# Patient Record
Sex: Female | Born: 1955 | Race: White | Hispanic: No | Smoking: Former smoker
Health system: Southern US, Community
[De-identification: ages and names within clinical notes are randomized; demographics above are authoritative.]

## PROBLEM LIST (undated history)

## (undated) DIAGNOSIS — K219 Gastro-esophageal reflux disease without esophagitis: Secondary | ICD-10-CM

## (undated) DIAGNOSIS — K589 Irritable bowel syndrome without diarrhea: Secondary | ICD-10-CM

## (undated) DIAGNOSIS — E7210 Disorders of sulfur-bearing amino-acid metabolism, unspecified: Secondary | ICD-10-CM

## (undated) DIAGNOSIS — J309 Allergic rhinitis, unspecified: Secondary | ICD-10-CM

## (undated) DIAGNOSIS — I10 Essential (primary) hypertension: Secondary | ICD-10-CM

## (undated) DIAGNOSIS — F988 Other specified behavioral and emotional disorders with onset usually occurring in childhood and adolescence: Secondary | ICD-10-CM

## (undated) DIAGNOSIS — R911 Solitary pulmonary nodule: Secondary | ICD-10-CM

## (undated) HISTORY — DX: Gastro-esophageal reflux disease without esophagitis: K21.9

## (undated) HISTORY — DX: Solitary pulmonary nodule: R91.1

## (undated) HISTORY — DX: Other specified behavioral and emotional disorders with onset usually occurring in childhood and adolescence: F98.8

## (undated) HISTORY — PX: ABDOMINAL HYSTERECTOMY: SHX81

## (undated) HISTORY — DX: Irritable bowel syndrome, unspecified: K58.9

## (undated) HISTORY — DX: Disorders of sulfur-bearing amino-acid metabolism, unspecified: E72.10

## (undated) HISTORY — DX: Essential (primary) hypertension: I10

## (undated) HISTORY — DX: Allergic rhinitis, unspecified: J30.9

---

## 1999-08-12 HISTORY — PX: OTHER SURGICAL HISTORY: SHX169

## 1999-11-28 ENCOUNTER — Other Ambulatory Visit: Admission: RE | Admit: 1999-11-28 | Discharge: 1999-11-28 | Payer: Self-pay | Admitting: Internal Medicine

## 1999-11-28 ENCOUNTER — Emergency Department (HOSPITAL_COMMUNITY): Admission: EM | Admit: 1999-11-28 | Discharge: 1999-11-28 | Payer: Self-pay | Admitting: Emergency Medicine

## 1999-11-28 ENCOUNTER — Encounter (INDEPENDENT_AMBULATORY_CARE_PROVIDER_SITE_OTHER): Payer: Self-pay | Admitting: Specialist

## 1999-11-28 ENCOUNTER — Encounter: Payer: Self-pay | Admitting: Internal Medicine

## 2000-12-23 ENCOUNTER — Emergency Department (HOSPITAL_COMMUNITY): Admission: EM | Admit: 2000-12-23 | Discharge: 2000-12-23 | Payer: Self-pay | Admitting: Emergency Medicine

## 2000-12-23 ENCOUNTER — Encounter: Payer: Self-pay | Admitting: Emergency Medicine

## 2003-07-31 ENCOUNTER — Other Ambulatory Visit: Admission: RE | Admit: 2003-07-31 | Discharge: 2003-07-31 | Payer: Self-pay | Admitting: Family Medicine

## 2003-07-31 ENCOUNTER — Encounter: Payer: Self-pay | Admitting: Internal Medicine

## 2003-08-09 ENCOUNTER — Ambulatory Visit (HOSPITAL_COMMUNITY): Admission: RE | Admit: 2003-08-09 | Discharge: 2003-08-09 | Payer: Self-pay | Admitting: Family Medicine

## 2003-08-14 ENCOUNTER — Emergency Department (HOSPITAL_COMMUNITY): Admission: EM | Admit: 2003-08-14 | Discharge: 2003-08-14 | Payer: Self-pay | Admitting: Emergency Medicine

## 2003-08-15 ENCOUNTER — Ambulatory Visit (HOSPITAL_COMMUNITY): Admission: RE | Admit: 2003-08-15 | Discharge: 2003-08-15 | Payer: Self-pay | Admitting: Family Medicine

## 2003-09-06 ENCOUNTER — Encounter: Admission: RE | Admit: 2003-09-06 | Discharge: 2003-09-06 | Payer: Self-pay | Admitting: Family Medicine

## 2004-11-27 ENCOUNTER — Ambulatory Visit: Payer: Self-pay | Admitting: Family Medicine

## 2004-12-17 ENCOUNTER — Ambulatory Visit: Payer: Self-pay | Admitting: Family Medicine

## 2004-12-17 ENCOUNTER — Other Ambulatory Visit: Admission: RE | Admit: 2004-12-17 | Discharge: 2004-12-17 | Payer: Self-pay | Admitting: Family Medicine

## 2004-12-19 LAB — CONVERTED CEMR LAB: Pap Smear: NORMAL

## 2005-03-11 ENCOUNTER — Ambulatory Visit: Payer: Self-pay | Admitting: Family Medicine

## 2005-04-07 ENCOUNTER — Ambulatory Visit: Payer: Self-pay | Admitting: Internal Medicine

## 2005-11-17 ENCOUNTER — Ambulatory Visit: Payer: Self-pay | Admitting: Family Medicine

## 2006-01-24 ENCOUNTER — Ambulatory Visit: Payer: Self-pay | Admitting: Family Medicine

## 2006-08-07 ENCOUNTER — Ambulatory Visit: Payer: Self-pay | Admitting: Internal Medicine

## 2006-08-20 ENCOUNTER — Ambulatory Visit: Payer: Self-pay | Admitting: Internal Medicine

## 2006-08-20 LAB — CONVERTED CEMR LAB
Chloride: 102 meq/L (ref 96–112)
Potassium: 4.2 meq/L (ref 3.5–5.3)
Sodium: 140 meq/L (ref 135–145)

## 2006-09-04 ENCOUNTER — Ambulatory Visit: Payer: Self-pay | Admitting: Internal Medicine

## 2006-09-18 ENCOUNTER — Ambulatory Visit: Payer: Self-pay | Admitting: Internal Medicine

## 2006-12-09 DIAGNOSIS — E7210 Disorders of sulfur-bearing amino-acid metabolism, unspecified: Secondary | ICD-10-CM | POA: Insufficient documentation

## 2006-12-09 DIAGNOSIS — K589 Irritable bowel syndrome without diarrhea: Secondary | ICD-10-CM | POA: Insufficient documentation

## 2006-12-09 DIAGNOSIS — K219 Gastro-esophageal reflux disease without esophagitis: Secondary | ICD-10-CM | POA: Insufficient documentation

## 2006-12-09 DIAGNOSIS — J309 Allergic rhinitis, unspecified: Secondary | ICD-10-CM | POA: Insufficient documentation

## 2006-12-09 DIAGNOSIS — I10 Essential (primary) hypertension: Secondary | ICD-10-CM | POA: Insufficient documentation

## 2006-12-11 ENCOUNTER — Ambulatory Visit: Payer: Self-pay | Admitting: Internal Medicine

## 2007-01-11 ENCOUNTER — Telehealth (INDEPENDENT_AMBULATORY_CARE_PROVIDER_SITE_OTHER): Payer: Self-pay | Admitting: *Deleted

## 2007-03-08 ENCOUNTER — Telehealth (INDEPENDENT_AMBULATORY_CARE_PROVIDER_SITE_OTHER): Payer: Self-pay | Admitting: *Deleted

## 2007-04-13 ENCOUNTER — Telehealth: Payer: Self-pay | Admitting: Internal Medicine

## 2007-04-14 ENCOUNTER — Ambulatory Visit: Payer: Self-pay | Admitting: Internal Medicine

## 2007-04-14 DIAGNOSIS — F988 Other specified behavioral and emotional disorders with onset usually occurring in childhood and adolescence: Secondary | ICD-10-CM | POA: Insufficient documentation

## 2007-05-19 ENCOUNTER — Telehealth (INDEPENDENT_AMBULATORY_CARE_PROVIDER_SITE_OTHER): Payer: Self-pay | Admitting: *Deleted

## 2007-06-29 ENCOUNTER — Telehealth (INDEPENDENT_AMBULATORY_CARE_PROVIDER_SITE_OTHER): Payer: Self-pay | Admitting: *Deleted

## 2007-07-01 ENCOUNTER — Telehealth (INDEPENDENT_AMBULATORY_CARE_PROVIDER_SITE_OTHER): Payer: Self-pay | Admitting: *Deleted

## 2007-08-06 ENCOUNTER — Telehealth (INDEPENDENT_AMBULATORY_CARE_PROVIDER_SITE_OTHER): Payer: Self-pay | Admitting: *Deleted

## 2007-08-13 ENCOUNTER — Telehealth: Payer: Self-pay | Admitting: Internal Medicine

## 2007-08-25 ENCOUNTER — Telehealth (INDEPENDENT_AMBULATORY_CARE_PROVIDER_SITE_OTHER): Payer: Self-pay | Admitting: *Deleted

## 2007-09-17 ENCOUNTER — Telehealth (INDEPENDENT_AMBULATORY_CARE_PROVIDER_SITE_OTHER): Payer: Self-pay | Admitting: *Deleted

## 2007-10-05 ENCOUNTER — Encounter (INDEPENDENT_AMBULATORY_CARE_PROVIDER_SITE_OTHER): Payer: Self-pay | Admitting: *Deleted

## 2007-10-05 ENCOUNTER — Telehealth (INDEPENDENT_AMBULATORY_CARE_PROVIDER_SITE_OTHER): Payer: Self-pay | Admitting: *Deleted

## 2007-10-12 ENCOUNTER — Telehealth (INDEPENDENT_AMBULATORY_CARE_PROVIDER_SITE_OTHER): Payer: Self-pay | Admitting: *Deleted

## 2007-11-01 ENCOUNTER — Encounter: Payer: Self-pay | Admitting: Internal Medicine

## 2007-11-01 ENCOUNTER — Telehealth (INDEPENDENT_AMBULATORY_CARE_PROVIDER_SITE_OTHER): Payer: Self-pay | Admitting: *Deleted

## 2007-11-09 ENCOUNTER — Telehealth (INDEPENDENT_AMBULATORY_CARE_PROVIDER_SITE_OTHER): Payer: Self-pay | Admitting: *Deleted

## 2007-11-12 ENCOUNTER — Telehealth (INDEPENDENT_AMBULATORY_CARE_PROVIDER_SITE_OTHER): Payer: Self-pay | Admitting: *Deleted

## 2007-11-15 ENCOUNTER — Telehealth (INDEPENDENT_AMBULATORY_CARE_PROVIDER_SITE_OTHER): Payer: Self-pay | Admitting: *Deleted

## 2007-11-29 ENCOUNTER — Ambulatory Visit: Payer: Self-pay | Admitting: Internal Medicine

## 2007-11-29 ENCOUNTER — Other Ambulatory Visit: Admission: RE | Admit: 2007-11-29 | Discharge: 2007-11-29 | Payer: Self-pay | Admitting: Internal Medicine

## 2007-11-29 ENCOUNTER — Encounter: Payer: Self-pay | Admitting: Internal Medicine

## 2007-11-29 LAB — CONVERTED CEMR LAB: Pap Smear: NORMAL

## 2007-11-30 ENCOUNTER — Encounter (INDEPENDENT_AMBULATORY_CARE_PROVIDER_SITE_OTHER): Payer: Self-pay | Admitting: *Deleted

## 2007-12-03 ENCOUNTER — Ambulatory Visit: Payer: Self-pay | Admitting: Internal Medicine

## 2007-12-06 ENCOUNTER — Encounter (INDEPENDENT_AMBULATORY_CARE_PROVIDER_SITE_OTHER): Payer: Self-pay | Admitting: *Deleted

## 2007-12-13 ENCOUNTER — Encounter (INDEPENDENT_AMBULATORY_CARE_PROVIDER_SITE_OTHER): Payer: Self-pay | Admitting: *Deleted

## 2008-01-05 ENCOUNTER — Encounter: Payer: Self-pay | Admitting: Internal Medicine

## 2008-01-05 ENCOUNTER — Ambulatory Visit: Payer: Self-pay | Admitting: Internal Medicine

## 2008-01-07 ENCOUNTER — Telehealth: Payer: Self-pay | Admitting: Internal Medicine

## 2008-01-11 ENCOUNTER — Telehealth (INDEPENDENT_AMBULATORY_CARE_PROVIDER_SITE_OTHER): Payer: Self-pay | Admitting: *Deleted

## 2008-01-12 ENCOUNTER — Encounter: Payer: Self-pay | Admitting: Internal Medicine

## 2008-01-13 ENCOUNTER — Telehealth: Payer: Self-pay | Admitting: Internal Medicine

## 2008-01-17 ENCOUNTER — Telehealth: Payer: Self-pay | Admitting: Internal Medicine

## 2008-01-17 ENCOUNTER — Encounter: Payer: Self-pay | Admitting: Internal Medicine

## 2008-01-18 ENCOUNTER — Encounter (INDEPENDENT_AMBULATORY_CARE_PROVIDER_SITE_OTHER): Payer: Self-pay | Admitting: *Deleted

## 2008-01-28 ENCOUNTER — Encounter: Payer: Self-pay | Admitting: Internal Medicine

## 2008-01-28 ENCOUNTER — Ambulatory Visit: Payer: Self-pay

## 2008-02-08 ENCOUNTER — Encounter: Payer: Self-pay | Admitting: Internal Medicine

## 2008-02-24 ENCOUNTER — Encounter: Payer: Self-pay | Admitting: Internal Medicine

## 2008-02-29 ENCOUNTER — Telehealth (INDEPENDENT_AMBULATORY_CARE_PROVIDER_SITE_OTHER): Payer: Self-pay | Admitting: *Deleted

## 2008-03-30 ENCOUNTER — Ambulatory Visit: Payer: Self-pay | Admitting: Internal Medicine

## 2008-03-30 DIAGNOSIS — L0292 Furuncle, unspecified: Secondary | ICD-10-CM | POA: Insufficient documentation

## 2008-03-30 DIAGNOSIS — L0293 Carbuncle, unspecified: Secondary | ICD-10-CM

## 2008-03-31 ENCOUNTER — Encounter: Payer: Self-pay | Admitting: Internal Medicine

## 2008-03-31 ENCOUNTER — Ambulatory Visit: Payer: Self-pay | Admitting: Internal Medicine

## 2008-04-03 ENCOUNTER — Telehealth: Payer: Self-pay | Admitting: Internal Medicine

## 2008-04-10 ENCOUNTER — Telehealth (INDEPENDENT_AMBULATORY_CARE_PROVIDER_SITE_OTHER): Payer: Self-pay | Admitting: *Deleted

## 2008-05-12 ENCOUNTER — Telehealth (INDEPENDENT_AMBULATORY_CARE_PROVIDER_SITE_OTHER): Payer: Self-pay | Admitting: *Deleted

## 2008-06-23 ENCOUNTER — Telehealth (INDEPENDENT_AMBULATORY_CARE_PROVIDER_SITE_OTHER): Payer: Self-pay | Admitting: *Deleted

## 2008-07-31 ENCOUNTER — Telehealth (INDEPENDENT_AMBULATORY_CARE_PROVIDER_SITE_OTHER): Payer: Self-pay | Admitting: *Deleted

## 2008-08-18 ENCOUNTER — Telehealth (INDEPENDENT_AMBULATORY_CARE_PROVIDER_SITE_OTHER): Payer: Self-pay | Admitting: *Deleted

## 2008-08-28 ENCOUNTER — Ambulatory Visit: Payer: Self-pay | Admitting: Internal Medicine

## 2008-10-10 ENCOUNTER — Encounter (INDEPENDENT_AMBULATORY_CARE_PROVIDER_SITE_OTHER): Payer: Self-pay | Admitting: *Deleted

## 2008-10-11 ENCOUNTER — Telehealth: Payer: Self-pay | Admitting: Internal Medicine

## 2008-10-23 ENCOUNTER — Ambulatory Visit: Payer: Self-pay | Admitting: Internal Medicine

## 2008-10-23 DIAGNOSIS — J019 Acute sinusitis, unspecified: Secondary | ICD-10-CM | POA: Insufficient documentation

## 2008-10-23 DIAGNOSIS — H6690 Otitis media, unspecified, unspecified ear: Secondary | ICD-10-CM | POA: Insufficient documentation

## 2008-10-24 ENCOUNTER — Encounter (INDEPENDENT_AMBULATORY_CARE_PROVIDER_SITE_OTHER): Payer: Self-pay | Admitting: *Deleted

## 2008-10-25 ENCOUNTER — Telehealth (INDEPENDENT_AMBULATORY_CARE_PROVIDER_SITE_OTHER): Payer: Self-pay | Admitting: *Deleted

## 2008-10-26 ENCOUNTER — Ambulatory Visit: Payer: Self-pay | Admitting: Family Medicine

## 2008-11-01 ENCOUNTER — Ambulatory Visit: Payer: Self-pay | Admitting: Internal Medicine

## 2008-11-08 ENCOUNTER — Ambulatory Visit: Payer: Self-pay | Admitting: Internal Medicine

## 2008-12-03 ENCOUNTER — Inpatient Hospital Stay (HOSPITAL_COMMUNITY): Admission: EM | Admit: 2008-12-03 | Discharge: 2008-12-06 | Payer: Self-pay | Admitting: Emergency Medicine

## 2008-12-03 ENCOUNTER — Ambulatory Visit: Payer: Self-pay | Admitting: Internal Medicine

## 2008-12-06 ENCOUNTER — Ambulatory Visit: Payer: Self-pay | Admitting: Vascular Surgery

## 2008-12-06 ENCOUNTER — Encounter: Payer: Self-pay | Admitting: Internal Medicine

## 2008-12-08 ENCOUNTER — Ambulatory Visit: Payer: Self-pay | Admitting: Family Medicine

## 2008-12-08 DIAGNOSIS — J029 Acute pharyngitis, unspecified: Secondary | ICD-10-CM | POA: Insufficient documentation

## 2008-12-09 ENCOUNTER — Encounter: Payer: Self-pay | Admitting: Family Medicine

## 2008-12-12 ENCOUNTER — Telehealth (INDEPENDENT_AMBULATORY_CARE_PROVIDER_SITE_OTHER): Payer: Self-pay | Admitting: *Deleted

## 2009-01-19 ENCOUNTER — Telehealth (INDEPENDENT_AMBULATORY_CARE_PROVIDER_SITE_OTHER): Payer: Self-pay | Admitting: *Deleted

## 2009-02-20 ENCOUNTER — Telehealth (INDEPENDENT_AMBULATORY_CARE_PROVIDER_SITE_OTHER): Payer: Self-pay | Admitting: *Deleted

## 2009-02-22 ENCOUNTER — Telehealth (INDEPENDENT_AMBULATORY_CARE_PROVIDER_SITE_OTHER): Payer: Self-pay | Admitting: *Deleted

## 2009-02-26 ENCOUNTER — Encounter: Payer: Self-pay | Admitting: Internal Medicine

## 2009-05-09 ENCOUNTER — Ambulatory Visit: Payer: Self-pay | Admitting: Internal Medicine

## 2009-05-09 DIAGNOSIS — R300 Dysuria: Secondary | ICD-10-CM | POA: Insufficient documentation

## 2009-05-09 DIAGNOSIS — N390 Urinary tract infection, site not specified: Secondary | ICD-10-CM | POA: Insufficient documentation

## 2009-05-09 LAB — CONVERTED CEMR LAB
Blood in Urine, dipstick: NEGATIVE
Ketones, urine, test strip: NEGATIVE
Nitrite: NEGATIVE
pH: 5

## 2009-05-14 ENCOUNTER — Ambulatory Visit: Payer: Self-pay | Admitting: Internal Medicine

## 2009-05-14 LAB — CONVERTED CEMR LAB
Albumin: 4.1 g/dL (ref 3.5–5.2)
BUN: 8 mg/dL (ref 6–23)
Basophils Absolute: 0 10*3/uL (ref 0.0–0.1)
CO2: 33 meq/L — ABNORMAL HIGH (ref 19–32)
Chloride: 105 meq/L (ref 96–112)
Cholesterol: 196 mg/dL (ref 0–200)
Glucose, Bld: 77 mg/dL (ref 70–99)
HCT: 41.1 % (ref 36.0–46.0)
HDL: 85.7 mg/dL (ref >39.00)
Hemoglobin, Urine: NEGATIVE
Lymphs Abs: 1.4 10*3/uL (ref 0.7–4.0)
MCHC: 34.3 g/dL (ref 30.0–36.0)
MCV: 90.9 fL (ref 78.0–100.0)
Monocytes Absolute: 0.4 10*3/uL (ref 0.1–1.0)
Platelets: 250 10*3/uL (ref 150.0–400.0)
Potassium: 3.3 meq/L — ABNORMAL LOW (ref 3.5–5.1)
RDW: 13.7 % (ref 11.5–14.6)
TSH: 1.53 microintl units/mL (ref 0.35–5.50)
Total Bilirubin: 0.7 mg/dL (ref 0.3–1.2)
Urine Glucose: NEGATIVE mg/dL
VLDL: 15 mg/dL (ref 0.0–40.0)
pH: 6 (ref 5.0–8.0)

## 2009-07-02 ENCOUNTER — Telehealth: Payer: Self-pay | Admitting: Internal Medicine

## 2009-07-03 ENCOUNTER — Telehealth: Payer: Self-pay | Admitting: Internal Medicine

## 2009-07-09 ENCOUNTER — Ambulatory Visit: Payer: Self-pay | Admitting: Internal Medicine

## 2009-07-31 ENCOUNTER — Telehealth: Payer: Self-pay | Admitting: Internal Medicine

## 2009-09-03 ENCOUNTER — Telehealth: Payer: Self-pay | Admitting: Internal Medicine

## 2009-09-10 ENCOUNTER — Encounter (INDEPENDENT_AMBULATORY_CARE_PROVIDER_SITE_OTHER): Payer: Self-pay | Admitting: *Deleted

## 2009-09-25 ENCOUNTER — Telehealth: Payer: Self-pay | Admitting: Internal Medicine

## 2009-10-08 ENCOUNTER — Telehealth: Payer: Self-pay | Admitting: Internal Medicine

## 2009-12-06 ENCOUNTER — Telehealth: Payer: Self-pay | Admitting: Internal Medicine

## 2009-12-28 ENCOUNTER — Ambulatory Visit: Payer: Self-pay | Admitting: Internal Medicine

## 2009-12-28 DIAGNOSIS — R059 Cough, unspecified: Secondary | ICD-10-CM | POA: Insufficient documentation

## 2009-12-28 DIAGNOSIS — R05 Cough: Secondary | ICD-10-CM

## 2010-01-29 ENCOUNTER — Telehealth: Payer: Self-pay | Admitting: Internal Medicine

## 2010-02-01 ENCOUNTER — Telehealth: Payer: Self-pay | Admitting: Internal Medicine

## 2010-03-20 ENCOUNTER — Telehealth: Payer: Self-pay | Admitting: Internal Medicine

## 2010-05-09 ENCOUNTER — Telehealth: Payer: Self-pay | Admitting: Internal Medicine

## 2010-05-10 ENCOUNTER — Telehealth: Payer: Self-pay | Admitting: Internal Medicine

## 2010-05-20 ENCOUNTER — Ambulatory Visit: Payer: Self-pay | Admitting: Internal Medicine

## 2010-05-20 LAB — CONVERTED CEMR LAB
Albumin: 3.7 g/dL (ref 3.5–5.2)
Basophils Relative: 0.5 % (ref 0.0–3.0)
CO2: 30 meq/L (ref 19–32)
Chloride: 104 meq/L (ref 96–112)
Cholesterol: 189 mg/dL (ref 0–200)
Eosinophils Absolute: 0.1 10*3/uL (ref 0.0–0.7)
Glucose, Bld: 78 mg/dL (ref 70–99)
HDL: 75.9 mg/dL (ref >39.00)
Hemoglobin, Urine: NEGATIVE
Hemoglobin: 12.8 g/dL (ref 12.0–15.0)
LDL Cholesterol: 101 mg/dL — ABNORMAL HIGH (ref 0–99)
Lymphs Abs: 1.4 10*3/uL (ref 0.7–4.0)
MCHC: 34.5 g/dL (ref 30.0–36.0)
MCV: 88.5 fL (ref 78.0–100.0)
Monocytes Absolute: 0.5 10*3/uL (ref 0.1–1.0)
Neutro Abs: 3.3 10*3/uL (ref 1.4–7.7)
Nitrite: NEGATIVE
RBC: 4.17 M/uL (ref 3.87–5.11)
Sodium: 141 meq/L (ref 135–145)
TSH: 1.06 microintl units/mL (ref 0.35–5.50)
Total CHOL/HDL Ratio: 2
Total Protein: 6.6 g/dL (ref 6.0–8.3)
Urine Glucose: NEGATIVE mg/dL
Urobilinogen, UA: 0.2 (ref 0.0–1.0)
VLDL: 12.4 mg/dL (ref 0.0–40.0)

## 2010-05-22 ENCOUNTER — Ambulatory Visit: Payer: Self-pay | Admitting: Internal Medicine

## 2010-05-22 ENCOUNTER — Encounter: Payer: Self-pay | Admitting: Internal Medicine

## 2010-07-01 ENCOUNTER — Telehealth: Payer: Self-pay | Admitting: Internal Medicine

## 2010-08-09 ENCOUNTER — Telehealth: Payer: Self-pay | Admitting: Internal Medicine

## 2010-08-10 ENCOUNTER — Encounter: Payer: Self-pay | Admitting: Internal Medicine

## 2010-08-10 LAB — HM MAMMOGRAPHY

## 2010-08-31 ENCOUNTER — Encounter: Payer: Self-pay | Admitting: Family Medicine

## 2010-09-08 LAB — CONVERTED CEMR LAB
BUN: 8 mg/dL (ref 6–23)
Blood in Urine, dipstick: NEGATIVE
Cholesterol: 217 mg/dL (ref 0–200)
Direct LDL: 111.7 mg/dL
Eosinophils Absolute: 0.1 10*3/uL (ref 0.0–0.7)
Eosinophils Relative: 1.4 % (ref 0.0–5.0)
GFR calc Af Amer: 114 mL/min
GFR calc non Af Amer: 95 mL/min
Glucose, Urine, Semiquant: NEGATIVE
HCT: 42.4 % (ref 36.0–46.0)
Ketones, urine, test strip: NEGATIVE
MCHC: 33.3 g/dL (ref 30.0–36.0)
MCV: 92.7 fL (ref 78.0–100.0)
Monocytes Absolute: 0.3 10*3/uL (ref 0.1–1.0)
Platelets: 277 10*3/uL (ref 150–400)
Potassium: 3.3 meq/L — ABNORMAL LOW (ref 3.5–5.1)
RDW: 13.6 % (ref 11.5–14.6)
Specific Gravity, Urine: <1.005
Triglycerides: 87 mg/dL (ref 0–149)
WBC: 3.6 10*3/uL — ABNORMAL LOW (ref 4.5–10.5)
pH: 8

## 2010-09-10 ENCOUNTER — Telehealth: Payer: Self-pay | Admitting: Internal Medicine

## 2010-09-10 NOTE — Progress Notes (Signed)
Summary: Adderall  Phone Note Call from Patient Call back at Work Phone [phone]   Caller: Patient Summary of Call: pt called requesting refills of Adderall Initial call taken by: Margaret Pyle, CMA,  October 08, 2009 1:47 PM  Follow-up for Phone Call        ok -please print and i will sign for pt's pick up Follow-up by: Newt Lukes MD,  October 08, 2009 2:23 PM  Additional Follow-up for Phone Call Additional follow up Details #1::        pt informed via personal VM. rx in cabinet for pick up Additional Follow-up by: Margaret Pyle, CMA,  October 08, 2009 2:28 PM    Prescriptions: ADDERALL 10 MG TABS (AMPHETAMINE-DEXTROAMPHETAMINE) 1-2 by mouth two times a day, maybe fill 09/03/2009  #120 x 0   Entered by:   Margaret Pyle, CMA   Authorized by:   Newt Lukes MD   Signed by:   Margaret Pyle, CMA on 10/08/2009   Method used:   Print then Give to Patient   RxID:   [id number]

## 2010-09-10 NOTE — Progress Notes (Signed)
Summary: Adderall/VAL pt  Phone Note Call from Patient Call back at Home Phone [phone]   Caller: Patient Summary of Call: Pt called requesting refills of Adderall  Initial call taken by: Margaret Pyle, CMA,  July 01, 2010 11:18 AM    New/Updated Medications: ADDERALL 10 MG TABS (AMPHETAMINE-DEXTROAMPHETAMINE) 1-2 by mouth two times a day - to fill Jul 01, 2010 Prescriptions: ADDERALL 10 MG TABS (AMPHETAMINE-DEXTROAMPHETAMINE) 1-2 by mouth two times a day - to fill Jul 01, 2010  #120 x 0   Entered and Authorized by:   Corwin Levins MD   Signed by:   Corwin Levins MD on 07/01/2010   Method used:   Print then Give to Patient   RxID:   [id number]  done hardcopy to LIM side B - dahlia Corwin Levins MD  July 01, 2010 1:02 PM   Rx informed, Rx in cabinet for ptpick up Margaret Pyle, CMA  July 01, 2010 1:10 PM

## 2010-09-10 NOTE — Progress Notes (Signed)
Summary: Adderall PA  Phone Note From Pharmacy   [facility]  [phone] Call For: ID:  [id number]  Summary of Call: PA request--Generic Adderall. Wait continued to get longer and longer--printed form from website. Initial call taken by: Lucious Groves,  September 03, 2009 2:33 PM  Follow-up for Phone Call        Patient called to find if pharmacy has sent over request for PA. Patient wanted to try and get done soon asap due to going out of town [employer]. Follow-up by: Rock Nephew CMA,  September 03, 2009 3:38 PM  Additional Follow-up for Phone Call Additional follow up Details #1::        form completed and awaiting signature.Lucious Groves,  September 03, 2009 3:55 PM  Signed and faxed to [facility].Lucious Groves,  September 03, 2009 3:57 PM  Per [facility] they do not handle the PA and will need to call employer bc they process there own PA's. Called pt/lmovm to call back, need to find out who her employer is and if there is a phone number on the back of the card that she has.Alvy Beal Archie CMA  September 03, 2009 4:18 PM     Additional Follow-up for Phone Call Additional follow up Details #2::    The patient prescription services are with [facility]. I spoke with [facility] at [phone] and there is not a specific form for the patient med. Per insurance company they need a dictated and signed letter of necessity faxed to them at [phone]. Follow-up by: Lucious Groves,  September 07, 2009 10:53 AM  Additional Follow-up for Phone Call Additional follow up Details #3:: Details for Additional Follow-up Action Taken: lucy - will you please generate such letter stating that it is medically necessary forp t to have this med as it is prescribed - i will then sign so we can fax - thanks! Newt Lukes MD  September 10, 2009 8:46 AM    Completed letter, faxed back to insurance company Additional Follow-up by: Orlan Leavens,  September 10, 2009 9:15 AM

## 2010-09-10 NOTE — Progress Notes (Signed)
Summary: Schedule Colonoscopy   Phone Note Outgoing Call Call back at [facility] Phone [phone]   Call placed by: Harlow Mares CMA Duncan Dull),  September 25, 2009 9:01 AM Call placed to: Patient Summary of Call: Left message on patients machine to call back. patient needs to schedule a colonoscopy Initial call taken by: Harlow Mares CMA Duncan Dull),  September 25, 2009 9:01 AM  Follow-up for Phone Call        patients mailbox is full and I can not leave a message.  Follow-up by: Harlow Mares CMA Duncan Dull),  October 03, 2009 12:33 PM

## 2010-09-10 NOTE — Progress Notes (Signed)
Summary: Adderall  Phone Note Call from Patient Call back at Work Phone [phone]   Caller: Patient Summary of Call: pt called requesting refill on Adderall Initial call taken by: Margaret Pyle, CMA,  September 03, 2009 10:35 AM  Follow-up for Phone Call        fine - print and i will sign for pick up Follow-up by: Newt Lukes MD,  September 03, 2009 10:53 AM  Additional Follow-up for Phone Call Additional follow up Details #1::        printed and awaiting MD signiture. Signed, pt informed. rx in cabinet for pt pick up Additional Follow-up by: Margaret Pyle, CMA,  September 03, 2009 10:57 AM    New/Updated Medications: ADDERALL 10 MG TABS (AMPHETAMINE-DEXTROAMPHETAMINE) 1-2 by mouth two times a day, maybe fill 09/03/2009 Prescriptions: ADDERALL 10 MG TABS (AMPHETAMINE-DEXTROAMPHETAMINE) 1-2 by mouth two times a day, maybe fill 09/03/2009  #120 x 0   Entered by:   Margaret Pyle, CMA   Authorized by:   Newt Lukes MD   Signed by:   Margaret Pyle, CMA on 09/03/2009   Method used:   Print then Give to Patient   RxID:   [id number]

## 2010-09-10 NOTE — Assessment & Plan Note (Signed)
Summary: COUGH/NWS   Vital Signs:  Patient profile:   55 year old female Height:      66.5 inches (168.91 cm) Weight:      151.0 pounds (68.64 kg) BMI:     24.09 O2 Sat:      97 % on Room air Temp:     97.9 degrees F (36.61 degrees C) oral Pulse rate:   85 / minute BP sitting:   120 / 84  (left arm) Cuff size:   regular  Vitals Entered By: Orlan Leavens (Dec 28, 2009 2:49 PM)  O2 Flow:  Room air CC: cough, Cough Is Patient Diabetic? No Pain Assessment Patient in pain? no        Primary Care Provider:  Newt Lukes MD  CC:  cough and Cough.  History of Present Illness:  Cough      This is a 55 year old woman who presents with Cough.  The symptoms began 2 weeks ago.  The intensity is described as moderate.  began as "cold" but now residual dry cough - no sputum or fever. +sick exposure with son recently hosp at [facility] for CF and psuedomonas resp illness.  The patient reports non-productive cough and malaise, but denies pleuritic chest pain, shortness of breath, wheezing, exertional dyspnea, fever, and hemoptysis.  Associated symtpoms include cold/URI symptoms, sore throat, and acid reflux symptoms.  The patient denies the following symptoms: nasal congestion, chronic rhinitis, weight loss, and peripheral edema.  The cough is worse with activity and lying down.  Ineffective prior treatments have included OTC cough medication, throat lozenges, and PPI.  Risk factors include history of allergic rhinitis and history of reflux.    Current Medications (verified): 1)  Adderall 10 Mg Tabs (Amphetamine-Dextroamphetamine) .Marland Kitchen.. 1-2 By Mouth Two Times A Day 2)  Maxzide-25 37.5-25 Mg Tabs (Triamterene-Hctz) .Marland Kitchen.. 1 By Mouth Once Daily - Due Office Visit For Additional Refills 3)  Verapamil Hcl 120 Mg  Tabs (Verapamil Hcl) .Marland Kitchen.. 1 By Mouth Two Times A Day - Due Office Visit For Additional Refills 4)  Estradiol 1 Mg  Tabs (Estradiol) .... 1.5  By Mouth Once Daily - Due Office Visit For  Additional Refills 5)  Valtrex 1 Gm  Tabs (Valacyclovir Hcl) .... 2 Pills Two Times A Day For Fever Blister Prn 6)  Prilosec Otc 20 Mg  Tbec (Omeprazole Magnesium) .... Take 1 Tablet By Mouth Once A Day As Needed  Allergies (verified): 1)  ! Procardia (Nifedipine)  Past History:  Past Medical History: GERD ADD HYPERTENSION   HOMOCYSTINEMIA  IBS--remote h/o  OSTEOPENIA ALLERGIC RHINITIS Fever blisters (recurrent , on Valtrex)   Raynauds snake bite right 2nd toe April 2010  Review of Systems  The patient denies fever, weight loss, chest pain, syncope, headaches, and hemoptysis.    Physical Exam  General:  alert, well-developed, well-nourished, and cooperative to examination.   nontoxic but continuous dry cough Eyes:  vision grossly intact; pupils equal, round and reactive to light.  conjunctiva and lids normal.    Ears:  normal pinnae bilaterally, without erythema, swelling, or tenderness to palpation. TMs clear, without effusion, or cerumen impaction. Hearing grossly normal bilaterally  Mouth:  teeth and gums in good repair; mucous membranes moist, without lesions or ulcers. oropharynx clear without exudate, mod erythema. no PND Neck:  supple, full ROM, no masses, no thyromegaly; no thyroid nodules or tenderness. no JVD or carotid bruits.   Lungs:  normal respiratory effort, no intercostal retractions or use of  accessory muscles; normal breath sounds bilaterally - no crackles and no wheezes.    Heart:  normal rate, regular rhythm, no murmur, and no rub. BLE without edema.    Impression & Recommendations:  Problem # 1:  COUGH (ICD-786.2)  residual symptoms (dry cough) following probable viral illness - tx with aggressive cough suppression - codiene, tessalon, max PPI and voice rest - if develops worsening symptoms or fever, can reconsider antibiotics but it does not appear necessary to use any anitbiotic at this time  pt educated on same - agrees  Complete Medication  List: 1)  Adderall 10 Mg Tabs (Amphetamine-dextroamphetamine) .Marland Kitchen.. 1-2 by mouth two times a day 2)  Maxzide-25 37.5-25 Mg Tabs (Triamterene-hctz) .Marland Kitchen.. 1 by mouth once daily - due office visit for additional refills 3)  Verapamil Hcl 120 Mg Tabs (Verapamil hcl) .Marland Kitchen.. 1 by mouth two times a day - due office visit for additional refills 4)  Estradiol 1 Mg Tabs (Estradiol) .... 1.5  by mouth once daily - due office visit for additional refills 5)  Valtrex 1 Gm Tabs (Valacyclovir hcl) .... 2 pills two times a day for fever blister prn 6)  Prilosec Otc 20 Mg Tbec (Omeprazole magnesium) .... Take 1 tablet by mouth once a day as needed 7)  Cheratussin Ac 100-10 Mg/64ml Syrp (Guaifenesin-codeine) .Marland Kitchen.. 1-2 tsp by mouth three times daily x 2 days, then every 6 hours as needed for cough 8)  Tessalon 200 Mg Caps (Benzonatate) .Marland Kitchen.. 1 by mouth three times a day x 5 days, then as needed for cough  Patient Instructions: 1)  cough suppression as discussed: 2)  1)  tessalon pearls 200mg  3x/day x 5days and can use every 6hrs during day if needed.. 3)  2) increase prilosec to two times a day x 5 days, then resume once daily if cough improved 4)  3) codiene cough syrup three times a day x 2 days, then every 6 hours as needed  5)  3)  hard candy to bathe back of throat during day, no throat clearing 6)  4)  limit voice use, no singing for next 2 weeks.  7)  Please schedule a follow-up appointment as needed. Prescriptions: CHERATUSSIN AC 100-10 MG/5ML SYRP (GUAIFENESIN-CODEINE) 1-2 tsp by mouth three times daily x 2 days, then every 6 hours as needed for cough  #200cc x 0   Entered and Authorized by:   Newt Lukes MD   Signed by:   Newt Lukes MD on 12/28/2009   Method used:   Print then Give to Patient   RxID:   [id number] TESSALON 200 MG CAPS (BENZONATATE) 1 by mouth three times a day x 5 days, then as needed for cough  #30 x 1   Entered and Authorized by:   Newt Lukes MD   Signed by:    Newt Lukes MD on 12/28/2009   Method used:   Print then Give to Patient   RxID:   [id number]

## 2010-09-10 NOTE — Progress Notes (Signed)
Summary: Rx refill req  Phone Note Call from Patient Call back at Home Phone [phone]   Caller: Patient Summary of Call: Pt called stating that [facility] appt was made for 05/14/2010 and is reqeusting refills of medications that were denied yesterday. Rx sent and pt notified Initial call taken by: Margaret Pyle, CMA,  May 10, 2010 8:32 AM    Prescriptions: ESTRADIOL 1 MG  TABS (ESTRADIOL) 1.5  by mouth once daily - DUE OFFICE VISIT FOR ADDITIONAL REFILLS  #45 x 0   Entered by:   Margaret Pyle, CMA   Authorized by:   Newt Lukes MD   Signed by:   Margaret Pyle, CMA on 05/10/2010   Method used:   Electronically to        [facility] [id number] (retail)       [address] [place], [place]  [address]       Ph: [phone]       Fax: [phone]   RxID:   [id number] VERAPAMIL HCL 120 MG  TABS (VERAPAMIL HCL) 1 by mouth two times a day - DUE OFFICE VISIT FOR ADDITIONAL REFILLS  #60 x 0   Entered by:   Margaret Pyle, CMA   Authorized by:   Newt Lukes MD   Signed by:   Margaret Pyle, CMA on 05/10/2010   Method used:   Electronically to        [facility] # [phone]* (retail)       [address] [place], [place]  [address]       Ph: [phone]       Fax: [phone]   RxID:   [id number] MAXZIDE-25 37.5-25 MG TABS (TRIAMTERENE-HCTZ) 1 by mouth once daily - DUE OFFICE VISIT FOR ADDITIONAL REFILLS  #30 x 0   Entered by:   Margaret Pyle, CMA   Authorized by:   Newt Lukes MD   Signed by:   Margaret Pyle, CMA on 05/10/2010   Method used:   Electronically to        [facility] # [phone]* (retail)       [address] [place], [place]  [address]       Ph: [phone]       Fax: [phone]   RxID:   [id number]

## 2010-09-10 NOTE — Progress Notes (Signed)
Summary: Adderall  Phone Note Call from Patient Call back at Home Phone [phone]   Caller: Patient Summary of Call: pt called requesting refill of Adderall Initial call taken by: Margaret Pyle, CMA,  December 06, 2009 11:23 AM  Follow-up for Phone Call        ok - done Follow-up by: Newt Lukes MD,  December 06, 2009 11:51 AM  Additional Follow-up for Phone Call Additional follow up Details #1::        pt informed, Rx in cabinet for pick up Additional Follow-up by: Margaret Pyle, CMA,  December 06, 2009 11:57 AM    New/Updated Medications: ADDERALL 10 MG TABS (AMPHETAMINE-DEXTROAMPHETAMINE) 1-2 by mouth two times a day Prescriptions: ADDERALL 10 MG TABS (AMPHETAMINE-DEXTROAMPHETAMINE) 1-2 by mouth two times a day  #120 x 0   Entered and Authorized by:   Newt Lukes MD   Signed by:   Newt Lukes MD on 12/06/2009   Method used:   Print then Give to Patient   RxID:   [id number]

## 2010-09-10 NOTE — Assessment & Plan Note (Signed)
Summary: CPX/ NWS   Vital Signs:  Patient profile:   55 year old female Height:      66.5 inches (168.91 cm) Weight:      152.12 pounds (69.15 kg) O2 Sat:      98 % on Room air Temp:     98.6 degrees F (37.00 degrees C) oral Pulse rate:   73 / minute BP sitting:   124 / 82  (left arm) Cuff size:   regular  Vitals Entered By: Orlan Leavens RMA (May 22, 2010 3:37 PM)  O2 Flow:  Room air CC: CPX Is Patient Diabetic? No Pain Assessment Patient in pain? no      Comments Need renewal on all meds   Primary Care Provider:  Newt Lukes MD  CC:  CPX.  History of Present Illness: patient is here today for annual physical. Patient feels well and has no complaints.   Preventive Screening-Counseling & Management  Alcohol-Tobacco     Alcohol drinks/day: 1     Alcohol type: wine     Smoking Status: never     Smoking Cessation Counseling: yes     Passive Smoke Exposure: no  Caffeine-Diet-Exercise     Diet Counseling: not indicated; diet is assessed to be healthy     Does Patient Exercise: no     Exercise Counseling: to improve exercise regimen     Depression Counseling: not indicated; screening negative for depression  Safety-Violence-Falls     Seat Belt Counseling: not indicated; patient wears seat belts     Firearm Counseling: not applicable     Violence Counseling: not indicated; no violence risk noted     Fall Risk Counseling: not indicated; no significant falls noted  Clinical Review Panels:  Prevention   Last Mammogram:  normal (12/03/2007)   Last Pap Smear:  normal (11/29/2007)   Last Colonoscopy:  normal (11/28/1999)  Immunizations   Last Tetanus Booster:  Historical (11/09/2008)   Last Flu Vaccine:  Fluvax 3+ (05/22/2010)  Lipid Management   Cholesterol:  189 (05/20/2010)   LDL (bad choesterol):  101 (05/20/2010)   HDL (good cholesterol):  75.90 (05/20/2010)  CBC   WBC:  5.2 (05/20/2010)   RBC:  4.17 (05/20/2010)   Hgb:  12.8 (05/20/2010)  Hct:  36.9 (05/20/2010)   Platelets:  263.0 (05/20/2010)   MCV  88.5 (05/20/2010)   MCHC  34.5 (05/20/2010)   RDW  15.0 (05/20/2010)   PMN:  63.3 (05/20/2010)   Lymphs:  25.9 (05/20/2010)   Monos:  8.8 (05/20/2010)   Eosinophils:  1.5 (05/20/2010)   Basophil:  0.5 (05/20/2010)  Complete Metabolic Panel   Glucose:  78 (05/20/2010)   Sodium:  141 (05/20/2010)   Potassium:  4.3 (05/20/2010)   Chloride:  104 (05/20/2010)   CO2:  30 (05/20/2010)   BUN:  13 (05/20/2010)   Creatinine:  0.7 (05/20/2010)   Albumin:  3.7 (05/20/2010)   Total Protein:  6.6 (05/20/2010)   Calcium:  9.2 (05/20/2010)   Total Bili:  0.5 (05/20/2010)   Alk Phos:  63 (05/20/2010)   SGPT (ALT):  21 (05/20/2010)   SGOT (AST):  26 (05/20/2010)   Current Medications (verified): 1)  Adderall 10 Mg Tabs (Amphetamine-Dextroamphetamine) .Marland Kitchen.. 1-2 By Mouth Two Times A Day 2)  Maxzide-25 37.5-25 Mg Tabs (Triamterene-Hctz) .Marland Kitchen.. 1 By Mouth Once Daily - Due Office Visit For Additional Refills 3)  Verapamil Hcl 120 Mg  Tabs (Verapamil Hcl) .Marland Kitchen.. 1 By Mouth Two Times A Day - Due Office  Visit For Additional Refills 4)  Estradiol 1 Mg  Tabs (Estradiol) .... 1.5  By Mouth Once Daily - Due Office Visit For Additional Refills 5)  Valtrex 1 Gm  Tabs (Valacyclovir Hcl) .... 2 Pills Two Times A Day For Fever Blister Prn 6)  Prilosec Otc 20 Mg  Tbec (Omeprazole Magnesium) .... Take 1 Tablet By Mouth Once A Day As Needed  Allergies (verified): 1)  ! Procardia (Nifedipine)  Past History:  Past medical, surgical, family and social histories (including risk factors) reviewed, and no changes noted (except as noted below).  Past Medical History: GERD ADD HYPERTENSION   HOMOCYSTINEMIA  IBS--remote h/o  OSTEOPENIA ALLERGIC RHINITIS Fever blisters (recurrent , on Valtrex)   Raynauds snake bite right 2nd toe April 2010  MD roster: Michiel Sites  Past Surgical History: Reviewed history from 05/09/2009 and no changes  required. Hysterectomy (2001) + bladder tack - dr. Janae Sauce  Family History: Reviewed history from 11/29/2007 and no changes required. CAD - M, F, M aunts HTN - M, F, MGM, MGF, PGF, PGM DM - F stroke - F, P Uncles & aunts, M aunts & uncles breast ca - no  colon Ca - PGF, MGF, M cousin  Social History: Reviewed history from 05/09/2009 and no changes required. 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 I have reviewed all other systems and they were negative.   Physical Exam  General:  alert, well-developed, well-nourished, and cooperative to examination.    Eyes:  vision grossly intact; pupils equal, round and reactive to light.  conjunctiva and lids normal.    Ears:  normal pinnae bilaterally, without erythema, swelling, or tenderness to palpation. TMs clear, without effusion, or cerumen impaction. Hearing grossly normal bilaterally  Mouth:  teeth and gums in good repair; mucous membranes moist, without lesions or ulcers. oropharynx clear without exudate, no erythema.  Neck:  supple, full ROM, no masses, no thyromegaly; no thyroid nodules or tenderness. no JVD or carotid bruits.   Lungs:  normal respiratory effort, no intercostal retractions or use of accessory muscles; normal breath sounds bilaterally - no crackles and no wheezes.    Heart:  normal rate, regular rhythm, no murmur, and no rub. BLE without edema.  Abdomen:  soft, non-tender, normal bowel sounds, no distention; no masses and no appreciable hepatomegaly or splenomegaly.   Genitalia:  defer to gyn Msk:  chronic swelling on right 2nd toe due to scarring s/p snake bite 4/10 - no joint effusion at ankle or knee or digits - no gross deformities elsewhere Neurologic:  alert & oriented X3 and cranial nerves II-XII symetrically intact.   strength normal in all extremities, sensation intact to light touch, and gait normal. speech fluent without dysarthria or aphasia; follows commands with good comprehension.  Skin:  no rashes, vesicles, ulcers, or erythema. No nodules or irregularity to palpation.  Psych:  Oriented X3, memory intact for recent and remote, normally interactive, good eye contact, not anxious appearing, not depressed appearing, and not agitated.      Impression & Recommendations:  Problem # 1:  PREVENTIVE HEALTH CARE (ICD-V70.0) Patient has been counseled on age-appropriate routine health concerns for screening and prevention. These are reviewed and up-to-date. Immunizations are up-to-date or declined. Labs and ECG reviewed.  Orders: EKG w/ Interpretation (  93000)  Complete Medication List: 1)  Adderall 10 Mg Tabs (Amphetamine-dextroamphetamine) .Marland Kitchen.. 1-2 by mouth two times a day 2)  Maxzide-25 37.5-25 Mg Tabs (Triamterene-hctz) .Marland Kitchen.. 1 by mouth once daily 3)  Verapamil Hcl 120 Mg Tabs (Verapamil hcl) .Marland Kitchen.. 1 by mouth two times a day 4)  Estradiol 1 Mg Tabs (Estradiol) .... 1.5  by mouth once daily 5)  Valtrex 1 Gm Tabs (Valacyclovir hcl) .... 2 pills two times a day for fever blister as needed 6)  Prilosec Otc 20 Mg Tbec (Omeprazole magnesium) .... Take 1 tablet by mouth once a day as needed  Other Orders: Admin 1st Vaccine ([address]) Flu Vaccine 4yrs + (60454)  Patient Instructions: 1)  it was good to see you today. 2)  exam, labs and EKG reviewed -all look great! keep up the good work! 3)  followup with gynecology as discussed for pelvic exam and bladder surg followup 4)  refills today 5)  Please schedule a follow-up appointment annually for medical physical and labs, call sooner if problems.  Prescriptions: ADDERALL 10 MG TABS (AMPHETAMINE-DEXTROAMPHETAMINE) 1-2 by mouth two times a day  #120 x 0   Entered and Authorized by:   Newt Lukes MD   Signed by:   Newt Lukes MD on 05/22/2010    Method used:   Print then Give to Patient   RxID:   [id number] VALTREX 1 GM  TABS (VALACYCLOVIR HCL) 2 pills two times a day for fever blister PRN  #120 x 2   Entered by:   Orlan Leavens RMA   Authorized by:   Newt Lukes MD   Signed by:   Orlan Leavens RMA on 05/22/2010   Method used:   Electronically to        [facility] # [phone]* (retail)       [address] [place], [place]  [address]       Ph: [phone]       Fax: [phone]   RxID:   [id number] ESTRADIOL 1 MG  TABS (ESTRADIOL) 1.5  by mouth once daily - DUE OFFICE VISIT FOR ADDITIONAL REFILLS  #45 x 11   Entered by:   Orlan Leavens RMA   Authorized by:   Newt Lukes MD   Signed by:   Orlan Leavens RMA on 05/22/2010   Method used:   Electronically to        [facility] # [phone]* (retail)       [address] [place], [place]  [address]       Ph: [phone]       Fax: [phone]   RxID:   [id number] VERAPAMIL HCL 120 MG  TABS (VERAPAMIL HCL) 1 by mouth two times a day - DUE OFFICE VISIT FOR ADDITIONAL REFILLS  #60 x 11   Entered by:   Orlan Leavens RMA   Authorized by:   Newt Lukes MD   Signed by:   Orlan Leavens RMA on 05/22/2010   Method used:   Electronically to        [facility] # [phone]* (retail)       [address] [place], [place]  [address]       Ph: [phone]       Fax: [phone]   RxID:   [id number] MAXZIDE-25 37.5-25 MG TABS (TRIAMTERENE-HCTZ) 1 by mouth once daily -  DUE OFFICE VISIT FOR ADDITIONAL REFILLS  #30 x 11   Entered by:   Orlan Leavens RMA   Authorized by:   Newt Lukes MD   Signed by:   Orlan Leavens RMA on 05/22/2010   Method used:   Electronically to        [facility] # [phone]* (retail)       [address] [place], [place]  [address]       Ph: [phone]       Fax: [phone]   RxID:   [id number]  Flu Vaccine Consent Questions     Do you have a history of severe allergic reactions to this vaccine?  no    Any prior history of allergic reactions to egg and/or gelatin? no    Do you have a sensitivity to the preservative Thimersol? no    Do you have a past history of Guillan-Barre Syndrome? no    Do you currently have an acute febrile illness? no    Have you ever had a severe reaction to latex? no    Vaccine information given and explained to patient? yes    Are you currently pregnant? no    Lot Number:[id number]   Exp Date:02/08/2011   Site Given  Left Deltoid IM

## 2010-09-10 NOTE — Progress Notes (Signed)
Summary: Adderall  Phone Note Call from Patient Call back at Home Phone [phone]   Caller: Patient Summary of Call: Pt called requesting refill of Adderal 10mg  Initial call taken by: Margaret Pyle, CMA,  February 01, 2010 2:19 PM  Follow-up for Phone Call        ok- done Follow-up by: Newt Lukes MD,  February 01, 2010 3:05 PM  Additional Follow-up for Phone Call Additional follow up Details #1::        Pt informed, Rx in cabinet for pt pick up Additional Follow-up by: Margaret Pyle, CMA,  February 01, 2010 3:30 PM    Prescriptions: ADDERALL 10 MG TABS (AMPHETAMINE-DEXTROAMPHETAMINE) 1-2 by mouth two times a day  #120 x 0   Entered and Authorized by:   Newt Lukes MD   Signed by:   Newt Lukes MD on 02/01/2010   Method used:   Print then Give to Patient   RxID:   [id number]

## 2010-09-10 NOTE — Progress Notes (Signed)
Summary: Adderall  Phone Note Call from Patient Call back at Home Phone [phone]   Caller: Patient Summary of Call: Pt called requesting refill of Adderall. Okay to generate Rx? Initial call taken by: Margaret Pyle, CMA,  March 20, 2010 2:46 PM  Follow-up for Phone Call        ok - i will sign  Newt Lukes MD  March 20, 2010 3:11 PM   Additional Follow-up for Phone Call Additional follow up Details #1::        Pt informed, Rx in cabinet for pt pick up Additional Follow-up by: Margaret Pyle, CMA,  March 20, 2010 3:48 PM    Prescriptions: ADDERALL 10 MG TABS (AMPHETAMINE-DEXTROAMPHETAMINE) 1-2 by mouth two times a day  #120 x 0   Entered by:   Margaret Pyle, CMA   Authorized by:   Newt Lukes MD   Signed by:   Margaret Pyle, CMA on 03/20/2010   Method used:   Print then Give to Patient   RxID:   [id number]

## 2010-09-10 NOTE — Progress Notes (Signed)
Summary: refills  Phone Note Refill Request Message from:  Fax from Pharmacy on January 29, 2010 11:56 AM  Refills Requested: Medication #1:  MAXZIDE-25 37.5-25 MG TABS 1 by mouth once daily -   Last Refilled: 10/22/2009  Medication #2:  ESTRADIOL 1 MG  TABS 1.5  by mouth once daily -   Last Refilled: 10/22/2009  Medication #3:  VERAPAMIL HCL 120 MG  TABS 1 by mouth two times a day - DUE   Last Refilled: [facility]/22/[phone] Last ov 12/28/09  Initial call taken by: Orlan Leavens,  January 29, 2010 12:00 PM    Prescriptions: ESTRADIOL 1 MG  TABS (ESTRADIOL) 1.5  by mouth once daily - DUE OFFICE VISIT FOR ADDITIONAL REFILLS  #135 x 0   Entered by:   Orlan Leavens   Authorized by:   Newt Lukes MD   Signed by:   Orlan Leavens on 01/29/2010   Method used:   Electronically to        [facility] # [phone]* (retail)       [address] [place], [place]  [address]       Ph: [phone]       Fax: [phone]   RxID:   [id number] VERAPAMIL HCL 120 MG  TABS (VERAPAMIL HCL) 1 by mouth two times a day - DUE OFFICE VISIT FOR ADDITIONAL REFILLS  #180 x 0   Entered by:   Orlan Leavens   Authorized by:   Newt Lukes MD   Signed by:   Orlan Leavens on 01/29/2010   Method used:   Electronically to        [facility] # [phone]* (retail)       [address] [place], [place]  [address]       Ph: [phone]       Fax: [phone]   RxID:   [id number] MAXZIDE-25 37.5-25 MG TABS (TRIAMTERENE-HCTZ) 1 by mouth once daily - DUE OFFICE VISIT FOR ADDITIONAL REFILLS  #90 x 0   Entered by:   Orlan Leavens   Authorized by:   Newt Lukes MD   Signed by:   Orlan Leavens on 01/29/2010   Method used:   Electronically to        [facility] # [phone]* (retail)       [address] [place], [place]  [address]       Ph: [phone]       Fax: [phone]   RxID:   [id number]

## 2010-09-10 NOTE — Progress Notes (Signed)
Summary: med refills  Phone Note Refill Request Message from:  Fax from Pharmacy on May 09, 2010 11:47 AM  Refills Requested: Medication #1:  ESTRADIOL 1 MG  TABS 1.5  by mouth once daily - DUE OFFICE VISIT FOR ADDITIONAL REFILLS  Medication #2:  VERAPAMIL HCL 120 MG  TABS 1 by mouth two times a day - DUE OFFICE VISIT FOR ADDITIONAL REFILLS  Medication #3:  MAXZIDE-25 37.5-25 MG TABS 1 by mouth once daily - DUE OFFICE VISIT FOR ADDITIONAL REFILLS [facility]  Initial call taken by: Orlan Leavens RMA,  May 09, 2010 11:47 AM  Follow-up for Phone Call        Faxed back paper req denied pt is overdue for appt must see md before approval Follow-up by: Orlan Leavens RMA,  May 09, 2010 11:48 AM

## 2010-09-10 NOTE — Letter (Signed)
Summary: Generic Letter  [facility]  [address] [place], [place] [address]   Phone: [phone]  Fax: [phone]    09/10/2009  BRAYLON LEMMONS [address] [place], [place]  [address]  To whom is concern,  It is medically necessary for patient to take Adderal 10mg  1-2 twice a day.    Sincerely,   Dr. Rene Paci

## 2010-09-12 NOTE — Progress Notes (Signed)
Summary: ADDERALL  Phone Note Call from Patient Call back at [facility] Phone [phone]   Caller: Patient Summary of Call: NEEDS REFILL ON ADDERALL 10 MG  [phone]  PT'S PHONE Initial call taken by: Hilarie Fredrickson,  August 09, 2010 10:45 AM  Follow-up for Phone Call        ok - rx printed and signed - given to triage b Follow-up by: Newt Lukes MD,  August 09, 2010 12:26 PM  Additional Follow-up for Phone Call Additional follow up Details #1::        Pt advised, Rx in cabinet for pt pick up Additional Follow-up by: Margaret Pyle, CMA,  August 09, 2010 1:52 PM    New/Updated Medications: ADDERALL 10 MG TABS (AMPHETAMINE-DEXTROAMPHETAMINE) 1-2 by mouth two times a day - Prescriptions: ADDERALL 10 MG TABS (AMPHETAMINE-DEXTROAMPHETAMINE) 1-2 by mouth two times a day -  #120 x 0   Entered and Authorized by:   Newt Lukes MD   Signed by:   Newt Lukes MD on 08/09/2010   Method used:   Print then Give to Patient   RxID:   [id number]

## 2010-09-18 NOTE — Progress Notes (Signed)
Summary: Adderall  Phone Note Call from Patient Call back at Home Phone [phone]   Caller: Patient Summary of Call: Pt called requesting refill of Adderall Initial call taken by: Margaret Pyle, CMA,  September 10, 2010 1:07 PM  Follow-up for Phone Call        ok  Follow-up by: Newt Lukes MD,  September 10, 2010 1:13 PM  Additional Follow-up for Phone Call Additional follow up Details #1::        Pt informed, Rx in cabinet for pt pick up Additional Follow-up by: Margaret Pyle, CMA,  September 10, 2010 2:06 PM    Prescriptions: ADDERALL 10 MG TABS (AMPHETAMINE-DEXTROAMPHETAMINE) 1-2 by mouth two times a day -  #120 x 0   Entered and Authorized by:   Newt Lukes MD   Signed by:   Newt Lukes MD on 09/10/2010   Method used:   Print then Give to Patient   RxID:   [id number]

## 2010-10-16 ENCOUNTER — Telehealth: Payer: Self-pay | Admitting: Internal Medicine

## 2010-10-22 NOTE — Progress Notes (Signed)
Summary: Adderall  Phone Note Call from Patient Call back at Home Phone [phone]   Caller: Patient Summary of Call: Pt called requesting refill of Adderall. Okay to print Rx for signature? Initial call taken by: Margaret Pyle, CMA,  October 16, 2010 4:28 PM  Follow-up for Phone Call        yes Follow-up by: Newt Lukes MD,  October 16, 2010 5:28 PM  Additional Follow-up for Phone Call Additional follow up Details #1::        Pt advised, Rx in cabinet for pt pick up Additional Follow-up by: Margaret Pyle, CMA,  October 17, 2010 8:57 AM    Prescriptions: ADDERALL 10 MG TABS (AMPHETAMINE-DEXTROAMPHETAMINE) 1-2 by mouth two times a day -  #120 x 0   Entered by:   Margaret Pyle, CMA   Authorized by:   Newt Lukes MD   Signed by:   Margaret Pyle, CMA on 10/17/2010   Method used:   Print then Give to Patient   RxID:   [id number]

## 2010-11-20 ENCOUNTER — Other Ambulatory Visit: Payer: Self-pay

## 2010-11-20 LAB — URINALYSIS, ROUTINE W REFLEX MICROSCOPIC
Bilirubin Urine: NEGATIVE
Hgb urine dipstick: NEGATIVE
Ketones, ur: NEGATIVE mg/dL
Nitrite: NEGATIVE
Protein, ur: NEGATIVE mg/dL
Specific Gravity, Urine: 1.015 (ref 1.005–1.030)
Urobilinogen, UA: 0.2 mg/dL (ref 0.0–1.0)

## 2010-11-20 LAB — DIFFERENTIAL
Basophils Absolute: 0 10*3/uL (ref 0.0–0.1)
Basophils Absolute: 0 10*3/uL (ref 0.0–0.1)
Basophils Relative: 0 % (ref 0–1)
Eosinophils Absolute: 0 10*3/uL (ref 0.0–0.7)
Eosinophils Relative: 2 % (ref 0–5)
Lymphocytes Relative: 21 % (ref 12–46)
Monocytes Absolute: 0.5 10*3/uL (ref 0.1–1.0)
Monocytes Relative: 12 % (ref 3–12)
Neutro Abs: 2.5 10*3/uL (ref 1.7–7.7)
Neutrophils Relative %: 67 % (ref 43–77)

## 2010-11-20 LAB — CBC
HCT: 35.1 % — ABNORMAL LOW (ref 36.0–46.0)
HCT: 35.8 % — ABNORMAL LOW (ref 36.0–46.0)
Hemoglobin: 12.2 g/dL (ref 12.0–15.0)
MCHC: 34 g/dL (ref 30.0–36.0)
MCHC: 34.8 g/dL (ref 30.0–36.0)
MCV: 89.7 fL (ref 78.0–100.0)
Platelets: 171 10*3/uL (ref 150–400)
Platelets: 192 10*3/uL (ref 150–400)
RDW: 13.9 % (ref 11.5–15.5)
RDW: 14.8 % (ref 11.5–15.5)
RDW: 14.9 % (ref 11.5–15.5)
WBC: 3.8 10*3/uL — ABNORMAL LOW (ref 4.0–10.5)

## 2010-11-20 LAB — BASIC METABOLIC PANEL
BUN: 11 mg/dL (ref 6–23)
CO2: 28 mEq/L (ref 19–32)
Calcium: 9 mg/dL (ref 8.4–10.5)
Creatinine, Ser: 0.7 mg/dL (ref 0.4–1.2)
GFR calc non Af Amer: >60 mL/min (ref >60)
Glucose, Bld: 104 mg/dL — ABNORMAL HIGH (ref 70–99)
Sodium: 140 mEq/L (ref 135–145)

## 2010-11-20 LAB — CK: Total CK: 76 U/L (ref 7–177)

## 2010-11-20 MED ORDER — AMPHETAMINE-DEXTROAMPHETAMINE 10 MG PO TABS: ORAL_TABLET | ORAL | Status: DC

## 2010-11-20 NOTE — Telephone Encounter (Signed)
Pt called requesting refills of Adderall, last written 03/07

## 2010-11-20 NOTE — Telephone Encounter (Signed)
Pt informed, Rx in cabinet for pt pick up  

## 2010-12-23 ENCOUNTER — Other Ambulatory Visit: Payer: Self-pay

## 2010-12-23 MED ORDER — AMPHETAMINE-DEXTROAMPHETAMINE 10 MG PO TABS: ORAL_TABLET | ORAL | Status: DC

## 2010-12-23 NOTE — Telephone Encounter (Signed)
Pt informed via VM, Rx in cabinet for pt pick up  

## 2010-12-24 NOTE — Discharge Summary (Signed)
[name], [name]               ACCOUNT NO.:  [id number]   MEDICAL RECORD NO.:  [id number]          PATIENT TYPE:  INP   LOCATION:  5120                         FACILITY:  [facility]   PHYSICIAN:  Valerie A. Felicity Coyer, MDDATE OF BIRTH:  1956-07-26   DATE OF ADMISSION:  12/03/2008  DATE OF DISCHARGE:  12/06/2008                               DISCHARGE SUMMARY   DISCHARGE DIAGNOSES:  1. Status post copperhead snakebite envenomation of right second toe.  2. Hypertension history.   DISCHARGE MEDICATIONS:  1. Percocet 5/325 one p.o. q.4 h. p.r.n. pain, dispensed 40.  2. Aspirin 325 mg p.o. daily until followup with Dr. August Saucer (see ortho).   Other medications are as prior to admission and include:  1. Verapamil 120 mg p.o. b.i.d.  2. Maxzide 37.5/25 p.o. daily.  3. Estradiol 1 mg one and half tablet daily.  4. Prilosec 20 mg once daily.   DISPOSITION:  The patient is discharged home in medically stable and  improved condition.  She has no associated cellulitis and DVT rule out  Doppler pending at the time of dictation, anticipated negative prior to  the time of discharge.   HOSPITAL FOLLOWUP:  With Dr. August Saucer, Orthopedics at [facility] in 1  week.  The patient provided telephone number to call for appointment.   HOSPITAL COURSE:  1. Right foot snakebite:  The patient is a 55 year old woman with      history of hypertension, who came to the emergency room following a      bite to her right second toe at approximately 3:30 on the day of      admission.  Subsequent pain within the right foot and calf      commenced causing her to come to the emergency room for further      evaluation.  After recognizing the snakebite, the snake was killed      and identified as a copperhead.  After further discussion by the      emergency room with Poison Control, it was felt appropriate for      antivenom treatment and she was given 4 vials of CroFab within the      emergency room and referred for  admission.  She was seen in      consultation both by Orthopedics, Dr. August Saucer on call as well as      admitted to [facility] as she is followed by Dr. Drue Novel for      her primary care.  Poison Control frequently checked on the patient      according to protocol and nursing diligently performed cath      measurements as well as right mid thigh and right foot with      documentation to establish level of edema.  She tolerated the      CroFab without complications and in fact was recommended second 2      vials on the evening of December 04, 2008, due to increased swelling.      She was treated empirically with IV Unasyn, but no evidence of      cellulitis  could be observed and this was discontinued after 48      hours.  She was initially maintained on PCA narcotics for control      of her pain, but transitioned to oral Percocet as needed which she      has had none.  She is feeling improved.  She is ambulating around      the room and in the hall without significant complication for      expected pain and felt stable at this time for discharge home.  Dr.      August Saucer is requested to rule out of DVT prior to discharge and a      Doppler has been ordered pending at the time of this dictation.      She will remain on full-dose daily aspirin therapy 325 until      further followup with Dr. August Saucer next week.  The patient has been      instructed to call Dr. August Saucer for followup in 1 week and this plan      was reviewed by me with him via phone as well as with the patient      in the presence of her mother in the room.  2. Hypertension:  The patient's blood pressure medications were held      during this hospitalization while she was on IV narcotics and      treatment ongoing for antivenom therapy.  She is felt safe to      resume her home antihypertensive regimen at this time without      further complications and will call her primary care physician as      needed for routine followup on this and  other issues.  The patient      is felt stable for discharge home.   Over 30 minutes spent on the day of discharge in coordination for  discharge planning, discussion with other providers, and patient  education.  Please see hospital chart for other further details.      Valerie A. Felicity Coyer, MD  Electronically Signed     VAL/MEDQ  D:  12/06/2008  T:  12/07/2008  Job:  [id number]

## 2010-12-24 NOTE — H&P (Signed)
NAMEMARQUASIA, Duncan NO.:  [id number]   MEDICAL RECORD NO.:  [id number]          PATIENT TYPE:  INP   LOCATION:  5120                         FACILITY:  [facility]   PHYSICIAN:  Sabino Donovan, MD        DATE OF BIRTH:  September 03, 1955   DATE OF ADMISSION:  12/03/2008  DATE OF DISCHARGE:                              HISTORY & PHYSICAL   PCP:  Willow Ora, MD, in [place].   CHIEF COMPLAINT:  Right foot snakebite.   HISTORY OF PRESENT ILLNESS:  The patient is a 55 year old white female  with a history of hypertension who was walking to her car after putting  flowers  in the garden when she noted the snakebite.  She reports  swelling in her foot, extreme pain, erythema, some nausea but denies any  vomiting.  Denies any fever.  The snake was scattered and killed and  thought to be copperhead.   The patient presented to the ER 30 minutes after the incident and  Orthopedic surgery was consulted, recommended antivenom therapy as well  as IV fluids and pain control.  No signs of compartment syndrome noted.   PAST MEDICAL HISTORY:  1. Hypertension.  2. GERD.   PAST SURGICAL HISTORY:  Hysterectomy.   FAMILY HISTORY:  Noncontributory.   SOCIAL HISTORY:  Negative tobacco.  No IV drug abuse.  Occasional  alcohol.   DRUG ALLERGIES:  No known drug allergies.   MEDICATIONS:  1. Maxzide 25/375 mg p.o. daily.  2. Verapamil 120 mg p.o. b.i.d.  3. Prilosec 20 mg p.o. daily.  4. Adderall p.r.n.  5. Estradiol.   REVIEW OF SYMPTOMS:  Otherwise, unremarkable.   PHYSICAL EXAMINATION:  VITAL SIGNS:  Temperature 98.1, pulse 116,  respiratory rate 22, and blood pressure 141/97.  GENERAL:  She is in mild distress.  HEENT:  PERRLA.  EOMI.  NECK:  No lymphadenopathy, thyromegaly, or JVD.  CHEST:  Clear to auscultation bilaterally.  CARDIOVASCULAR:  Regular rate and rhythm.  No murmurs, rubs, or gallops.  ABDOMEN:  Soft, nontender, and nondistended.  Normoactive bowel sounds.  EXTREMITIES:  On her second right toe, there is evidence of a snakebite.  There is significant swelling of the dorsum of the right foot.  There is  some paraesthesia noted in the medial aspect of the thigh, positive  erythema and pain.   LABORATORY DATA:  Sodium 140, potassium 3.2, BUN 11, and creatinine 0.7.  White count 3.7, H and H 13.5 and 39.6, and platelets 192.  CK was 70.  INR 1.0, PT 12.8, and PTT 30.  Urinalysis was otherwise unremarkable.   IMPRESSION:  The patient is a 55 year old white female who presented  with right foot snakebite.  1. Right foot snakebite, thought to be secondary to copperhead      snakebite.  The patient presents with significant swelling and      pain.  Initial lab work report shows no coagulopathy.  The patient      has been evaluated by Ortho.  No signs of compartment syndrome.      She was given CroFab  protocol and receiving first dose now.  We      will repeat the dose if there is no change in symptoms in 1 hour      post transfusion, otherwise we will control the pain with the      morphine and PCA.  Continue IV fluids and continue to follow the      patient overnight.  2. Hypertension.  We will hold antihypertensive for now.  3. Prophylaxis, proton pump inhibitor.      Sabino Donovan, MD  Electronically Signed     MJ/MEDQ  D:  12/03/2008  T:  12/04/2008  Job:  [id number]

## 2010-12-24 NOTE — Consult Note (Signed)
NAME:  [name], [name] NO.:  [id number]   MEDICAL RECORD NO.:  [id number]          PATIENT TYPE:  INP   LOCATION:  5120                         FACILITY:  [facility]   PHYSICIAN:  Burnard Bunting, M.D.    DATE OF BIRTH:  November 09, 1955   DATE OF CONSULTATION:  DATE OF DISCHARGE:                                 CONSULTATION   REQUESTING PHYSICIAN:  Kennon Rounds, MD   CHIEF COMPLAINT:  Right foot snake bite.   HISTORY OF PRESENT ILLNESS:  [name] is a 55 year old female who  had a copperhead snake bite her right second toe today at 3:30.  She  reports right foot pain with some calf and leg pain on the right as  well.  She denies any shortness of breath.  She denies any fevers, but  she does report significant pain.   Her past medical and surgical history is notable for hypertension and  acid reflux.   CURRENT MEDICATIONS:  Estradiol and verapamil.   She is a nonsmoker, drinker, and does not do any IV drugs.  She lives in  [place], has an ex-husband.   She has no known drug allergies.   All 14-point system review is negative.  Particularly, no fever, chills,  chest pain, shortness of breath.   PHYSICAL EXAMINATION:  Blood pressure 132/87, respirations 18, heart  rate 82, and 99% O2 saturation.  She is in mild distress in pain from  the right foot.  She has palpable pedal pulse.  The snake bite has  present on the tip of the right second toe, that toe itself does have  swelling.  The dorsal aspect of the foot is swollen, but there was no  pain with passive flexion, extension of toes 1, 3, 4, and 5.  There was  not much in the way of plantar swelling.  There was mild calf tenderness  and mild thigh tenderness, but no red streaking.  She has intact  extensor mechanism.  Compartments in the leg and thigh are otherwise  soft.   Laboratory values include sodium and potassium 140 and 3.2, BUN and  creatinine 11 and 0.7.  White count is 3.7, hematocrit is 39,  platelets  192.  PT 12.8, PTT 30.  UA is negative.  CK 76.  Fibrinogen is 301.   IMPRESSION:  Snake bite right lower extremity with 4 vials of CroFab  given in the emergency room.   PLAN:  I think the patient needs a medical admission.  She does not have  evidence at this time of compartment syndrome.  I think the second toe  is potentially at risk for amputation should this swelling fail to  decrease.  She should continue with IV hydration, pain control with  morphine sulfate, PCA, and Zofran.  Unasyn x48 hours for  prophylaxis.  I discussed this with ID consultant.  The CroFab needs to  be repeated in 4-6 hours depending on clinical response with monitoring  of hemodynamics with administration of CroFab.  CBC, fibrinogen, CK need  to be rechecked in the morning.  She will likely be  in the hospital 2-3  days until resolution.      Burnard Bunting, M.D.  Electronically Signed     GSD/MEDQ  D:  12/03/2008  T:  12/04/2008  Job:  [id number]

## 2011-01-23 ENCOUNTER — Other Ambulatory Visit: Payer: Self-pay | Admitting: *Deleted

## 2011-01-23 MED ORDER — AMPHETAMINE-DEXTROAMPHETAMINE 10 MG PO TABS: ORAL_TABLET | ORAL | Status: DC

## 2011-01-23 NOTE — Telephone Encounter (Signed)
Pt informed. Rx placed upfront in cabinet ready for pickup.  

## 2011-01-23 NOTE — Telephone Encounter (Signed)
Pt is requesting refill of Adderall.

## 2011-01-27 ENCOUNTER — Ambulatory Visit: Payer: Self-pay | Admitting: Gastroenterology

## 2011-02-06 ENCOUNTER — Ambulatory Visit: Payer: Self-pay | Admitting: Gastroenterology

## 2011-02-18 ENCOUNTER — Encounter: Payer: Self-pay | Admitting: Internal Medicine

## 2011-02-25 ENCOUNTER — Other Ambulatory Visit: Payer: Self-pay | Admitting: Gastroenterology

## 2011-02-25 DIAGNOSIS — Z1211 Encounter for screening for malignant neoplasm of colon: Secondary | ICD-10-CM

## 2011-02-28 ENCOUNTER — Ambulatory Visit
Admission: RE | Admit: 2011-02-28 | Discharge: 2011-02-28 | Disposition: A | Discharge status: Home / Self Care | Payer: PRIVATE HEALTH INSURANCE | Source: Ambulatory Visit | Attending: Gastroenterology | Admitting: Gastroenterology

## 2011-02-28 ENCOUNTER — Telehealth: Payer: Self-pay | Admitting: *Deleted

## 2011-02-28 DIAGNOSIS — Z1211 Encounter for screening for malignant neoplasm of colon: Secondary | ICD-10-CM

## 2011-02-28 MED ORDER — AMPHETAMINE-DEXTROAMPHETAMINE 10 MG PO TABS: ORAL_TABLET | ORAL | Status: DC

## 2011-02-28 NOTE — Telephone Encounter (Signed)
Ok done

## 2011-02-28 NOTE — Telephone Encounter (Signed)
Inform P

## 2011-02-28 NOTE — Telephone Encounter (Signed)
Request for Adderall 10 mg.

## 2011-03-05 ENCOUNTER — Other Ambulatory Visit: Payer: Self-pay

## 2011-03-26 ENCOUNTER — Other Ambulatory Visit: Payer: Self-pay

## 2011-04-02 ENCOUNTER — Other Ambulatory Visit: Payer: Self-pay

## 2011-04-02 MED ORDER — AMPHETAMINE-DEXTROAMPHETAMINE 10 MG PO TABS: ORAL_TABLET | ORAL | Status: DC

## 2011-04-02 NOTE — Telephone Encounter (Signed)
Pt informed, Rx in cabinet for pt pick up  

## 2011-04-21 ENCOUNTER — Other Ambulatory Visit: Payer: Self-pay | Admitting: Internal Medicine

## 2011-04-21 ENCOUNTER — Telehealth: Payer: Self-pay | Admitting: *Deleted

## 2011-04-21 NOTE — Telephone Encounter (Signed)
Received fax stating Estradiol is on backorder. Pt was currently taking estradiol 1mg  1 1/2 tab daily.  Do md want to change to something else?..Marland KitchenMarland Kitchen9/10/12@8 :19am/LMB

## 2011-04-21 NOTE — Telephone Encounter (Signed)
Is brand name Estrace on backorder too? - there is really no other substitution but you are welcome to ask pharmacy for other suggestions - thanks

## 2011-04-21 NOTE — Telephone Encounter (Signed)
Please check with pt - we can do either premarin pills or estrogen vaginal cream until estradiol back in stock - thanks

## 2011-04-21 NOTE — Telephone Encounter (Signed)
Called pt she states she doesn't want vaginal cream would try premarin.Marland KitchenMarland Kitchen9/10/12@4 :11pm/LMB

## 2011-04-21 NOTE — Telephone Encounter (Signed)
Called pharmacy spoke with susan/pharmacist she states [name] name estrace is also on back order. Can change to premarin or if she want vaginal cream they are all available also.Marland KitchenMarland Kitchen9/10/12@1 :18pm/LMB

## 2011-04-21 NOTE — Telephone Encounter (Signed)
Ok - premarin done - may send or call to pharmacy to fill until estradiol back in stock - thx

## 2011-04-22 MED ORDER — ESTROGENS CONJUGATED 1.25 MG PO TABS: 1.2500 mg | ORAL_TABLET | Freq: Every day | ORAL | Status: DC

## 2011-04-22 NOTE — Telephone Encounter (Signed)
Sent premarin to [facility].Marland KitchenMarland Kitchen9/11/12@8 :26am/LMB

## 2011-05-05 ENCOUNTER — Other Ambulatory Visit: Payer: Self-pay

## 2011-05-05 MED ORDER — AMPHETAMINE-DEXTROAMPHETAMINE 10 MG PO TABS: ORAL_TABLET | ORAL | Status: DC

## 2011-05-06 NOTE — Telephone Encounter (Signed)
Pt informed, Rx in cabinet for pt pick up  

## 2011-05-17 ENCOUNTER — Other Ambulatory Visit: Payer: Self-pay | Admitting: Internal Medicine

## 2011-06-11 ENCOUNTER — Telehealth: Payer: Self-pay | Admitting: Student

## 2011-06-11 MED ORDER — AMPHETAMINE-DEXTROAMPHETAMINE 10 MG PO TABS: ORAL_TABLET | ORAL | Status: DC

## 2011-06-11 NOTE — Telephone Encounter (Signed)
ok 

## 2011-06-11 NOTE — Telephone Encounter (Signed)
Notified pt rx ready for pick-up...06/11/11@1 :52pm/LMB

## 2011-06-11 NOTE — Telephone Encounter (Signed)
Pt. Is requesting refill of Adderal

## 2011-06-21 ENCOUNTER — Other Ambulatory Visit: Payer: Self-pay | Admitting: Internal Medicine

## 2011-06-23 NOTE — Telephone Encounter (Signed)
ONE MONTH SUPPLY ONLY.

## 2011-07-14 ENCOUNTER — Telehealth: Payer: Self-pay | Admitting: *Deleted

## 2011-07-14 ENCOUNTER — Other Ambulatory Visit: Payer: Self-pay

## 2011-07-14 DIAGNOSIS — R918 Other nonspecific abnormal finding of lung field: Secondary | ICD-10-CM

## 2011-07-14 MED ORDER — AMPHETAMINE-DEXTROAMPHETAMINE 10 MG PO TABS: ORAL_TABLET | ORAL | Status: DC

## 2011-07-14 NOTE — Telephone Encounter (Signed)
Pt's spouse advised to inform pt that Rx is ready for pick up.

## 2011-07-14 NOTE — Telephone Encounter (Signed)
Pt walk-in stating she had a CT Virtual colonscopy done 02/28/11 was told to repeat in 6 months. Wanting md to set-up referral. Pt drop off report placing on md desk...07/14/11@5 :01pm/LMB

## 2011-07-14 NOTE — Telephone Encounter (Signed)
Pt walk-in stating that she had a CT Virtual colonoscopy done 02/28/11. Was told to repeat in 6 months. Would like the referral previous was order by her GYN. Pt drop off copy of results. Place on MD desk...07/14/11@4 :59pm/LMB

## 2011-07-15 NOTE — Telephone Encounter (Signed)
CT report reviewed. Report actually unremarkable regarding the virtual colonoscopy screening The report questions small lung nodule in left base which is felt to represent a branching vessel. Since patient is not a smoker, she is at low risk for lung cancer and therefore will wait until 12 months for repeat of chest CT as per recommendations. Followup CT chest for July 2013 has been put in the system. thanks

## 2011-07-15 NOTE — Telephone Encounter (Signed)
Notified pt with md response. Pt states there is a strong family hx of cancer. 2 family member just past from lung cancer less than 2 years both [place] aunt and uncle, also both grandparents has died from lung cancer. She states her insurance will cover and requesting to have done sooner....07/15/11@1 :42pm/LMB

## 2011-07-15 NOTE — Telephone Encounter (Signed)
Ordered for 09/01/11 (approx)

## 2011-07-15 NOTE — Telephone Encounter (Signed)
A user error has taken place. Msg taken on wrong chart. Correct chart # [id number]...07/15/1227:36am/LMB

## 2011-07-16 NOTE — Telephone Encounter (Signed)
Notified pt with md response...07/16/11@11 :34am/LMB

## 2011-07-22 ENCOUNTER — Other Ambulatory Visit: Payer: PRIVATE HEALTH INSURANCE

## 2011-07-22 ENCOUNTER — Other Ambulatory Visit: Payer: Self-pay | Admitting: Internal Medicine

## 2011-07-24 ENCOUNTER — Ambulatory Visit (INDEPENDENT_AMBULATORY_CARE_PROVIDER_SITE_OTHER)
Admission: RE | Admit: 2011-07-24 | Discharge: 2011-07-24 | Disposition: A | Discharge status: Home / Self Care | Payer: BC Managed Care – PPO | Source: Ambulatory Visit | Attending: Internal Medicine | Admitting: Internal Medicine

## 2011-07-24 DIAGNOSIS — R918 Other nonspecific abnormal finding of lung field: Secondary | ICD-10-CM

## 2011-07-24 MED ORDER — IOHEXOL 300 MG/ML  SOLN
80.0000 mL | Freq: Once | INTRAMUSCULAR | Status: AC | PRN
  Administered 2011-07-24: 80 mL via INTRAVENOUS

## 2011-08-11 ENCOUNTER — Other Ambulatory Visit: Payer: Self-pay | Admitting: Internal Medicine

## 2011-08-13 ENCOUNTER — Other Ambulatory Visit: Payer: Self-pay

## 2011-08-13 MED ORDER — AMPHETAMINE-DEXTROAMPHETAMINE 10 MG PO TABS: ORAL_TABLET | ORAL | Status: DC

## 2011-08-13 NOTE — Telephone Encounter (Signed)
Pt called requesting refill of Adderall, please advise. Pt advised that last OV 05/2010 and she is due for appt.

## 2011-08-14 NOTE — Telephone Encounter (Signed)
Pt informed, Rx in cabinet for pt pick up  

## 2011-09-29 ENCOUNTER — Other Ambulatory Visit: Payer: Self-pay | Admitting: Internal Medicine

## 2011-11-06 ENCOUNTER — Other Ambulatory Visit: Payer: Self-pay | Admitting: Internal Medicine

## 2011-12-15 ENCOUNTER — Other Ambulatory Visit: Payer: Self-pay | Admitting: Internal Medicine

## 2011-12-22 ENCOUNTER — Other Ambulatory Visit: Payer: Self-pay | Admitting: Internal Medicine

## 2011-12-22 ENCOUNTER — Telehealth: Payer: Self-pay | Admitting: *Deleted

## 2011-12-22 DIAGNOSIS — Z0000 Encounter for general adult medical examination without abnormal findings: Secondary | ICD-10-CM

## 2011-12-22 NOTE — Telephone Encounter (Signed)
Message copied by Deatra James on Mon Dec 22, 2011 12:47 PM ------      Message from: COUSIN, SHARON T      Created: Mon Dec 22, 2011 11:59 AM      Regarding: PHY DATE  01/08/12       THANKS

## 2011-12-22 NOTE — Telephone Encounter (Signed)
Received staff msg pt made cpx for 01/08/12. Need labs entered in epic.... 12/22/11@12 :48pm/LMB

## 2012-01-06 ENCOUNTER — Other Ambulatory Visit (INDEPENDENT_AMBULATORY_CARE_PROVIDER_SITE_OTHER): Payer: BC Managed Care – PPO

## 2012-01-06 DIAGNOSIS — Z0000 Encounter for general adult medical examination without abnormal findings: Secondary | ICD-10-CM

## 2012-01-06 LAB — CBC WITH DIFFERENTIAL/PLATELET
Basophils Absolute: 0 10*3/uL (ref 0.0–0.1)
Eosinophils Relative: 3.1 % (ref 0.0–5.0)
HCT: 39 % (ref 36.0–46.0)
Hemoglobin: 12.6 g/dL (ref 12.0–15.0)
Lymphs Abs: 1.4 10*3/uL (ref 0.7–4.0)
MCV: 87.7 fl (ref 78.0–100.0)
Monocytes Absolute: 0.5 10*3/uL (ref 0.1–1.0)
Neutro Abs: 2.4 10*3/uL (ref 1.4–7.7)
Platelets: 276 10*3/uL (ref 150.0–400.0)
RDW: 15.5 % — ABNORMAL HIGH (ref 11.5–14.6)

## 2012-01-06 LAB — URINALYSIS, ROUTINE W REFLEX MICROSCOPIC
Bilirubin Urine: NEGATIVE
Ketones, ur: NEGATIVE
Leukocytes, UA: NEGATIVE
Nitrite: NEGATIVE
Urobilinogen, UA: 0.2 (ref 0.0–1.0)
pH: 6.5 (ref 5.0–8.0)

## 2012-01-06 LAB — BASIC METABOLIC PANEL
BUN: 11 mg/dL (ref 6–23)
CO2: 29 mEq/L (ref 19–32)
Chloride: 103 mEq/L (ref 96–112)
Glucose, Bld: 89 mg/dL (ref 70–99)
Potassium: 3.8 mEq/L (ref 3.5–5.1)

## 2012-01-06 LAB — HEPATIC FUNCTION PANEL
AST: 33 U/L (ref 0–37)
Albumin: 3.7 g/dL (ref 3.5–5.2)
Total Protein: 6.8 g/dL (ref 6.0–8.3)

## 2012-01-06 LAB — LIPID PANEL: Cholesterol: 168 mg/dL (ref 0–200)

## 2012-01-06 LAB — TSH: TSH: 1.84 u[IU]/mL (ref 0.35–5.50)

## 2012-01-08 ENCOUNTER — Ambulatory Visit (INDEPENDENT_AMBULATORY_CARE_PROVIDER_SITE_OTHER): Payer: BC Managed Care – PPO | Admitting: Internal Medicine

## 2012-01-08 ENCOUNTER — Other Ambulatory Visit: Payer: Self-pay | Admitting: Internal Medicine

## 2012-01-08 ENCOUNTER — Encounter: Payer: Self-pay | Admitting: Internal Medicine

## 2012-01-08 VITALS — BP 112/80 | HR 73 | Temp 97.5°F | Ht 66.5 in | Wt 162.8 lb

## 2012-01-08 DIAGNOSIS — I10 Essential (primary) hypertension: Secondary | ICD-10-CM

## 2012-01-08 DIAGNOSIS — R911 Solitary pulmonary nodule: Secondary | ICD-10-CM

## 2012-01-08 DIAGNOSIS — L259 Unspecified contact dermatitis, unspecified cause: Secondary | ICD-10-CM

## 2012-01-08 DIAGNOSIS — Z0000 Encounter for general adult medical examination without abnormal findings: Secondary | ICD-10-CM

## 2012-01-08 DIAGNOSIS — L309 Dermatitis, unspecified: Secondary | ICD-10-CM

## 2012-01-08 MED ORDER — TRIAMCINOLONE ACETONIDE 0.1 % EX CREA: TOPICAL_CREAM | Freq: Two times a day (BID) | CUTANEOUS | Status: DC | PRN

## 2012-01-08 MED ORDER — ESTROGENS CONJUGATED 1.25 MG PO TABS: 1.2500 mg | ORAL_TABLET | Freq: Every day | ORAL | Status: DC

## 2012-01-08 MED ORDER — VERAPAMIL HCL 120 MG PO TABS: 120.0000 mg | ORAL_TABLET | Freq: Three times a day (TID) | ORAL | Status: DC

## 2012-01-08 MED ORDER — TRIAMTERENE-HCTZ 37.5-25 MG PO TABS: 1.0000 | ORAL_TABLET | Freq: Every day | ORAL | Status: DC

## 2012-01-08 NOTE — Patient Instructions (Signed)
It was good to see you today. We have reviewed your interval medical history including labs and tests today we'll make referral for follow up CT chest. Our office will contact you regarding appointment(s) once made.  Health Maintenance reviewed - please schedule your PAP - all recommended immunizations and age-appropriate screenings are up-to-date. Medications reviewed, no changes at this time. Use new steroid cream as needed - Refill on medication(s) as discussed today. Your prescription(s) have been submitted to your pharmacy. Please take as directed and contact our office if you believe you are having problem(s) with the medication(s). Please schedule followup in 12 months for physical and labs, call sooner if problems.

## 2012-01-08 NOTE — Assessment & Plan Note (Signed)
follow up CT 01/14/12

## 2012-01-08 NOTE — Assessment & Plan Note (Signed)
Stable on current medications 

## 2012-01-08 NOTE — Progress Notes (Signed)
Subjective:    Patient ID: [name], female    DOB: 01/04/1956, 56 y.o.   MRN: [id number]  HPI patient is here today for annual physical. Patient feels well and has no complaints.  Past Medical History  Diagnosis Date  . ADD   . ALLERGIC RHINITIS   . GERD   . HOMOCYSTINEMIA   . HYPERTENSION   . IBS    Family History  Problem Relation Age of Onset  . Coronary artery disease Mother   . Hypertension Mother   . Coronary artery disease Father   . Hypertension Father   . Diabetes Father   . Stroke Father   . Coronary artery disease Maternal Aunt   . Stroke Maternal Aunt   . Stroke Maternal Uncle   . Stroke Paternal Uncle   . Hypertension Maternal Grandmother   . Hypertension Maternal Grandfather   . Colon cancer Maternal Grandfather   . Hypertension Paternal Grandmother   . Hypertension Paternal Grandfather   . Colon cancer Paternal Grandfather   . Colon cancer Cousin    History  Substance Use Topics  . Smoking status: Never Smoker   . Smokeless tobacco: Not on file   Comment: Single-occassional boyfriend. 3 children-yougest son with cystic fibrosis. currently unemployed since 01/2010. 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 Alcohol Use: Yes    Review of Systems Constitutional: Negative for fever or weight change.  Respiratory: Negative for cough and shortness of breath.   Cardiovascular: Negative for chest pain or palpitations.  Gastrointestinal: Negative for abdominal pain, no bowel changes.  Musculoskeletal: Negative for gait problem or joint swelling.  Skin: Negative for rash.  Neurological: Negative for dizziness or headache.  No other specific complaints in a complete review of systems (except as listed in HPI above).     Objective:   Physical Exam BP 112/80  Pulse 73  Temp(Src) 97.5 F (36.4 C) (Oral)  Ht 5' 6.5" (1.689 m)  Wt 162 lb 12.8 oz (73.846 kg)  BMI 25.88 kg/m2  SpO2 97% Wt Readings from Last 3 Encounters:  01/08/12 162 lb  12.8 oz (73.846 kg)  05/22/10 152 lb 1.9 oz (69.001 kg)  12/28/09 151 lb (68.493 kg)   Constitutional: She appears well-developed and well-nourished. No distress.  HENT: Head: Normocephalic and atraumatic. Ears: B TMs ok, no erythema or effusion; Nose: Nose normal.  Mouth/Throat: Oropharynx is clear and moist. No oropharyngeal exudate.  Eyes: Conjunctivae and EOM are normal. Pupils are equal, round, and reactive to light. No scleral icterus.  Neck: Normal range of motion. Neck supple. No JVD present. No thyromegaly present.  Cardiovascular: Normal rate, regular rhythm and normal heart sounds.  No murmur heard. No BLE edema. Pulmonary/Chest: Effort normal and breath sounds normal. No respiratory distress. She has no wheezes.  Abdominal: Soft. Bowel sounds are normal. She exhibits no distension. There is no tenderness. no masses Musculoskeletal: Normal range of motion, no joint effusions. No gross deformities Neurological: She is alert and oriented to person, place, and time. No cranial nerve deficit. Coordination normal.  Skin: Skin is warm and dry. No rash noted. No erythema.  Psychiatric: She has a normal mood and affect. Her behavior is normal. Judgment and thought content normal.   Lab Results  Component Value Date   WBC 4.5 01/06/2012   HGB 12.6 01/06/2012   HCT 39.0 01/06/2012   PLT 276.0 01/06/2012   GLUCOSE 89 01/06/2012   CHOL 168 01/06/2012   TRIG 52.0 01/06/2012  HDL 72.40 01/06/2012   LDLDIRECT 111.7 12/03/2007   LDLCALC 85 01/06/2012   ALT 30 01/06/2012   AST 33 01/06/2012   NA 140 01/06/2012   K 3.8 01/06/2012   CL 103 01/06/2012   CREATININE 0.6 01/06/2012   BUN 11 01/06/2012   CO2 29 01/06/2012   TSH 1.84 01/06/2012   INR 1.0 12/06/2008   ECG: NSR @ 71 bpm     Assessment & Plan:  CPX/v70.0 - Patient has been counseled on age-appropriate routine health concerns for screening and prevention. These are reviewed and up-to-date. Immunizations are up-to-date or declined. Labs and  ECG reviewed.

## 2012-01-08 NOTE — Assessment & Plan Note (Signed)
Kenalog prn - erx done

## 2012-01-08 NOTE — Assessment & Plan Note (Signed)
BP Readings from Last 3 Encounters:  01/08/12 112/80  05/22/10 124/82  12/28/09 120/84   The current medical regimen is effective;  continue present plan and medications.

## 2012-01-12 ENCOUNTER — Ambulatory Visit (INDEPENDENT_AMBULATORY_CARE_PROVIDER_SITE_OTHER)
Admission: RE | Admit: 2012-01-12 | Discharge: 2012-01-12 | Disposition: A | Discharge status: Home / Self Care | Payer: BC Managed Care – PPO | Source: Ambulatory Visit | Attending: Internal Medicine | Admitting: Internal Medicine

## 2012-01-12 DIAGNOSIS — R911 Solitary pulmonary nodule: Secondary | ICD-10-CM

## 2012-01-12 MED ORDER — IOHEXOL 300 MG/ML  SOLN
80.0000 mL | Freq: Once | INTRAMUSCULAR | Status: AC | PRN
  Administered 2012-01-12: 80 mL via INTRAVENOUS

## 2012-01-19 ENCOUNTER — Other Ambulatory Visit: Payer: Self-pay | Admitting: *Deleted

## 2012-01-19 MED ORDER — VERAPAMIL HCL 120 MG PO TABS: 120.0000 mg | ORAL_TABLET | Freq: Three times a day (TID) | ORAL | Status: DC

## 2012-01-19 MED ORDER — TRIAMTERENE-HCTZ 37.5-25 MG PO TABS: 1.0000 | ORAL_TABLET | Freq: Every day | ORAL | Status: DC

## 2012-01-26 ENCOUNTER — Other Ambulatory Visit: Payer: Self-pay | Admitting: Internal Medicine

## 2012-02-18 ENCOUNTER — Other Ambulatory Visit: Payer: Self-pay | Admitting: Internal Medicine

## 2012-02-18 MED ORDER — AMPHETAMINE-DEXTROAMPHETAMINE 10 MG PO TABS: 10.0000 mg | ORAL_TABLET | Freq: Two times a day (BID) | ORAL | Status: DC

## 2012-02-18 NOTE — Telephone Encounter (Signed)
Called from [phone]  to request new RX for Adderall 10 mg (short acting).  Recently seen in office; states MD said to call if would like to resume RX.  Started new job; feels it is needed.   Would prefer 90 day RX or 3 RX for 30 days, if possible.   Requests call when RX ready for pick up.

## 2012-02-18 NOTE — Telephone Encounter (Signed)
ok 

## 2012-02-24 ENCOUNTER — Encounter: Payer: Self-pay | Admitting: Internal Medicine

## 2012-03-11 LAB — HM PAP SMEAR

## 2012-04-02 ENCOUNTER — Other Ambulatory Visit: Payer: Self-pay

## 2012-04-02 MED ORDER — AMPHETAMINE-DEXTROAMPHETAMINE 10 MG PO TABS: 10.0000 mg | ORAL_TABLET | Freq: Two times a day (BID) | ORAL | Status: DC

## 2012-04-02 NOTE — Telephone Encounter (Signed)
Patient informed to pickup prescription requested at the front desk.

## 2012-04-02 NOTE — Telephone Encounter (Signed)
Done hardcopy to robin  

## 2012-04-24 ENCOUNTER — Encounter (HOSPITAL_COMMUNITY): Payer: Self-pay | Admitting: Family Medicine

## 2012-04-24 ENCOUNTER — Emergency Department (HOSPITAL_COMMUNITY)
Admission: EM | Admit: 2012-04-24 | Discharge: 2012-04-24 | Disposition: A | Discharge status: Home / Self Care | Payer: BC Managed Care – PPO | Attending: Emergency Medicine | Admitting: Emergency Medicine

## 2012-04-24 ENCOUNTER — Emergency Department (HOSPITAL_COMMUNITY): Payer: BC Managed Care – PPO

## 2012-04-24 DIAGNOSIS — K589 Irritable bowel syndrome without diarrhea: Secondary | ICD-10-CM | POA: Insufficient documentation

## 2012-04-24 DIAGNOSIS — K219 Gastro-esophageal reflux disease without esophagitis: Secondary | ICD-10-CM | POA: Insufficient documentation

## 2012-04-24 DIAGNOSIS — I10 Essential (primary) hypertension: Secondary | ICD-10-CM | POA: Insufficient documentation

## 2012-04-24 DIAGNOSIS — K8050 Calculus of bile duct without cholangitis or cholecystitis without obstruction: Secondary | ICD-10-CM

## 2012-04-24 DIAGNOSIS — F988 Other specified behavioral and emotional disorders with onset usually occurring in childhood and adolescence: Secondary | ICD-10-CM | POA: Insufficient documentation

## 2012-04-24 DIAGNOSIS — K8020 Calculus of gallbladder without cholecystitis without obstruction: Secondary | ICD-10-CM | POA: Insufficient documentation

## 2012-04-24 LAB — CBC
HCT: 37.8 % (ref 36.0–46.0)
Hemoglobin: 12.9 g/dL (ref 12.0–15.0)
MCH: 28.9 pg (ref 26.0–34.0)
MCHC: 34.1 g/dL (ref 30.0–36.0)
MCV: 84.6 fL (ref 78.0–100.0)
RDW: 14.7 % (ref 11.5–15.5)

## 2012-04-24 LAB — COMPREHENSIVE METABOLIC PANEL
Albumin: 4.2 g/dL (ref 3.5–5.2)
Alkaline Phosphatase: 78 U/L (ref 39–117)
BUN: 11 mg/dL (ref 6–23)
Calcium: 9.9 mg/dL (ref 8.4–10.5)
Creatinine, Ser: 0.64 mg/dL (ref 0.50–1.10)
GFR calc Af Amer: >90 mL/min (ref >90)
Glucose, Bld: 97 mg/dL (ref 70–99)
Potassium: 3.2 mEq/L — ABNORMAL LOW (ref 3.5–5.1)
Total Protein: 7.5 g/dL (ref 6.0–8.3)

## 2012-04-24 LAB — URINALYSIS, ROUTINE W REFLEX MICROSCOPIC
Bilirubin Urine: NEGATIVE
Glucose, UA: NEGATIVE mg/dL
Ketones, ur: NEGATIVE mg/dL
Nitrite: NEGATIVE
Protein, ur: NEGATIVE mg/dL
pH: 6.5 (ref 5.0–8.0)

## 2012-04-24 LAB — LIPASE, BLOOD: Lipase: 35 U/L (ref 11–59)

## 2012-04-24 MED ORDER — ONDANSETRON 8 MG PO TBDP: ORAL_TABLET | ORAL | Status: DC

## 2012-04-24 MED ORDER — POTASSIUM CHLORIDE CRYS ER 20 MEQ PO TBCR
40.0000 meq | EXTENDED_RELEASE_TABLET | Freq: Once | ORAL | Status: AC
  Administered 2012-04-24: 40 meq via ORAL
  Filled 2012-04-24: qty 2

## 2012-04-24 MED ORDER — HYDROCODONE-ACETAMINOPHEN 5-325 MG PO TABS: 1.0000 | ORAL_TABLET | ORAL | Status: AC | PRN

## 2012-04-24 NOTE — ED Provider Notes (Signed)
History  Scribed for Julia Chick, MD, the patient was seen in room TR06C/TR06C. This chart was scribed by Candelaria Stagers. The patient's care started at 5:34 PM   CSN: [id number]  Arrival date & time 04/24/12  1549   First MD Initiated Contact with Patient 04/24/12 1711      Chief Complaint  Patient presents with  . Abdominal Pain    RLQ     The history is provided by the patient. No language interpreter was used.   CHARLESTON Duncan is a 56 y.o. female who presents to the Emergency Department complaining of sudden onset of right sided sharp abdominal pain that started earlier today.  She states that the pain radiates to her neck.  She states that now the pain has eased off.  She is also experiencing nausea and states that the pain has caused SOB.  She has never experienced these sx before.  She denies fever, cough, swelling of legs, or recent traveling.    No fever/chills.  She has not had any treatment prior to arrival.  There are no other associated systemic symptoms, there are no other alleviating or modifying factors.   Past Medical History  Diagnosis Date  . ADD   . ALLERGIC RHINITIS   . GERD   . HOMOCYSTINEMIA   . HYPERTENSION   . IBS   . Incidental lung nodule 02/2011 ct    unchanged 07/16/11 CT, 6 mo f/u  planned    Past Surgical History  Procedure Date  . Abdominal hysterectomy   . Bladder tack 2001    Family History  Problem Relation Age of Onset  . Coronary artery disease Mother   . Hypertension Mother   . Coronary artery disease Father   . Hypertension Father   . Diabetes Father   . Stroke Father   . Coronary artery disease Maternal Aunt   . Stroke Maternal Aunt   . Stroke Maternal Uncle   . Stroke Paternal Uncle   . Hypertension Maternal Grandmother   . Hypertension Maternal Grandfather   . Colon cancer Maternal Grandfather   . Hypertension Paternal Grandmother   . Hypertension Paternal Grandfather   . Colon cancer Paternal Grandfather   . Colon  cancer Cousin     History  Substance Use Topics  . Smoking status: Never Smoker   . Smokeless tobacco: Not on file   Comment: Single-occassional boyfriend. 3 children-yougest son with cystic fibrosis. currently unemployed since 01/2010. 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 Alcohol Use: Yes    OB History    Grav Para Term Preterm Abortions TAB SAB Ect Mult Living                  Review of Systems  Respiratory: Positive for shortness of breath (associated with abdominal pain).   Gastrointestinal: Positive for nausea and abdominal pain. Negative for vomiting.  All other systems reviewed and are negative.    Allergies  Nifedipine  Home Medications   Current Outpatient Rx  Name Route Sig Dispense Refill  . AMPHETAMINE-DEXTROAMPHETAMINE 10 MG PO TABS Oral Take 10-20 mg by mouth 2 (two) times daily.    Marland Kitchen ESTRADIOL 1 MG PO TABS Oral Take 1.5 mg by mouth daily.    Marland Kitchen OMEPRAZOLE MAGNESIUM 20 MG PO TBEC Oral Take 20 mg by mouth daily as needed.      . TRIAMCINOLONE ACETONIDE 0.1 % EX CREA Topical Apply topically 2 (two) times daily as needed.    Marland Kitchen  TRIAMTERENE-HCTZ 37.5-25 MG PO TABS Oral Take 1 each (1 tablet total) by mouth daily. 90 tablet 3  . VERAPAMIL HCL 120 MG PO TABS Oral Take 120 mg by mouth 2 (two) times daily.    Marland Kitchen HYDROCODONE-ACETAMINOPHEN 5-325 MG PO TABS Oral Take 1 tablet by mouth every 4 (four) hours as needed for pain. 10 tablet 0  . ONDANSETRON 8 MG PO TBDP  8mg  ODT q4 hours prn nausea 20 tablet 0    BP 135/95  Pulse 80  Temp 98 F (36.7 C) (Oral)  Resp 16  SpO2 95%  Physical Exam  Nursing note and vitals reviewed. Constitutional: She is oriented to person, place, and time. She appears well-developed and well-nourished. No distress.  HENT:  Head: Normocephalic and atraumatic.  Eyes: EOM are normal. Pupils are equal, round, and reactive to light.  Neck: Neck supple. No tracheal deviation present.  Pulmonary/Chest: Effort normal. No respiratory  distress.  Abdominal: There is tenderness.       RUQ tenderness.   Musculoskeletal: Normal range of motion. She exhibits no edema.  Neurological: She is alert and oriented to person, place, and time. No sensory deficit.  Skin: Skin is warm and dry.  Psychiatric: She has a normal mood and affect. Her behavior is normal.  note- abdomen with no gaurding, no rebound, NABS  ED Course  Procedures   DIAGNOSTIC STUDIES: Oxygen Saturation is 95% on room air, normal by my interpretation.    COORDINATION OF CARE:  15:52 Ordered: DG Chest 2 View 17:40 Ordered: US Abdomen Complete; CBC; Comprehensive metabolic panel; Lipase, blood; Urinalysis, Routine w reflex microscopic  Labs Reviewed  COMPREHENSIVE METABOLIC PANEL - Abnormal; Notable for the following:    Potassium 3.2 (*)     All other components within normal limits  CBC  LIPASE, BLOOD  URINALYSIS, ROUTINE W REFLEX MICROSCOPIC  LAB REPORT - SCANNED   Dg Chest 2 View  04/24/2012  *RADIOLOGY REPORT*  Clinical Data: Right-sided pain and shortness of breath.  CHEST - 2 VIEW  Comparison: 03/13/2012 and prior chest CTs  Findings: The cardiomediastinal silhouette is unremarkable. The lungs are clear. There is no evidence of focal airspace disease, pulmonary edema, suspicious pulmonary nodule/mass, pleural effusion, or pneumothorax. No acute bony abnormalities are identified.  IMPRESSION: No evidence of active cardiopulmonary disease.   Original Report Authenticated By: Rosendo Gros, M.D.    US Abdomen Complete  04/24/2012  *RADIOLOGY REPORT*  Clinical Data:  56 year old female with right-sided abdominal pain and tenderness.  ABDOMINAL ULTRASOUND COMPLETE  Comparison:  02/28/2011 CT  Findings:  Gallbladder: A 3 mm gallbladder polyp is noted. There is no evidence of gallstones, gallbladder wall thickening, or pericholecystic fluid.  Common Bile Duct:  There is no evidence of intrahepatic or extrahepatic biliary dilation. The CBD measures 2.7 mm in  greatest diameter.  Liver:  The liver is within normal limits in parenchymal echogenicity. No focal abnormalities are identified except for 1 cm left hepatic cyst.  IVC:  Appears normal.  Pancreas:  Although the pancreas is difficult to visualize in its entirety, no focal pancreatic abnormality is identified.  Spleen:  Within normal limits in size and echotexture.  Right kidney:  The right kidney is normal in size and parenchymal echogenicity.  There is no evidence of solid mass, hydronephrosis or definite renal calculi.  The right kidney measures 11.5 cm.  Left kidney:  The left kidney is normal in size and parenchymal echogenicity.  There is no evidence of solid mass,  hydronephrosis or definite renal calculi.   The left kidney measures 11.0 cm.  Abdominal Aorta:  No abdominal aortic aneurysm identified.  There is no evidence of ascites.  IMPRESSION: 3 mm gallbladder polyp, otherwise unremarkable abdominal ultrasound.   Original Report Authenticated By: Rosendo Gros, M.D.      1. Biliary colic       MDM  Pt presenting with c/o abdominal pain in right upper abdomen, which began this morning.  She has mild right upper quadrant tenderness.  She is declining pain medication in the ED.  Labs are reassuring.  Normal urinalysis.  CXR obtained due to acute nature of pain and possibility of PTX, however pain is more abdominal in origin.  Abdominal ultrasound ordered.  Signed out to Ivonne Andrew, PA to f/u on ultrasound results and discharge patient.    I personally performed the services described in this documentation, which was scribed in my presence. The recorded information has been reviewed and considered.       Julia Chick, MD 04/25/12 1158

## 2012-04-24 NOTE — ED Notes (Signed)
Pt sts sudden onset of right rib pain sharp stabbing. Denies injury . sts hurts to move and when she breathes in.

## 2012-04-24 NOTE — ED Notes (Cosign Needed)
[name] S  8:00 PM patient discussed in sign out with Dr. Karma Ganja. Patient with acute onset upper abdominal discomforts. Labs unremarkable. Ultrasound pending to rule out biliary cause.   Ultrasound results show a small gallbladder polyp otherwise unremarkable. No signs for acute cholecystitis. At this time patient appears much more comfortable is laughing and talking with family. At this time patient will followup with PCP or GI specialist.  Angus Seller, PA 04/24/12 2102

## 2012-04-26 ENCOUNTER — Telehealth: Payer: Self-pay | Admitting: Internal Medicine

## 2012-04-27 NOTE — Telephone Encounter (Signed)
Pt had an episode last week with abdominal pain on her right side that went around to her back. Pt was seen in the ER and told to follow-up with her PCP or GI doc. Pt was told she has a polyp on her gallbladder. Offered pt an appt with [facility] this week but pt wants to schedule with Dr. Marina Goodell. Pt scheduled to see Dr. Marina Goodell 05/26/12 @8 :45am. Pt to call back if she has problems or needs to be seen sooner.

## 2012-04-27 NOTE — Telephone Encounter (Signed)
Left message for pt to call back  °

## 2012-05-16 ENCOUNTER — Other Ambulatory Visit: Payer: Self-pay | Admitting: Internal Medicine

## 2012-05-18 ENCOUNTER — Other Ambulatory Visit: Payer: Self-pay | Admitting: Internal Medicine

## 2012-05-18 MED ORDER — AMPHETAMINE-DEXTROAMPHETAMINE 10 MG PO TABS: 10.0000 mg | ORAL_TABLET | Freq: Two times a day (BID) | ORAL | Status: DC

## 2012-05-18 NOTE — Telephone Encounter (Signed)
Caller: Franca/Patient; Patient Name: [name]; PCP: Rene Paci (Adults only); Best Callback Phone Number: [phone]; Call regarding: Medication refill; says she usually calls Dr.Leschber's nurse and leaves a message when she needs a rx for her Adderall 10mg  short acting medication and then she would get a call back when it was ready for pickup; says phone system prompts her to call pharmacy for all meds, including controlled meds; says she needs a rx for the Adderall; please call back when it's ready for pickup

## 2012-05-18 NOTE — Telephone Encounter (Signed)
Pt informed rx ready for pick up

## 2012-05-18 NOTE — Telephone Encounter (Signed)
ok 

## 2012-05-26 ENCOUNTER — Ambulatory Visit (INDEPENDENT_AMBULATORY_CARE_PROVIDER_SITE_OTHER): Payer: BC Managed Care – PPO | Admitting: Internal Medicine

## 2012-05-26 ENCOUNTER — Encounter: Payer: Self-pay | Admitting: Internal Medicine

## 2012-05-26 VITALS — BP 106/82 | HR 80 | Ht 66.0 in | Wt 156.5 lb

## 2012-05-26 DIAGNOSIS — R1011 Right upper quadrant pain: Secondary | ICD-10-CM

## 2012-05-26 NOTE — Progress Notes (Signed)
HISTORY OF PRESENT ILLNESS:  [name] is a 56 y.o. female with the below listed medical history who has been seen in this office remotely for GERD. Upper endoscopy in April of 2001 was normal. She also underwent colonoscopy at that time with intubation of the ileum. That was normal as well. She has apparently received more recent GI care in [facility]. She underwent followup screening colonoscopy in 2012. She tells me that the examination was incomplete. Thus, she was sent for a virtual colonoscopy 02/28/2011 (reviewed). There was no significant abnormality of the abdomen or pelvis.The colon revealed mild diverticulosis. She did have an incidental nodule the left lung base for which followup imaging is being provided by her primary provider. Her current complaint today is that of new onset right upper quadrant abdominal pain that brought her to the emergency room 04/24/2012 (reviewed the laboratories and x-rays as summarized). She described the pain as severe with discomfort under the right rib cage as well as the right shoulder area. There was associated nausea. No vomiting or other symptoms. The discomfort occurred about one hour after eating pigmented cheese. The discomfort was somewhat worse with certain movements. Breathing did not make the pain worse but she felt that it caused shortness of breath. Problem resolved over about 5 hours. Workup included normal CBC and comprehensive metabolic panel (potassium 3.2), negative chest x-ray, and abdominal ultrasound with 3 mm gallbladder polyp only. She tells me that she was not examined, but the emergency room note states right upper quadrant tenderness on palpation. However, she was felt to have biliary colic. This appointment made. Since that time, she has had no further recurrence of the severe pain. She does notice a right upper quadrant discomfort with sitting for prolonged period such as driving a car. She denies any change in bowel  habits, bleeding, fevers, or weight loss. No urinary complaints. I did not see a urinalysis. Over the past 2 years she has had mild weight gain.  REVIEW OF SYSTEMS:  All non-GI ROS negative upon comprehensive review, except mild irritation of the skin under the left eye  Past Medical History  Diagnosis Date  . ADD   . ALLERGIC RHINITIS   . GERD   . HOMOCYSTINEMIA   . HYPERTENSION   . IBS   . Incidental lung nodule 02/2011 ct    unchanged 07/16/11 CT, 6 mo f/u  planned    Past Surgical History  Procedure Date  . Abdominal hysterectomy   . Bladder tack 2001    Social History MACKENZE GRANDISON  reports that she has quit smoking. Her smoking use included Cigarettes. She started smoking about 35 years ago. She has never used smokeless tobacco. She reports that she drinks alcohol. She reports that she does not use illicit drugs.  family history includes Colon cancer in her cousin, maternal grandfather, and paternal grandfather; Coronary artery disease in her father, maternal aunt, and mother; Diabetes in her father; Hypertension in her father, maternal grandfather, maternal grandmother, mother, paternal grandfather, and paternal grandmother; and Stroke in her father, maternal aunt, maternal uncle, and paternal uncle.  Allergies  Allergen Reactions  . Nifedipine     REACTION: increased HR       PHYSICAL EXAMINATION: Vital signs: BP 106/82  Pulse 80  Ht 5\' 6"  (1.676 m)  Wt 156 lb 8 oz (70.988 kg)  BMI 25.26 kg/m2  Constitutional: generally well-appearing, no acute distress Psychiatric: alert and oriented x3, cooperative Eyes: extraocular movements intact, anicteric,  conjunctiva pink Mouth: oral pharynx moist, no lesions Neck: supple no lymphadenopathy Cardiovascular: heart regular rate and rhythm, no murmur Lungs: clear to auscultation bilaterally Abdomen: soft, nontender, nondistended, no obvious ascites, no peritoneal signs, normal bowel sounds, no organomegaly Extremities:  no lower extremity edema bilaterally Skin: no lesions on visible extremities Neuro: No focal deficits.   ASSESSMENT:  #1. Problems with acute right upper quadrant pain lasting 5 hours, without recurrence, a little over 4 weeks ago. Negative workup as outlined. Doubt gallbladder. Wonder about acute musculoskeletal pain. #2. History of GERD. Asymptomatic on PPI #3. Incomplete colonoscopy with subsequent virtual colonoscopy in [place], 2012, revealing mild diverticulosis only. Normal colonoscopy in 2001.   PLAN:  #1. At this point, given her extensive workup as well as evaluations last year, and her lack of significant symptoms since the episode, or would recommend observation. I do not see any indication, at this point, for upper endoscopy or HIDA scan. I would, however, like her to followup with me in 6-8 weeks, just to be reassured that things are well. If for interval questions or problems. She has agreed to contact the office.

## 2012-05-26 NOTE — Patient Instructions (Addendum)
Please follow up with Dr. Marina Goodell 6-8 weeks

## 2012-06-22 ENCOUNTER — Other Ambulatory Visit: Payer: Self-pay | Admitting: *Deleted

## 2012-06-22 MED ORDER — AMPHETAMINE-DEXTROAMPHETAMINE 10 MG PO TABS: 10.0000 mg | ORAL_TABLET | Freq: Two times a day (BID) | ORAL | Status: DC | PRN

## 2012-06-22 NOTE — Telephone Encounter (Signed)
Notified pt rx ready for pick-up.../lmb 

## 2012-06-22 NOTE — Telephone Encounter (Signed)
Left msg on vm needing refill on her adderral.../lmb 

## 2012-07-06 ENCOUNTER — Other Ambulatory Visit: Payer: Self-pay | Admitting: Internal Medicine

## 2012-07-14 ENCOUNTER — Ambulatory Visit (INDEPENDENT_AMBULATORY_CARE_PROVIDER_SITE_OTHER)
Admission: RE | Admit: 2012-07-14 | Discharge: 2012-07-14 | Disposition: A | Discharge status: Home / Self Care | Payer: BC Managed Care – PPO | Source: Ambulatory Visit | Attending: Internal Medicine | Admitting: Internal Medicine

## 2012-07-14 ENCOUNTER — Ambulatory Visit (INDEPENDENT_AMBULATORY_CARE_PROVIDER_SITE_OTHER): Payer: BC Managed Care – PPO | Admitting: Internal Medicine

## 2012-07-14 ENCOUNTER — Encounter: Payer: Self-pay | Admitting: Internal Medicine

## 2012-07-14 ENCOUNTER — Other Ambulatory Visit (INDEPENDENT_AMBULATORY_CARE_PROVIDER_SITE_OTHER): Payer: BC Managed Care – PPO

## 2012-07-14 VITALS — BP 128/90 | HR 78 | Temp 97.9°F | Resp 14 | Ht 66.5 in | Wt 161.0 lb

## 2012-07-14 DIAGNOSIS — S298XXA Other specified injuries of thorax, initial encounter: Secondary | ICD-10-CM

## 2012-07-14 DIAGNOSIS — R10819 Abdominal tenderness, unspecified site: Secondary | ICD-10-CM

## 2012-07-14 LAB — CBC WITH DIFFERENTIAL/PLATELET
Basophils Relative: 0.3 % (ref 0.0–3.0)
Eosinophils Relative: 0.9 % (ref 0.0–5.0)
HCT: 35.8 % — ABNORMAL LOW (ref 36.0–46.0)
Lymphs Abs: 1.2 10*3/uL (ref 0.7–4.0)
MCV: 88.7 fl (ref 78.0–100.0)
Monocytes Absolute: 0.5 10*3/uL (ref 0.1–1.0)
Neutro Abs: 4 10*3/uL (ref 1.4–7.7)
RBC: 4.03 Mil/uL (ref 3.87–5.11)
WBC: 5.9 10*3/uL (ref 4.5–10.5)

## 2012-07-14 LAB — COMPREHENSIVE METABOLIC PANEL
Albumin: 3.6 g/dL (ref 3.5–5.2)
Alkaline Phosphatase: 64 U/L (ref 39–117)
BUN: 14 mg/dL (ref 6–23)
Creatinine, Ser: 0.6 mg/dL (ref 0.4–1.2)
Glucose, Bld: 82 mg/dL (ref 70–99)
Total Bilirubin: 0.2 mg/dL — ABNORMAL LOW (ref 0.3–1.2)

## 2012-07-14 LAB — URINALYSIS, ROUTINE W REFLEX MICROSCOPIC
Bilirubin Urine: NEGATIVE
Ketones, ur: NEGATIVE
Specific Gravity, Urine: 1.015 (ref 1.000–1.030)
Urine Glucose: NEGATIVE
pH: 7.5 (ref 5.0–8.0)

## 2012-07-14 MED ORDER — OXYCODONE-ACETAMINOPHEN 7.5-325 MG PO TABS: 1.0000 | ORAL_TABLET | ORAL | Status: DC | PRN

## 2012-07-14 NOTE — Progress Notes (Signed)
  Subjective:    Patient ID: [name], female    DOB: 12-31-1955, 56 y.o.   MRN: [id number]  Fall Incident onset: 4 days ago. The fall occurred while standing. She fell from a height of 1 to 2 ft. She landed on concrete. There was no blood loss. Point of impact: right chest wall. Pain location: right chest wall area. The pain is at a severity of 4/10. The pain is moderate. The symptoms are aggravated by movement. Associated symptoms include abdominal pain. Pertinent negatives include no bowel incontinence, fever, headaches, hearing loss, hematuria, loss of consciousness, nausea, numbness, tingling, visual change or vomiting. She has tried NSAID for the symptoms. The treatment provided moderate relief.      Review of Systems  Constitutional: Negative for fever, chills, diaphoresis, activity change, appetite change, fatigue and unexpected weight change.  HENT: Negative.   Eyes: Negative.   Respiratory: Negative for apnea, cough, choking, chest tightness, shortness of breath, wheezing and stridor.   Cardiovascular: Positive for chest pain. Negative for palpitations and leg swelling.  Gastrointestinal: Positive for abdominal pain. Negative for nausea, vomiting, diarrhea, constipation, blood in stool, abdominal distention, anal bleeding, rectal pain and bowel incontinence.  Genitourinary: Negative.  Negative for hematuria.  Musculoskeletal: Negative for myalgias, back pain, joint swelling, arthralgias and gait problem.  Skin: Negative.   Neurological: Negative for dizziness, tingling, loss of consciousness, weakness, numbness and headaches.  Hematological: Negative for adenopathy. Does not bruise/bleed easily.  Psychiatric/Behavioral: Negative.        Objective:   Physical Exam  Vitals reviewed. Constitutional: She is oriented to person, place, and time. She appears well-developed and well-nourished.  Non-toxic appearance. She does not have a sickly appearance. She does not appear ill.  No distress.  HENT:  Head: Normocephalic and atraumatic.  Mouth/Throat: Oropharynx is clear and moist. No oropharyngeal exudate.  Eyes: Conjunctivae normal are normal. Right eye exhibits no discharge. Left eye exhibits no discharge. No scleral icterus.  Neck: Normal range of motion. Neck supple. No JVD present. No tracheal deviation present. No thyromegaly present.  Cardiovascular: Normal rate, regular rhythm, normal heart sounds and intact distal pulses.  Exam reveals no gallop and no friction rub.   No murmur heard. Pulmonary/Chest: Effort normal and breath sounds normal. No stridor. No respiratory distress. She has no rales. Chest wall is not dull to percussion. She exhibits tenderness, bony tenderness, deformity and swelling. She exhibits no mass, no laceration, no crepitus, no edema and no retraction.    Abdominal: Soft. Normal appearance and bowel sounds are normal. She exhibits no shifting dullness, no distension, no fluid wave, no abdominal bruit, no ascites, no pulsatile midline mass and no mass. There is no hepatosplenomegaly, splenomegaly or hepatomegaly. There is tenderness in the right upper quadrant. There is no rigidity, no rebound, no guarding, no CVA tenderness, no tenderness at McBurney's point and negative Murphy's sign. No hernia.  Musculoskeletal: Normal range of motion. She exhibits no edema and no tenderness.  Lymphadenopathy:    She has no cervical adenopathy.  Neurological: She is oriented to person, place, and time.  Skin: Skin is warm and dry. No rash noted. She is not diaphoretic. No erythema. No pallor.  Psychiatric: She has a normal mood and affect. Her behavior is normal. Judgment and thought content normal.          Assessment & Plan:

## 2012-07-14 NOTE — Assessment & Plan Note (Signed)
Will check plain films Will look at [facility] and [facility] to see if there has been an internal injury

## 2012-07-14 NOTE — Patient Instructions (Signed)
Chest Wall Pain Chest wall pain is pain in or around the bones and muscles of your chest. It may take up to 6 weeks to get better. It may take longer if you must stay physically active in your work and activities.  CAUSES  Chest wall pain may happen on its own. However, it may be caused by:  A viral illness like the flu.  Injury.  Coughing.  Exercise.  Arthritis.  Fibromyalgia.  Shingles. HOME CARE INSTRUCTIONS   Avoid overtiring physical activity. Try not to strain or perform activities that cause pain. This includes any activities using your chest or your abdominal and side muscles, especially if heavy weights are used.  Put ice on the sore area.  Put ice in a plastic bag.  Place a towel between your skin and the bag.  Leave the ice on for 15 to 20 minutes per hour while awake for the first 2 days.  Only take over-the-counter or prescription medicines for pain, discomfort, or fever as directed by your caregiver. SEEK IMMEDIATE MEDICAL CARE IF:   Your pain increases, or you are very uncomfortable.  You have a fever.  Your chest pain becomes worse.  You have new, unexplained symptoms.  You have nausea or vomiting.  You feel sweaty or lightheaded.  You have a cough with phlegm (sputum), or you cough up blood. MAKE SURE YOU:   Understand these instructions.  Will watch your condition.  Will get help right away if you are not doing well or get worse. Document Released: 07/28/2005 Document Revised: 10/20/2011 Document Reviewed: 03/24/2011 ExitCare Patient Information 2013 [employer].  

## 2012-07-14 NOTE — Assessment & Plan Note (Signed)
Will check a plain film to look for rib fracture/pulm. Contusion/pneumothorax She will take percocet for pain Will check labs to see if she has injured her kidney or liver

## 2012-07-28 ENCOUNTER — Other Ambulatory Visit: Payer: Self-pay | Admitting: *Deleted

## 2012-07-28 MED ORDER — AMPHETAMINE-DEXTROAMPHETAMINE 10 MG PO TABS: 10.0000 mg | ORAL_TABLET | Freq: Two times a day (BID) | ORAL | Status: DC | PRN

## 2012-07-28 NOTE — Telephone Encounter (Signed)
Notified pt rx ready for pick-up.../lmb 

## 2012-09-03 ENCOUNTER — Other Ambulatory Visit: Payer: Self-pay | Admitting: *Deleted

## 2012-09-03 MED ORDER — AMPHETAMINE-DEXTROAMPHETAMINE 10 MG PO TABS
10.0000 mg | ORAL_TABLET | Freq: Two times a day (BID) | ORAL | Status: DC | PRN
Start: 2012-09-03 — End: 2012-10-18

## 2012-09-03 NOTE — Telephone Encounter (Signed)
Notified pt rx ready for pick-up.../lmb 

## 2012-09-03 NOTE — Telephone Encounter (Signed)
[name], [employer] and placed on desk [employer]

## 2012-09-25 ENCOUNTER — Other Ambulatory Visit: Payer: Self-pay

## 2012-10-12 ENCOUNTER — Encounter: Payer: Self-pay | Admitting: Internal Medicine

## 2012-10-14 ENCOUNTER — Other Ambulatory Visit: Payer: Self-pay | Admitting: Internal Medicine

## 2012-10-18 ENCOUNTER — Telehealth: Payer: Self-pay | Admitting: Internal Medicine

## 2012-10-18 MED ORDER — AMPHETAMINE-DEXTROAMPHETAMINE 10 MG PO TABS: 10.0000 mg | ORAL_TABLET | Freq: Two times a day (BID) | ORAL | Status: DC | PRN

## 2012-10-18 NOTE — Telephone Encounter (Signed)
ok 

## 2012-10-18 NOTE — Telephone Encounter (Signed)
Called pt no answer [facility] rx ready for pick-up.../lmb 

## 2012-10-18 NOTE — Telephone Encounter (Signed)
The pt is hoping to get an rx for adderall refilled.  Her callback number is [phone]  Thanks!

## 2012-11-18 ENCOUNTER — Other Ambulatory Visit: Payer: Self-pay | Admitting: *Deleted

## 2012-11-18 MED ORDER — AMPHETAMINE-DEXTROAMPHETAMINE 10 MG PO TABS: 10.0000 mg | ORAL_TABLET | Freq: Two times a day (BID) | ORAL | Status: DC | PRN

## 2012-11-18 NOTE — Telephone Encounter (Signed)
Notified pt rx ready for pick-up.../lmb 

## 2012-11-28 ENCOUNTER — Other Ambulatory Visit: Payer: Self-pay | Admitting: Internal Medicine

## 2012-12-20 ENCOUNTER — Telehealth: Payer: Self-pay | Admitting: Internal Medicine

## 2012-12-20 MED ORDER — PREDNISONE (PAK) 10 MG PO TABS: 10.0000 mg | ORAL_TABLET | ORAL | Status: DC

## 2012-12-20 NOTE — Telephone Encounter (Signed)
Pt is aware.  

## 2012-12-20 NOTE — Telephone Encounter (Signed)
Pt is requesting steroids for poison ivy.  Declined an ov.

## 2012-12-20 NOTE — Telephone Encounter (Signed)
6 date Pred Pak sent to local pharmacy OV if unimproved

## 2012-12-22 ENCOUNTER — Other Ambulatory Visit: Payer: Self-pay | Admitting: *Deleted

## 2012-12-22 MED ORDER — AMPHETAMINE-DEXTROAMPHETAMINE 10 MG PO TABS: 10.0000 mg | ORAL_TABLET | Freq: Two times a day (BID) | ORAL | Status: DC | PRN

## 2012-12-22 NOTE — Telephone Encounter (Signed)
Called pt no answer [facility] rx ready for pick-up.../lmb 

## 2013-01-08 ENCOUNTER — Other Ambulatory Visit: Payer: Self-pay | Admitting: Internal Medicine

## 2013-01-11 ENCOUNTER — Other Ambulatory Visit: Payer: Self-pay | Admitting: Internal Medicine

## 2013-01-21 ENCOUNTER — Ambulatory Visit (INDEPENDENT_AMBULATORY_CARE_PROVIDER_SITE_OTHER): Payer: BC Managed Care – PPO | Admitting: Internal Medicine

## 2013-01-21 ENCOUNTER — Encounter: Payer: Self-pay | Admitting: Internal Medicine

## 2013-01-21 VITALS — BP 128/86 | HR 71 | Temp 97.0°F

## 2013-01-21 DIAGNOSIS — L255 Unspecified contact dermatitis due to plants, except food: Secondary | ICD-10-CM

## 2013-01-21 DIAGNOSIS — L237 Allergic contact dermatitis due to plants, except food: Secondary | ICD-10-CM

## 2013-01-21 DIAGNOSIS — L259 Unspecified contact dermatitis, unspecified cause: Secondary | ICD-10-CM

## 2013-01-21 MED ORDER — PREDNISONE 10 MG PO TABS: ORAL_TABLET | ORAL | Status: DC

## 2013-01-21 NOTE — Patient Instructions (Signed)
Poison Oak Poison oak is an inflammation of the skin (contact dermatitis). It is caused by contact with the allergens on the leaves of the oak (toxicodendron) plants. Depending on your sensitivity, the rash may consist simply of redness and itching, or it may also progress to blisters which may break open (rupture). These must be well cared for to prevent secondary germ (bacterial) infection as these infections can lead to scarring. The eyes may also get puffy. The puffiness is worst in the morning and gets better as the day progresses. Healing is best accomplished by keeping any open areas dry, clean, covered with a bandage, and covered with an antibacterial ointment if needed. Without secondary infection, this dermatitis usually heals without scarring within 2 to 3 weeks without treatment. HOME CARE INSTRUCTIONS When you have been exposed to poison oak, it is very important to thoroughly wash with soap and water as soon as the exposure has been discovered. You have about one half hour to remove the plant resin before it will cause the rash. This cleaning will quickly destroy the oil or antigen on the skin (the antigen is what causes the rash). Wash aggressively under the fingernails as any plant resin still there will continue to spread the rash. Do not rub skin vigorously when washing affected area. Poison oak cannot spread if no oil from the plant remains on your body. Rash that has progressed to weeping sores (lesions) will not spread the rash unless you have not washed thoroughly. It is also important to clean any clothes you have been wearing as they may carry active allergens which will spread the rash, even several days later. Avoidance of the plant in the future is the best measure. Poison oak plants can be recognized by the number of leaves. Generally, poison oak has three leaves with flowering branches on a single stem. Diphenhydramine may be purchased over the counter and used as needed for  itching. Do not drive with this medication if it makes you drowsy. Ask your caregiver about medication for children. SEEK IMMEDIATE MEDICAL CARE IF:   Open areas of the rash develop.  You notice redness extending beyond the area of the rash.  There is a pus like discharge.  There is increased pain.  Other signs of infection develop (such as fever). Document Released: 02/01/2003 Document Revised: 10/20/2011 Document Reviewed: 06/13/2009 ExitCare Patient Information 2014 [employer].  

## 2013-01-21 NOTE — Progress Notes (Signed)
Subjective:    Patient ID: [name], female    DOB: 1956/04/24, 57 y.o.   MRN: [id number]  HPI  Pt presents to the clinic today with c/o a rash. This started about 3 weeks ago. She has been working out in the yard cutting down trees. She did notice poison oak in the area. She has had similar reactions before. The rash is very itchy. She has been putting calamine lotion on the area.  Review of Systems      Past Medical History  Diagnosis Date  . ADD   . ALLERGIC RHINITIS   . GERD   . HOMOCYSTINEMIA   . HYPERTENSION   . IBS   . Incidental lung nodule 02/2011 ct    unchanged 07/16/11 CT, 6 mo f/u  planned    Current Outpatient Prescriptions  Medication Sig Dispense Refill  . amphetamine-dextroamphetamine (ADDERALL) 10 MG tablet Take 1-2 tablets (10-20 mg total) by mouth 2 (two) times daily as needed.  120 tablet  0  . estradiol (ESTRACE) 1 MG tablet TAKE 1 & 1/2 TABLETS (1.5 MG TOTAL) BY MOUTH DAILY.  45 tablet  0  . loratadine (CLARITIN) 10 MG tablet Take 10 mg by mouth daily as needed.      . Melatonin 3 MG CAPS Take 1 capsule by mouth at bedtime as needed.      Marland Kitchen omeprazole (PRILOSEC OTC) 20 MG tablet Take 20 mg by mouth daily as needed.        . triamcinolone cream (KENALOG) 0.1 % APPLY TOPICALLY 2 (TWO) TIMES DAILY AS NEEDED.  30 g  0  . triamterene-hydrochlorothiazide (MAXZIDE-25) 37.5-25 MG per tablet Take 1 each (1 tablet total) by mouth daily.  90 tablet  3  . verapamil (CALAN) 120 MG tablet Take 120 mg by mouth 2 (two) times daily.       No current facility-administered medications for this visit.    Allergies  Allergen Reactions  . Nifedipine     REACTION: increased HR    Family History  Problem Relation Age of Onset  . Coronary artery disease Mother   . Hypertension Mother   . Coronary artery disease Father   . Hypertension Father   . Diabetes Father   . Stroke Father   . Coronary artery disease Maternal Aunt   . Stroke Maternal Aunt   . Stroke  Maternal Uncle   . Stroke Paternal Uncle   . Hypertension Maternal Grandmother   . Hypertension Maternal Grandfather   . Colon cancer Maternal Grandfather   . Hypertension Paternal Grandmother   . Hypertension Paternal Grandfather   . Colon cancer Paternal Grandfather   . Colon cancer Cousin     History   Social History  . Marital Status: Married    Spouse Name: N/A    Number of Children: 3  . Years of Education: N/A   Occupational History  . sales    Social History Main Topics  . Smoking status: Former Smoker    Types: Cigarettes    Start date: 08/11/1976  . Smokeless tobacco: Never Used     Comment: Single-occassional boyfriend. 3 children-yougest son with cystic fibrosis. currently unemployed since 01/2010. 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 Alcohol Use: Yes     Comment: a glass of winw a night  . Drug Use: No  . Sexually Active: Not on file   Other Topics Concern  . Not on file   Social History Narrative  .  No narrative on file     Constitutional: Denies fever, malaise, fatigue, headache or abrupt weight changes.   Skin: Pt reports rash. Denies redness, lesions or ulcercations.    No other specific complaints in a complete review of systems (except as listed in HPI above).  Objective:   Physical Exam   BP 128/86  Pulse 71  Temp(Src) 97 F (36.1 C) (Oral)  SpO2 97% Wt Readings from Last 3 Encounters:  07/14/12 161 lb (73.029 kg)  05/26/12 156 lb 8 oz (70.988 kg)  01/08/12 162 lb 12.8 oz (73.846 kg)    General: Appears their stated age, well developed, well nourished in NAD. Skin: Warm, dry and intact. Generalized maculopapular rash noted on bilateral forearms and legs. Cardiovascular: Normal rate and rhythm. S1,S2 noted.  No murmur, rubs or gallops noted. No JVD or BLE edema. No carotid bruits noted. Pulmonary/Chest: Normal effort and positive vesicular breath sounds. No respiratory distress. No wheezes, rales or ronchi noted.    BMET    Component Value Date/Time   NA 138 07/14/2012 1138   K 3.7 07/14/2012 1138   CL 102 07/14/2012 1138   CO2 28 07/14/2012 1138   GLUCOSE 82 07/14/2012 1138   BUN 14 07/14/2012 1138   CREATININE 0.6 07/14/2012 1138   CALCIUM 8.8 07/14/2012 1138   GFRNONAA >90 04/24/2012 1742   GFRAA >90 04/24/2012 1742    Lipid Panel     Component Value Date/Time   CHOL 168 01/06/2012 0824   TRIG 52.0 01/06/2012 0824   HDL 72.40 01/06/2012 0824   CHOLHDL 2 01/06/2012 0824   VLDL 10.4 01/06/2012 0824   LDLCALC 85 01/06/2012 0824    CBC    Component Value Date/Time   WBC 5.9 07/14/2012 1138   RBC 4.03 07/14/2012 1138   HGB 11.9* 07/14/2012 1138   HCT 35.8* 07/14/2012 1138   PLT 265.0 07/14/2012 1138   MCV 88.7 07/14/2012 1138   MCH 28.9 04/24/2012 1742   MCHC 33.1 07/14/2012 1138   RDW 15.2* 07/14/2012 1138   LYMPHSABS 1.2 07/14/2012 1138   MONOABS 0.5 07/14/2012 1138   EOSABS 0.1 07/14/2012 1138   BASOSABS 0.0 07/14/2012 1138    Hgb A1C No results found for this basename: HGBA1C        Assessment & Plan:   Contact dermatitis secondary to poison oak:  eRx for pred taper Avoid scratching if you can Benadryl at night  RTC as needed or if symptoms persist or worsen

## 2013-01-25 ENCOUNTER — Telehealth: Payer: Self-pay | Admitting: *Deleted

## 2013-01-25 MED ORDER — AMPHETAMINE-DEXTROAMPHETAMINE 10 MG PO TABS: 10.0000 mg | ORAL_TABLET | Freq: Two times a day (BID) | ORAL | Status: DC | PRN

## 2013-01-25 NOTE — Telephone Encounter (Signed)
Pt informed rx ready for pickup, placed upfront in cabinet.  

## 2013-01-25 NOTE — Telephone Encounter (Signed)
Ok to refill 

## 2013-01-25 NOTE — Telephone Encounter (Signed)
Pt requesting refill of generic Adderall 10mg -last written 12/22/2012 #120 with 0 refill-please advise in VAL's absence. Thanks!

## 2013-02-09 ENCOUNTER — Telehealth: Payer: Self-pay | Admitting: *Deleted

## 2013-02-09 MED ORDER — PREDNISONE (PAK) 10 MG PO TABS: 10.0000 mg | ORAL_TABLET | ORAL | Status: DC

## 2013-02-09 NOTE — Telephone Encounter (Signed)
Left msg on vm have poison oak again requesting refill on prednisone. Finally finish cleaning yard work...lmb

## 2013-02-09 NOTE — Telephone Encounter (Signed)
Ok erx done

## 2013-02-09 NOTE — Telephone Encounter (Signed)
Notified pt rx sent to her pharmacy...lmb

## 2013-02-24 ENCOUNTER — Other Ambulatory Visit: Payer: Self-pay | Admitting: Internal Medicine

## 2013-03-01 ENCOUNTER — Telehealth: Payer: Self-pay | Admitting: Internal Medicine

## 2013-03-01 MED ORDER — AMPHETAMINE-DEXTROAMPHETAMINE 10 MG PO TABS: 10.0000 mg | ORAL_TABLET | Freq: Two times a day (BID) | ORAL | Status: DC | PRN

## 2013-03-01 NOTE — Telephone Encounter (Signed)
ok 

## 2013-03-01 NOTE — Telephone Encounter (Signed)
Called pt no answer [facility] rx ready for pick-up.../lmb 

## 2013-03-01 NOTE — Telephone Encounter (Signed)
Requesting a refill on adderall.  Pharmacy sent request yesterday.

## 2013-03-15 ENCOUNTER — Encounter: Payer: Self-pay | Admitting: Internal Medicine

## 2013-03-15 ENCOUNTER — Telehealth: Payer: Self-pay | Admitting: *Deleted

## 2013-03-15 ENCOUNTER — Ambulatory Visit (INDEPENDENT_AMBULATORY_CARE_PROVIDER_SITE_OTHER): Payer: BC Managed Care – PPO | Admitting: Internal Medicine

## 2013-03-15 VITALS — BP 110/82 | HR 86 | Temp 97.5°F | Ht 66.5 in | Wt 162.8 lb

## 2013-03-15 DIAGNOSIS — M6580 Other synovitis and tenosynovitis, unspecified site: Secondary | ICD-10-CM

## 2013-03-15 DIAGNOSIS — M778 Other enthesopathies, not elsewhere classified: Secondary | ICD-10-CM

## 2013-03-15 DIAGNOSIS — R911 Solitary pulmonary nodule: Secondary | ICD-10-CM

## 2013-03-15 DIAGNOSIS — Z1239 Encounter for other screening for malignant neoplasm of breast: Secondary | ICD-10-CM

## 2013-03-15 DIAGNOSIS — I10 Essential (primary) hypertension: Secondary | ICD-10-CM

## 2013-03-15 DIAGNOSIS — Z0000 Encounter for general adult medical examination without abnormal findings: Secondary | ICD-10-CM

## 2013-03-15 MED ORDER — DICLOFENAC SODIUM 1 % TD GEL: 2.0000 g | Freq: Four times a day (QID) | TRANSDERMAL | Status: DC

## 2013-03-15 NOTE — Telephone Encounter (Signed)
Received fax pt needing PA on the Voltaren gel. Completed PA on cover my meds. PA was submitted  & states will be contacted with approval status within 3 days. If don't hear back can call [phone]...lmb

## 2013-03-15 NOTE — Telephone Encounter (Signed)
Received fax from [employer] stating this [employer] group is not enrolled with [employer]. pls contact correct carrier. So i call [phone] # that was on form from pharmacy. Was told that [place] does there own PA was given # [phone] spoke with rep Tameka Faxed over new PA for the voltaren gel. Was completed and fax back. Waiting on approval status...Raechel Chute

## 2013-03-15 NOTE — Progress Notes (Signed)
Subjective:    Patient ID: [name], female    DOB: 12/17/1955, 57 y.o.   MRN: [id number]  HPI  patient is here today for annual physical. Patient feels well overall   reports left lateral elbow pain, precipitated by over use. Improving with over-the-counter ibuprofen but not resolved. No swelling, no direct trauma  Also reports left greater than right carpal tunnel symptoms, worse at night, improving with use of wrist splint to immobilize at night  Also due for annual CT followup regarding incidental lung nodules identified 2012  Past Medical History  Diagnosis Date  . ADD   . ALLERGIC RHINITIS   . GERD   . HOMOCYSTINEMIA   . HYPERTENSION   . IBS   . Incidental lung nodule 02/2011 ct    unchanged 07/16/11 CT, 6 mo f/u  planned   Family History  Problem Relation Age of Onset  . Coronary artery disease Mother   . Hypertension Mother   . Coronary artery disease Father   . Hypertension Father   . Diabetes Father   . Stroke Father   . Coronary artery disease Maternal Aunt   . Stroke Maternal Aunt   . Stroke Maternal Uncle   . Stroke Paternal Uncle   . Hypertension Maternal Grandmother   . Hypertension Maternal Grandfather   . Colon cancer Maternal Grandfather   . Hypertension Paternal Grandmother   . Hypertension Paternal Grandfather   . Colon cancer Paternal Grandfather   . Colon cancer Cousin    History  Substance Use Topics  . Smoking status: Former Smoker    Types: Cigarettes    Start date: 08/11/1976  . Smokeless tobacco: Never Used     Comment: Single-occassional boyfriend. 3 children-yougest son with cystic fibrosis. currently unemployed since 01/2010. 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 Alcohol Use: Yes     Comment: a glass of winw a night    Review of Systems  Constitutional: Negative for fever or weight change.  Respiratory: Negative for cough and shortness of breath.   Cardiovascular: Negative for chest pain or palpitations.   Gastrointestinal: Negative for abdominal pain, no bowel changes.  Musculoskeletal: Negative for gait problem or joint swelling.  Skin: Negative for rash.  Neurological: Negative for dizziness or headache.  No other specific complaints in a complete review of systems (except as listed in HPI above).     Objective:   Physical Exam  BP 110/82  Pulse 86  Temp(Src) 97.5 F (36.4 C) (Oral)  Ht 5' 6.5" (1.689 m)  Wt 162 lb 12.8 oz (73.846 kg)  BMI 25.89 kg/m2  SpO2 97% Wt Readings from Last 3 Encounters:  03/15/13 162 lb 12.8 oz (73.846 kg)  07/14/12 161 lb (73.029 kg)  05/26/12 156 lb 8 oz (70.988 kg)   Constitutional: She appears well-developed and well-nourished. No distress.  HENT: Head: Normocephalic and atraumatic. Ears: B TMs ok, no erythema or effusion; Nose: Nose normal. Mouth/Throat: Oropharynx is clear and moist. No oropharyngeal exudate.  Eyes: Conjunctivae and EOM are normal. Pupils are equal, round, and reactive to light. No scleral icterus.  Neck: Normal range of motion. Neck supple. No JVD present. No thyromegaly present.  Cardiovascular: Normal rate, regular rhythm and normal heart sounds.  No murmur heard. No BLE edema. Pulmonary/Chest: Effort normal and breath sounds normal. No respiratory distress. She has no wheezes.  Abdominal: Soft. Bowel sounds are normal. She exhibits no distension. There is no tenderness. no masses Musculoskeletal: L Elbow: tender  to palpation over lateral epicondyle. Pain with resistance to ECRB extension, wrist extension and supination. FROM, no effusion, redness or swelling. Neurovascularly intact. No open wounds. Normal range of motion, no joint effusions. No gross deformities Neurological: She is alert and oriented to person, place, and time. No cranial nerve deficit. Coordination normal.  Skin: Skin is warm and dry. No rash noted. No erythema.  Psychiatric: She has a normal mood and affect. Her behavior is normal. Judgment and thought  content normal.   Lab Results  Component Value Date   WBC 5.9 07/14/2012   HGB 11.9* 07/14/2012   HCT 35.8* 07/14/2012   PLT 265.0 07/14/2012   GLUCOSE 82 07/14/2012   CHOL 168 01/06/2012   TRIG 52.0 01/06/2012   HDL 72.40 01/06/2012   LDLDIRECT 111.7 12/03/2007   LDLCALC 85 01/06/2012   ALT 36* 07/14/2012   AST 33 07/14/2012   NA 138 07/14/2012   K 3.7 07/14/2012   CL 102 07/14/2012   CREATININE 0.6 07/14/2012   BUN 14 07/14/2012   CO2 28 07/14/2012   TSH 1.84 01/06/2012   INR 1.0 12/06/2008   ECG: NSR @ 83 bpm, nonsp ST changes     Assessment & Plan:  CPX/v70.0 - Patient has been counseled on age-appropriate routine health concerns for screening and prevention. These are reviewed and up-to-date. Immunizations are up-to-date or declined. Labs and ECG reviewed.  L elbow tendonitis - precipitated by overuse, also complicated by left carpal tunnel symptoms, improving with wrist immobility splint- we'll use topical anti-inflammatories to augment over-the-counter oral ibuprofen as ongoing. Patient will call for referral to hand needed if symptoms worse or unimproved

## 2013-03-15 NOTE — Patient Instructions (Signed)
It was good to see you today. We have reviewed your interval medical history including labs and tests today we'll make referral for follow up CT chest. Our office will contact you regarding appointment(s) once made.  Health Maintenance reviewed - will help schedule your mammogram - all recommended immunizations and age-appropriate screenings are up-to-date. Medications reviewed, no changes at this time. Use new Voltaren gel for tendonitis as needed - Refill on other medication(s) as discussed today. Your prescription(s) have been submitted to your pharmacy. Please take as directed and contact our office if you believe you are having problem(s) with the medication(s). Please schedule followup in 12 months for physical and labs, call sooner if problems.  Health Maintenance, Females A healthy lifestyle and preventative care can promote health and wellness.  Maintain regular health, dental, and eye exams.  Eat a healthy diet. Foods like vegetables, fruits, whole grains, low-fat dairy products, and lean protein foods contain the nutrients you need without too many calories. Decrease your intake of foods high in solid fats, added sugars, and salt. Get information about a proper diet from your caregiver, if necessary.  Regular physical exercise is one of the most important things you can do for your health. Most adults should get at least 150 minutes of moderate-intensity exercise (any activity that increases your heart rate and causes you to sweat) each week. In addition, most adults need muscle-strengthening exercises on 2 or more days a week.   Maintain a healthy weight. The body mass index (BMI) is a screening tool to identify possible weight problems. It provides an estimate of body fat based on height and weight. Your caregiver can help determine your BMI, and can help you achieve or maintain a healthy weight. For adults 20 years and older:  A BMI below 18.5 is considered underweight.  A BMI of  18.5 to 24.9 is normal.  A BMI of 25 to 29.9 is considered overweight.  A BMI of 30 and above is considered obese.  Maintain normal blood lipids and cholesterol by exercising and minimizing your intake of saturated fat. Eat a balanced diet with plenty of fruits and vegetables. Blood tests for lipids and cholesterol should begin at age 66 and be repeated every 5 years. If your lipid or cholesterol levels are high, you are over 50, or you are a high risk for heart disease, you may need your cholesterol levels checked more frequently.Ongoing high lipid and cholesterol levels should be treated with medicines if diet and exercise are not effective.  If you smoke, find out from your caregiver how to quit. If you do not use tobacco, do not start.  If you are pregnant, do not drink alcohol. If you are breastfeeding, be very cautious about drinking alcohol. If you are not pregnant and choose to drink alcohol, do not exceed 1 drink per day. One drink is considered to be 12 ounces (355 mL) of beer, 5 ounces (148 mL) of wine, or 1.5 ounces (44 mL) of liquor.  Avoid use of street drugs. Do not share needles with anyone. Ask for help if you need support or instructions about stopping the use of drugs.  High blood pressure causes heart disease and increases the risk of stroke. Blood pressure should be checked at least every 1 to 2 years. Ongoing high blood pressure should be treated with medicines, if weight loss and exercise are not effective.  If you are 11 to 55 years old, ask your caregiver if you should take aspirin to  prevent strokes.  Diabetes screening involves taking a blood sample to check your fasting blood sugar level. This should be done once every 3 years, after age 39, if you are within normal weight and without risk factors for diabetes. Testing should be considered at a younger age or be carried out more frequently if you are overweight and have at least 1 risk factor for diabetes.  Breast  cancer screening is essential preventative care for women. You should practice "breast self-awareness." This means understanding the normal appearance and feel of your breasts and may include breast self-examination. Any changes detected, no matter how small, should be reported to a caregiver. Women in their 46s and 30s should have a clinical breast exam (CBE) by a caregiver as part of a regular health exam every 1 to 3 years. After age 13, women should have a CBE every year. Starting at age 62, women should consider having a mammogram (breast X-ray) every year. Women who have a family history of breast cancer should talk to their caregiver about genetic screening. Women at a high risk of breast cancer should talk to their caregiver about having an MRI and a mammogram every year.  The Pap test is a screening test for cervical cancer. Women should have a Pap test starting at age 58. Between ages 83 and 30, Pap tests should be repeated every 2 years. Beginning at age 16, you should have a Pap test every 3 years as long as the past 3 Pap tests have been normal. If you had a hysterectomy for a problem that was not cancer or a condition that could lead to cancer, then you no longer need Pap tests. If you are between ages 38 and 70, and you have had normal Pap tests going back 10 years, you no longer need Pap tests. If you have had past treatment for cervical cancer or a condition that could lead to cancer, you need Pap tests and screening for cancer for at least 20 years after your treatment. If Pap tests have been discontinued, risk factors (such as a new sexual partner) need to be reassessed to determine if screening should be resumed. Some women have medical problems that increase the chance of getting cervical cancer. In these cases, your caregiver may recommend more frequent screening and Pap tests.  The human papillomavirus (HPV) test is an additional test that may be used for cervical cancer screening. The HPV  test looks for the virus that can cause the cell changes on the cervix. The cells collected during the Pap test can be tested for HPV. The HPV test could be used to screen women aged 62 years and older, and should be used in women of any age who have unclear Pap test results. After the age of 73, women should have HPV testing at the same frequency as a Pap test.  Colorectal cancer can be detected and often prevented. Most routine colorectal cancer screening begins at the age of 49 and continues through age 33. However, your caregiver may recommend screening at an earlier age if you have risk factors for colon cancer. On a yearly basis, your caregiver may provide home test kits to check for hidden blood in the stool. Use of a small camera at the end of a tube, to directly examine the colon (sigmoidoscopy or colonoscopy), can detect the earliest forms of colorectal cancer. Talk to your caregiver about this at age 68, when routine screening begins. Direct examination of the colon should be  repeated every 5 to 10 years through age 19, unless early forms of pre-cancerous polyps or small growths are found.  Hepatitis C blood testing is recommended for all people born from [birth year] through 1965 and any individual with known risks for hepatitis C.  Practice safe sex. Use condoms and avoid high-risk sexual practices to reduce the spread of sexually transmitted infections (STIs). Sexually active women aged 40 and younger should be checked for Chlamydia, which is a common sexually transmitted infection. Older women with new or multiple partners should also be tested for Chlamydia. Testing for other STIs is recommended if you are sexually active and at increased risk.  Osteoporosis is a disease in which the bones lose minerals and strength with aging. This can result in serious bone fractures. The risk of osteoporosis can be identified using a bone density scan. Women ages 41 and over and women at risk for fractures or  osteoporosis should discuss screening with their caregivers. Ask your caregiver whether you should be taking a calcium supplement or vitamin D to reduce the rate of osteoporosis.  Menopause can be associated with physical symptoms and risks. Hormone replacement therapy is available to decrease symptoms and risks. You should talk to your caregiver about whether hormone replacement therapy is right for you.  Use sunscreen with a sun protection factor (SPF) of 30 or greater. Apply sunscreen liberally and repeatedly throughout the day. You should seek shade when your shadow is shorter than you. Protect yourself by wearing long sleeves, pants, a wide-brimmed hat, and sunglasses year round, whenever you are outdoors.  Notify your caregiver of new moles or changes in moles, especially if there is a change in shape or color. Also notify your caregiver if a mole is larger than the size of a pencil eraser.  Stay current with your immunizations. Document Released: 02/10/2011 Document Revised: 10/20/2011 Document Reviewed: 02/10/2011 [facility] Patient Information 2014 [place], [place]. Tendinitis Tendinitis is swelling and inflammation of the tendons. Tendons are band-like tissues that connect muscle to bone. Tendinitis commonly occurs in the:   Shoulders (rotator cuff).  Heels (Achilles tendon).  Elbows (triceps tendon). CAUSES Tendinitis is usually caused by overusing the tendon, muscles, and joints involved. When the tissue surrounding a tendon (synovium) becomes inflamed, it is called tenosynovitis. Tendinitis commonly develops in people whose jobs require repetitive motions. SYMPTOMS  Pain.  Tenderness.  Mild swelling. DIAGNOSIS Tendinitis is usually diagnosed by physical exam. Your caregiver may also order X-rays or other imaging tests. TREATMENT Your caregiver may recommend certain medicines or exercises for your treatment. HOME CARE INSTRUCTIONS   Use a sling or splint for as long as  directed by your caregiver until the pain decreases.  Put ice on the injured area.  Put ice in a plastic bag.  Place a towel between your skin and the bag.  Leave the ice on for 15-20 minutes, 3-4 times a day.  Avoid using the limb while the tendon is painful. Perform gentle range of motion exercises only as directed by your caregiver. Stop exercises if pain or discomfort increase, unless directed otherwise by your caregiver.  Only take over-the-counter or prescription medicines for pain, discomfort, or fever as directed by your caregiver. SEEK MEDICAL CARE IF:   Your pain and swelling increase.  You develop new, unexplained symptoms, especially increased numbness in the hands. MAKE SURE YOU:   Understand these instructions.  Will watch your condition.  Will get help right away if you are not doing well or get worse.  Document Released: 07/25/2000 Document Revised: 10/20/2011 Document Reviewed: 10/14/2010 [facility] Patient Information 2014 [place], [place].

## 2013-03-15 NOTE — Assessment & Plan Note (Signed)
BP Readings from Last 3 Encounters:  03/15/13 110/82  01/21/13 128/86  07/14/12 128/90   The current medical regimen is effective;  continue present plan and medications.

## 2013-03-17 ENCOUNTER — Ambulatory Visit (INDEPENDENT_AMBULATORY_CARE_PROVIDER_SITE_OTHER)
Admission: RE | Admit: 2013-03-17 | Discharge: 2013-03-17 | Disposition: A | Discharge status: Home / Self Care | Payer: BC Managed Care – PPO | Source: Ambulatory Visit | Attending: Internal Medicine | Admitting: Internal Medicine

## 2013-03-17 ENCOUNTER — Encounter: Payer: Self-pay | Admitting: Internal Medicine

## 2013-03-17 DIAGNOSIS — R911 Solitary pulmonary nodule: Secondary | ICD-10-CM

## 2013-03-17 MED ORDER — IOHEXOL 300 MG/ML  SOLN
80.0000 mL | Freq: Once | INTRAMUSCULAR | Status: AC | PRN
  Administered 2013-03-17: 80 mL via INTRAVENOUS

## 2013-03-18 ENCOUNTER — Telehealth: Payer: Self-pay | Admitting: *Deleted

## 2013-03-18 ENCOUNTER — Telehealth: Payer: Self-pay | Admitting: Internal Medicine

## 2013-03-18 ENCOUNTER — Other Ambulatory Visit (INDEPENDENT_AMBULATORY_CARE_PROVIDER_SITE_OTHER): Payer: BC Managed Care – PPO

## 2013-03-18 ENCOUNTER — Other Ambulatory Visit: Payer: Self-pay | Admitting: *Deleted

## 2013-03-18 ENCOUNTER — Other Ambulatory Visit: Payer: Self-pay | Admitting: Internal Medicine

## 2013-03-18 DIAGNOSIS — Z0000 Encounter for general adult medical examination without abnormal findings: Secondary | ICD-10-CM

## 2013-03-18 LAB — URINALYSIS, ROUTINE W REFLEX MICROSCOPIC
Ketones, ur: NEGATIVE
RBC / HPF: NONE SEEN (ref 0–?)
Specific Gravity, Urine: 1.02 (ref 1.000–1.030)
Urine Glucose: NEGATIVE
Urobilinogen, UA: 0.2 (ref 0.0–1.0)

## 2013-03-18 LAB — LIPID PANEL
HDL: 76.7 mg/dL (ref >39.00)
LDL Cholesterol: 95 mg/dL (ref 0–99)
Total CHOL/HDL Ratio: 2
Triglycerides: 48 mg/dL (ref 0.0–149.0)
VLDL: 9.6 mg/dL (ref 0.0–40.0)

## 2013-03-18 LAB — CBC WITH DIFFERENTIAL/PLATELET
Basophils Relative: 0.7 % (ref 0.0–3.0)
Eosinophils Absolute: 0.1 10*3/uL (ref 0.0–0.7)
HCT: 39 % (ref 36.0–46.0)
Hemoglobin: 12.8 g/dL (ref 12.0–15.0)
Lymphocytes Relative: 28.2 % (ref 12.0–46.0)
MCHC: 32.9 g/dL (ref 30.0–36.0)
MCV: 87 fl (ref 78.0–100.0)
Neutro Abs: 2.1 10*3/uL (ref 1.4–7.7)
RBC: 4.48 Mil/uL (ref 3.87–5.11)

## 2013-03-18 LAB — BASIC METABOLIC PANEL
CO2: 29 mEq/L (ref 19–32)
Calcium: 9.1 mg/dL (ref 8.4–10.5)
Chloride: 102 mEq/L (ref 96–112)
Potassium: 3.4 mEq/L — ABNORMAL LOW (ref 3.5–5.1)
Sodium: 139 mEq/L (ref 135–145)

## 2013-03-18 LAB — HEPATIC FUNCTION PANEL: Albumin: 3.8 g/dL (ref 3.5–5.2)

## 2013-03-18 MED ORDER — DICLOFENAC SODIUM 1.5 % TD SOLN: 2.0000 [drp] | Freq: Three times a day (TID) | TRANSDERMAL | Status: DC | PRN

## 2013-03-18 MED ORDER — DICLOFENAC SODIUM ER 100 MG PO TB24: 100.0000 mg | ORAL_TABLET | Freq: Every day | ORAL | Status: DC

## 2013-03-18 NOTE — Telephone Encounter (Signed)
See previous msg pt has been notified of PA....Raechel Chute

## 2013-03-18 NOTE — Telephone Encounter (Signed)
Will try to switch to Pennsaid cream

## 2013-03-18 NOTE — Telephone Encounter (Signed)
Switched to diclofenac oral tabs

## 2013-03-18 NOTE — Telephone Encounter (Signed)
Patient said that last two rx's for tendinitis has been denied by insurance.  Patient said that the Pharmacy suggested a oral time release version of the last topical rx prescribed that would probably be covered by her insurance.  Patient is not sure of what the name of the medication is.  Please give a call in regards.

## 2013-03-18 NOTE — Telephone Encounter (Signed)
Received PA back med has been denied it states request does not meet the definition of medical necessity  Found in member benefit plan. This drug is being use to treat a condition other than ostearthritis of the elbow, wrist, hand, knee or ankle. Is there an alternative pt can take...Raechel Chute

## 2013-03-18 NOTE — Telephone Encounter (Signed)
A user error has taken place: wrong patient

## 2013-03-18 NOTE — Telephone Encounter (Signed)
Called pt no answer LMOM with Regina response.../lmb 

## 2013-03-18 NOTE — Telephone Encounter (Signed)
Received fax needing a PA on pennsaid as well. Do you want to go ahead and rx something oral...lmb

## 2013-03-18 NOTE — Telephone Encounter (Signed)
Lets try the penssaid, if not covered we can try the oral tablet

## 2013-03-31 ENCOUNTER — Other Ambulatory Visit: Payer: Self-pay | Admitting: Internal Medicine

## 2013-03-31 ENCOUNTER — Telehealth: Payer: Self-pay | Admitting: *Deleted

## 2013-03-31 NOTE — Telephone Encounter (Signed)
Refill request for Adderall 10mg .  Last OV 8.5.14 Last filled 7.22.14

## 2013-04-01 ENCOUNTER — Ambulatory Visit: Payer: BC Managed Care – PPO

## 2013-04-01 MED ORDER — AMPHETAMINE-DEXTROAMPHETAMINE 10 MG PO TABS: 10.0000 mg | ORAL_TABLET | Freq: Two times a day (BID) | ORAL | Status: DC | PRN

## 2013-04-01 MED ORDER — ESTRADIOL 1 MG PO TABS: 1.5000 mg | ORAL_TABLET | Freq: Every day | ORAL | Status: DC

## 2013-04-01 NOTE — Telephone Encounter (Signed)
ok 

## 2013-04-01 NOTE — Addendum Note (Signed)
Addended by: Edwena Felty T on: 04/01/2013 04:18 PM   Modules accepted: Orders

## 2013-04-01 NOTE — Telephone Encounter (Signed)
Re-printed & signed under Dr. Yetta Barre.

## 2013-04-25 ENCOUNTER — Ambulatory Visit: Payer: BC Managed Care – PPO

## 2013-05-04 ENCOUNTER — Other Ambulatory Visit: Payer: Self-pay | Admitting: *Deleted

## 2013-05-04 MED ORDER — AMPHETAMINE-DEXTROAMPHETAMINE 10 MG PO TABS: 10.0000 mg | ORAL_TABLET | Freq: Two times a day (BID) | ORAL | Status: DC | PRN

## 2013-05-04 NOTE — Telephone Encounter (Signed)
Called pt no answer [facility] rx ready for pick-up.../lmb 

## 2013-05-04 NOTE — Telephone Encounter (Signed)
Left msg on vm need refiil on her adderral 10 mg...lmb

## 2013-06-03 ENCOUNTER — Telehealth: Payer: Self-pay | Admitting: *Deleted

## 2013-06-03 MED ORDER — AMPHETAMINE-DEXTROAMPHETAMINE 10 MG PO TABS: 10.0000 mg | ORAL_TABLET | Freq: Two times a day (BID) | ORAL | Status: DC | PRN

## 2013-06-03 NOTE — Telephone Encounter (Signed)
Ok to refill 

## 2013-06-03 NOTE — Telephone Encounter (Signed)
Notified pt rx ready for pick.left/lmb

## 2013-06-03 NOTE — Telephone Encounter (Signed)
Received fax pt requesting refill on her adderal. MD out of office. Pls advise on refill...lmb

## 2013-06-07 ENCOUNTER — Ambulatory Visit: Payer: BC Managed Care – PPO

## 2013-06-13 ENCOUNTER — Ambulatory Visit: Payer: BC Managed Care – PPO

## 2013-06-16 ENCOUNTER — Other Ambulatory Visit: Payer: Self-pay

## 2013-07-04 ENCOUNTER — Other Ambulatory Visit: Payer: Self-pay | Admitting: Family Medicine

## 2013-07-04 MED ORDER — AMPHETAMINE-DEXTROAMPHETAMINE 10 MG PO TABS: 10.0000 mg | ORAL_TABLET | Freq: Two times a day (BID) | ORAL | Status: DC | PRN

## 2013-07-04 NOTE — Addendum Note (Signed)
Addended by: Rene Paci A on: 07/04/2013 05:58 PM   Modules accepted: Orders, Medications

## 2013-07-04 NOTE — Telephone Encounter (Signed)
rx done- on Lucy's desk

## 2013-07-04 NOTE — Telephone Encounter (Signed)
Pt needs hard copy RX.  Please call pt when ready.

## 2013-07-04 NOTE — Telephone Encounter (Signed)
rX printed and signed

## 2013-07-04 NOTE — Telephone Encounter (Signed)
This is actually Dr. Diamantina Monks pt.  Forwarded to PCP.

## 2013-07-05 NOTE — Telephone Encounter (Signed)
Notified pt rx ready for pick-up.../lmb 

## 2013-07-11 ENCOUNTER — Other Ambulatory Visit: Payer: Self-pay | Admitting: Internal Medicine

## 2013-08-08 ENCOUNTER — Other Ambulatory Visit: Payer: Self-pay | Admitting: *Deleted

## 2013-08-08 MED ORDER — AMPHETAMINE-DEXTROAMPHETAMINE 10 MG PO TABS: 10.0000 mg | ORAL_TABLET | Freq: Two times a day (BID) | ORAL | Status: DC | PRN

## 2013-08-08 NOTE — Telephone Encounter (Signed)
Called pt no answer LMOM (cell) rx ready for pick-up.../lmb 

## 2013-09-07 ENCOUNTER — Other Ambulatory Visit: Payer: Self-pay | Admitting: *Deleted

## 2013-09-07 MED ORDER — AMPHETAMINE-DEXTROAMPHETAMINE 10 MG PO TABS: 10.0000 mg | ORAL_TABLET | Freq: Two times a day (BID) | ORAL | Status: DC | PRN

## 2013-09-07 NOTE — Telephone Encounter (Signed)
Notified pt rx ready for pick-up.../lmb 

## 2013-11-09 ENCOUNTER — Other Ambulatory Visit: Payer: Self-pay | Admitting: *Deleted

## 2013-11-09 MED ORDER — AMPHETAMINE-DEXTROAMPHETAMINE 10 MG PO TABS: 10.0000 mg | ORAL_TABLET | Freq: Two times a day (BID) | ORAL | Status: DC | PRN

## 2013-11-09 NOTE — Telephone Encounter (Signed)
Requesting refill on her adderrall...[name]/lmb

## 2013-11-09 NOTE — Telephone Encounter (Signed)
Called pt no answer [facility] rx ready for pick-up.../lmb 

## 2013-12-13 ENCOUNTER — Other Ambulatory Visit: Payer: Self-pay | Admitting: *Deleted

## 2013-12-14 MED ORDER — AMPHETAMINE-DEXTROAMPHETAMINE 10 MG PO TABS: 10.0000 mg | ORAL_TABLET | Freq: Two times a day (BID) | ORAL | Status: DC | PRN

## 2013-12-14 NOTE — Telephone Encounter (Signed)
Patient wants to see if this can be done today. She is getting ready to go out of town.

## 2013-12-14 NOTE — Telephone Encounter (Signed)
Called left detailed message to pickup hardcopy at the front desk.

## 2013-12-15 ENCOUNTER — Other Ambulatory Visit: Payer: Self-pay | Admitting: Internal Medicine

## 2014-01-16 ENCOUNTER — Telehealth: Payer: Self-pay | Admitting: *Deleted

## 2014-01-16 MED ORDER — AMPHETAMINE-DEXTROAMPHETAMINE 10 MG PO TABS: 10.0000 mg | ORAL_TABLET | Freq: Two times a day (BID) | ORAL | Status: DC | PRN

## 2014-01-16 NOTE — Telephone Encounter (Signed)
Notified pt rx ready for pick-up.../lmb 

## 2014-01-16 NOTE — Telephone Encounter (Signed)
ok 

## 2014-01-16 NOTE — Telephone Encounter (Signed)
Pt called requesting Adderall refill.  Pt last refill 5.6.15.  Last OV 8.5.14.  Please advise

## 2014-01-23 ENCOUNTER — Other Ambulatory Visit: Payer: Self-pay | Admitting: Internal Medicine

## 2014-01-30 ENCOUNTER — Other Ambulatory Visit: Payer: Self-pay | Admitting: Internal Medicine

## 2014-02-15 ENCOUNTER — Other Ambulatory Visit: Payer: Self-pay | Admitting: *Deleted

## 2014-02-15 NOTE — Telephone Encounter (Signed)
Left msg on triage needing refill on her aderrall.Marland Kitchen.Raechel Chute/lmb

## 2014-02-15 NOTE — Telephone Encounter (Signed)
Received fax from pharm for Triamterene- HCTZ to be refilled.

## 2014-02-16 ENCOUNTER — Other Ambulatory Visit: Payer: Self-pay

## 2014-02-16 MED ORDER — TRIAMTERENE-HCTZ 37.5-25 MG PO TABS: 1.0000 | ORAL_TABLET | Freq: Every day | ORAL | Status: DC

## 2014-02-16 NOTE — Telephone Encounter (Signed)
Pt call bck requesting refill on her aderral.../lmb

## 2014-02-17 MED ORDER — AMPHETAMINE-DEXTROAMPHETAMINE 10 MG PO TABS: 10.0000 mg | ORAL_TABLET | Freq: Two times a day (BID) | ORAL | Status: DC | PRN

## 2014-02-17 NOTE — Telephone Encounter (Signed)
Called the patient informed to pickup hardcopy at the front desk. 

## 2014-02-17 NOTE — Telephone Encounter (Signed)
Done hardcopy to robin  

## 2014-02-17 NOTE — Telephone Encounter (Signed)
Pt called back requesting status on aderrall refill. MD is out of office is this ok to refill...Raechel Chute/lmb

## 2014-02-26 ENCOUNTER — Other Ambulatory Visit: Payer: Self-pay | Admitting: Internal Medicine

## 2014-03-15 ENCOUNTER — Other Ambulatory Visit: Payer: Self-pay

## 2014-03-15 MED ORDER — AMPHETAMINE-DEXTROAMPHETAMINE 10 MG PO TABS: 10.0000 mg | ORAL_TABLET | Freq: Two times a day (BID) | ORAL | Status: DC | PRN

## 2014-03-15 NOTE — Telephone Encounter (Signed)
Pt informed that rx is ready for pick up at the office.

## 2014-04-11 ENCOUNTER — Other Ambulatory Visit: Payer: Self-pay

## 2014-04-11 MED ORDER — ESTRADIOL 1 MG PO TABS: ORAL_TABLET | ORAL | Status: DC

## 2014-04-20 ENCOUNTER — Telehealth: Payer: Self-pay

## 2014-04-20 MED ORDER — AMPHETAMINE-DEXTROAMPHETAMINE 10 MG PO TABS: 10.0000 mg | ORAL_TABLET | Freq: Two times a day (BID) | ORAL | Status: DC | PRN

## 2014-04-20 NOTE — Telephone Encounter (Signed)
Printer rx request for PCP to sign.

## 2014-05-18 ENCOUNTER — Other Ambulatory Visit: Payer: Self-pay

## 2014-05-18 ENCOUNTER — Telehealth: Payer: Self-pay | Admitting: Internal Medicine

## 2014-05-18 MED ORDER — AMPHETAMINE-DEXTROAMPHETAMINE 10 MG PO TABS: 10.0000 mg | ORAL_TABLET | Freq: Two times a day (BID) | ORAL | Status: DC | PRN

## 2014-05-18 NOTE — Telephone Encounter (Signed)
Pt informed that rx is ready to pick up from front office.

## 2014-05-18 NOTE — Telephone Encounter (Signed)
Would like refill on adderall.  Patient will be out on the 9th.

## 2014-06-09 ENCOUNTER — Ambulatory Visit: Payer: BC Managed Care – PPO | Admitting: Internal Medicine

## 2014-06-16 ENCOUNTER — Ambulatory Visit (INDEPENDENT_AMBULATORY_CARE_PROVIDER_SITE_OTHER): Payer: BC Managed Care – PPO | Admitting: Geriatric Medicine

## 2014-06-16 ENCOUNTER — Encounter: Payer: Self-pay | Admitting: Internal Medicine

## 2014-06-16 ENCOUNTER — Ambulatory Visit (INDEPENDENT_AMBULATORY_CARE_PROVIDER_SITE_OTHER): Payer: BC Managed Care – PPO | Admitting: Internal Medicine

## 2014-06-16 VITALS — BP 116/62 | HR 81 | Temp 98.1°F | Resp 14 | Ht 67.0 in | Wt 149.0 lb

## 2014-06-16 DIAGNOSIS — F988 Other specified behavioral and emotional disorders with onset usually occurring in childhood and adolescence: Secondary | ICD-10-CM

## 2014-06-16 DIAGNOSIS — Z23 Encounter for immunization: Secondary | ICD-10-CM

## 2014-06-16 DIAGNOSIS — F909 Attention-deficit hyperactivity disorder, unspecified type: Secondary | ICD-10-CM

## 2014-06-16 MED ORDER — AMPHETAMINE-DEXTROAMPHETAMINE 10 MG PO TABS: 10.0000 mg | ORAL_TABLET | Freq: Two times a day (BID) | ORAL | Status: DC | PRN

## 2014-06-16 NOTE — Progress Notes (Signed)
Pre visit review using our clinic review tool, if applicable. No additional management support is needed unless otherwise documented below in the visit note. 

## 2014-06-16 NOTE — Patient Instructions (Signed)
We have refilled her Adderall today and given you the flu shot.   Please come back to see Dr. Felicity CoyerLeschber as scheduled.  Good luck with your hand.

## 2014-06-19 NOTE — Assessment & Plan Note (Signed)
Per patient doing well and helped by her Adderall. Rx given at today's visit.

## 2014-06-19 NOTE — Progress Notes (Signed)
   Subjective:    Patient ID: [name], female    DOB: 23-Aug-1955, 58 y.o.   MRN: [id number]  HPI The patient is a 58 year old female coming in today for medication follow-up although no medical changes were made at last visit. She thinks that she may be here as she has ADHD and gets Adderall and therefore needs visits every 3 months. She denies any problems changes. She feels that her Adderall is still helping with her concentration and states that she does not take it every day but only when she needs to focus at work.  Review of Systems  Constitutional: Negative for chills, activity change, fatigue and unexpected weight change.  HENT: Negative.   Respiratory: Negative for cough, chest tightness, shortness of breath and wheezing.   Cardiovascular: Negative for chest pain, palpitations and leg swelling.  Gastrointestinal: Negative for abdominal pain, diarrhea, constipation and abdominal distention.  Psychiatric/Behavioral: Negative.       Objective:   Physical Exam  Constitutional: She is oriented to person, place, and time. She appears well-developed and well-nourished. No distress.  HENT:  Head: Normocephalic and atraumatic.  Eyes: EOM are normal.  Neck: Normal range of motion.  Cardiovascular: Normal rate and regular rhythm.   Pulmonary/Chest: Effort normal and breath sounds normal.  Abdominal: Soft. Bowel sounds are normal.  Neurological: She is alert and oriented to person, place, and time. Coordination normal.  Skin: Skin is warm and dry.   Filed Vitals:   06/16/14 1620  BP: 116/62  Pulse: 81  Temp: 98.1 F (36.7 C)  TempSrc: Oral  Resp: 14  Height: 5\' 7"  (1.702 m)  Weight: 149 lb (67.586 kg)  SpO2: 95%      Assessment & Plan:

## 2014-07-15 ENCOUNTER — Other Ambulatory Visit: Payer: Self-pay | Admitting: Internal Medicine

## 2014-07-21 ENCOUNTER — Telehealth: Payer: Self-pay | Admitting: *Deleted

## 2014-07-21 MED ORDER — AMPHETAMINE-DEXTROAMPHETAMINE 10 MG PO TABS: 10.0000 mg | ORAL_TABLET | Freq: Two times a day (BID) | ORAL | Status: DC | PRN

## 2014-07-21 NOTE — Telephone Encounter (Signed)
Left msg on triage requesting refill on her adderal. MD out of office pls advise on refill...Raechel Chute/lmb

## 2014-07-21 NOTE — Telephone Encounter (Signed)
Done hardcopy to robin  

## 2014-07-21 NOTE — Telephone Encounter (Signed)
Called the patient left a detailed message hardcopy is ready for pickup at the front desk. 

## 2014-08-14 ENCOUNTER — Other Ambulatory Visit: Payer: Self-pay | Admitting: Internal Medicine

## 2014-08-18 ENCOUNTER — Ambulatory Visit (INDEPENDENT_AMBULATORY_CARE_PROVIDER_SITE_OTHER): Payer: BLUE CROSS/BLUE SHIELD | Admitting: Family

## 2014-08-18 ENCOUNTER — Encounter: Payer: Self-pay | Admitting: Family

## 2014-08-18 VITALS — BP 138/88 | HR 78 | Temp 98.4°F | Resp 18 | Ht 67.0 in | Wt 155.2 lb

## 2014-08-18 DIAGNOSIS — F909 Attention-deficit hyperactivity disorder, unspecified type: Secondary | ICD-10-CM

## 2014-08-18 DIAGNOSIS — J069 Acute upper respiratory infection, unspecified: Secondary | ICD-10-CM

## 2014-08-18 DIAGNOSIS — F988 Other specified behavioral and emotional disorders with onset usually occurring in childhood and adolescence: Secondary | ICD-10-CM

## 2014-08-18 MED ORDER — AMPHETAMINE-DEXTROAMPHETAMINE 10 MG PO TABS: 10.0000 mg | ORAL_TABLET | Freq: Two times a day (BID) | ORAL | Status: DC | PRN

## 2014-08-18 NOTE — Patient Instructions (Signed)
Thank you for choosing [facility].  Summary/Instructions:  Your prescription(s) have been submitted to your pharmacy or been printed and provided for you. Please take as directed and contact our office if you believe you are having problem(s) with the medication(s) or have any questions.  If your symptoms worsen or fail to improve, please contact our office for further instruction, or in case of emergency go directly to the emergency room at the closest medical facility.   General Recommendations:    Please drink plenty of fluids.  Get plenty of rest   Sleep in humidified air  Use saline nasal sprays  Netti pot   OTC Medications:  Decongestants - helps relieve congestion   Flonase (generic fluticasone) or Nasacort (generic triamcinolone) - please make sure to use the "cross-over" technique at a 45 degree angle towards the opposite eye as opposed to straight up the nasal passageway.   Sudafed (generic pseudoephedrine - Note this is the one that is available behind the pharmacy counter); Products with phenylephrine (-PE) may also be used but is often not as effective as pseudoephedrine.   If you have HIGH BLOOD PRESSURE - Coricidin HBP; AVOID any product that is -D as this contains pseudoephedrine which may increase your blood pressure.  Afrin (oxymetazoline) every 6-8 hours for up to 3 days.   Allergies - helps relieve runny nose, itchy eyes and sneezing   Claritin (generic loratidine), Allegra (fexofenidine), or Zyrtec (generic cyrterizine) for runny nose. These medications should not cause drowsiness.  Note - Benadryl (generic diphenhydramine) may be used however may cause drowsiness  Cough -   Delsym or Robitussin (generic dextromethorphan)  Expectorants - helps loosen mucus to ease removal   Mucinex (generic guaifenesin) as directed on the package.  Headaches / General Aches   Tylenol (generic acetaminophen) - DO NOT EXCEED 3 grams (3,000 mg) in a 24  hour time period  Advil/Motrin (generic ibuprofen)   Sore Throat -   Salt water gargle   Chloraseptic (generic benzocaine) spray or lozenges / Sucrets (generic dyclonine)        Upper Respiratory Infection, Adult An upper respiratory infection (URI) is also sometimes known as the common cold. The upper respiratory tract includes the nose, sinuses, throat, trachea, and bronchi. Bronchi are the airways leading to the lungs. Most people improve within 1 week, but symptoms can last up to 2 weeks. A residual cough may last even longer.  CAUSES Many different viruses can infect the tissues lining the upper respiratory tract. The tissues become irritated and inflamed and often become very moist. Mucus production is also common. A cold is contagious. You can easily spread the virus to others by oral contact. This includes kissing, sharing a glass, coughing, or sneezing. Touching your mouth or nose and then touching a surface, which is then touched by another person, can also spread the virus. SYMPTOMS  Symptoms typically develop 1 to 3 days after you come in contact with a cold virus. Symptoms vary from person to person. They may include: 4. Runny nose. 5. Sneezing. 6. Nasal congestion. 7. Sinus irritation. 8. Sore throat. 9. Loss of voice (laryngitis). 10. Cough. 11. Fatigue. 12. Muscle aches. 13. Loss of appetite. 14. Headache. 15. Low-grade fever. DIAGNOSIS  You might diagnose your own cold based on familiar symptoms, since most people get a cold 2 to 3 times a year. Your caregiver can confirm this based on your exam. Most importantly, your caregiver can check that your symptoms are not due to   another disease such as strep throat, sinusitis, pneumonia, asthma, or epiglottitis. Blood tests, throat tests, and X-rays are not necessary to diagnose a common cold, but they may sometimes be helpful in excluding other more serious diseases. Your caregiver will decide if any further tests are  required. RISKS AND COMPLICATIONS  You may be at risk for a more severe case of the common cold if you smoke cigarettes, have chronic heart disease (such as heart failure) or lung disease (such as asthma), or if you have a weakened immune system. The very young and very old are also at risk for more serious infections. Bacterial sinusitis, middle ear infections, and bacterial pneumonia can complicate the common cold. The common cold can worsen asthma and chronic obstructive pulmonary disease (COPD). Sometimes, these complications can require emergency medical care and may be life-threatening. PREVENTION  The best way to protect against getting a cold is to practice good hygiene. Avoid oral or hand contact with people with cold symptoms. Wash your hands often if contact occurs. There is no clear evidence that vitamin C, vitamin E, echinacea, or exercise reduces the chance of developing a cold. However, it is always recommended to get plenty of rest and practice good nutrition. TREATMENT  Treatment is directed at relieving symptoms. There is no cure. Antibiotics are not effective, because the infection is caused by a virus, not by bacteria. Treatment may include: 2. Increased fluid intake. Sports drinks offer valuable electrolytes, sugars, and fluids. 3. Breathing heated mist or steam (vaporizer or shower). 4. Eating chicken soup or other clear broths, and maintaining good nutrition. 5. Getting plenty of rest. 6. Using gargles or lozenges for comfort. 7. Controlling fevers with ibuprofen or acetaminophen as directed by your caregiver. 8. Increasing usage of your inhaler if you have asthma. Zinc gel and zinc lozenges, taken in the first 24 hours of the common cold, can shorten the duration and lessen the severity of symptoms. Pain medicines may help with fever, muscle aches, and throat pain. A variety of non-prescription medicines are available to treat congestion and runny nose. Your caregiver can make  recommendations and may suggest nasal or lung inhalers for other symptoms.  HOME CARE INSTRUCTIONS  2. Only take over-the-counter or prescription medicines for pain, discomfort, or fever as directed by your caregiver. 3. Use a warm mist humidifier or inhale steam from a shower to increase air moisture. This may keep secretions moist and make it easier to breathe. 4. Drink enough water and fluids to keep your urine clear or pale yellow. 5. Rest as needed. 6. Return to work when your temperature has returned to normal or as your caregiver advises. You may need to stay home longer to avoid infecting others. You can also use a face mask and careful hand washing to prevent spread of the virus. SEEK MEDICAL CARE IF:  3. After the first few days, you feel you are getting worse rather than better. 4. You need your caregiver's advice about medicines to control symptoms. 5. You develop chills, worsening shortness of breath, or brown or red sputum. These may be signs of pneumonia. 6. You develop yellow or brown nasal discharge or pain in the face, especially when you bend forward. These may be signs of sinusitis. 7. You develop a fever, swollen neck glands, pain with swallowing, or white areas in the back of your throat. These may be signs of strep throat. SEEK IMMEDIATE MEDICAL CARE IF:  3. You have a fever. 4. You develop severe or   persistent headache, ear pain, sinus pain, or chest pain. 5. You develop wheezing, a prolonged cough, cough up blood, or have a change in your usual mucus (if you have chronic lung disease). 6. You develop sore muscles or a stiff neck. Document Released: 01/21/2001 Document Revised: 10/20/2011 Document Reviewed: 11/02/2013 Exit[employer] Patient Information 2015 [employer]. This information is not intended to replace advice given to you by your health care provider. Make sure you discuss any questions you have with your health care provider.    

## 2014-08-18 NOTE — Assessment & Plan Note (Signed)
Stable with current regimen of Adderall. Denies any adverse effects. Continue current dosage. Refill Adderall.

## 2014-08-18 NOTE — Progress Notes (Signed)
Pre visit review using our clinic review tool, if applicable. No additional management support is needed unless otherwise documented below in the visit note. 

## 2014-08-18 NOTE — Progress Notes (Signed)
Subjective:    Patient ID: [name], female    DOB: May 08, 1956, 59 y.o.   MRN: [id number]  Chief Complaint  Patient presents with  . Generalized Body Aches    has body ache, feels like throat is starting to swell, feel pressure in ears, also feel feverish, since yesterday    HPI:  Julia BreezeYvonna L Dezeeuw is a 59 y.o. female who presents today for an acute visit.  1) Acute symptoms of body ache, pressure in the ears, and feeling feverish started yesterday. Has tried ibuprofen which has helped little.   2) ADD - Currently treated with adderall. States that she has taken more this month than she ordinarily would and it has helped her to focus in meetings. Denies any changes in appeitite or sleep. Denies any chest pain or heart palpitations.   Allergies  Allergen Reactions  . Nifedipine     REACTION: increased HR    Current Outpatient Prescriptions on File Prior to Visit  Medication Sig Dispense Refill  . amphetamine-dextroamphetamine (ADDERALL) 10 MG tablet Take 1-2 tablets (10-20 mg total) by mouth 2 (two) times daily as needed. 120 tablet 0  . estradiol (ESTRACE) 1 MG tablet TAKE 1 AND 1/2 TABLETS (1.5 MG TOTAL) BY MOUTH DAILY. 135 tablet 0  . loratadine (CLARITIN) 10 MG tablet Take 10 mg by mouth daily as needed.    . Melatonin 3 MG CAPS Take 1 capsule by mouth at bedtime as needed.    Marland Kitchen. omeprazole (PRILOSEC OTC) 20 MG tablet Take 20 mg by mouth daily as needed.      . triamterene-hydrochlorothiazide (MAXZIDE-25) 37.5-25 MG per tablet Take 1 tablet by mouth daily. 90 tablet 3  . valACYclovir (VALTREX) 1000 MG tablet Take 2 tablets (2,000 mg total) by mouth 2 (two) times daily as needed (for fever blister). 120 tablet 0  . verapamil (CALAN) 120 MG tablet Take 120 mg by mouth 2 (two) times daily.     No current facility-administered medications on file prior to visit.    Review of Systems  Constitutional: Positive for fever.  HENT: Positive for sinus pressure and sore throat.    Musculoskeletal: Positive for myalgias.       Positive for body aches      Objective:    BP 138/88 mmHg  Pulse 78  Temp(Src) 98.4 F (36.9 C) (Oral)  Resp 18  Ht 5\' 7"  (1.702 m)  Wt 155 lb 3.2 oz (70.398 kg)  BMI 24.30 kg/m2  SpO2 99% Nursing note and vital signs reviewed.  Physical Exam  Constitutional: She is oriented to person, place, and time. She appears well-developed and well-nourished. No distress.  HENT:  Right Ear: Hearing, tympanic membrane, external ear and ear canal normal.  Left Ear: Hearing, tympanic membrane, external ear and ear canal normal.  Nose: Nose normal. Right sinus exhibits no maxillary sinus tenderness and no frontal sinus tenderness. Left sinus exhibits no maxillary sinus tenderness and no frontal sinus tenderness.  Mouth/Throat: Uvula is midline, oropharynx is clear and moist and mucous membranes are normal.  Neck: Neck supple.  Cardiovascular: Normal rate, regular rhythm, normal heart sounds and intact distal pulses.   Pulmonary/Chest: Effort normal and breath sounds normal.  Lymphadenopathy:    She has no cervical adenopathy.  Neurological: She is alert and oriented to person, place, and time.  Skin: Skin is warm and dry.  Psychiatric: She has a normal mood and affect. Her behavior is normal. Judgment and thought content normal.  Assessment & Plan:

## 2014-08-18 NOTE — Assessment & Plan Note (Signed)
Exam consistent with acute upper respiratory infection. Continue over-the-counter medications as needed for symptom relief. Discussed proper use of antibiotics with patient and she was understanding. Follow up if symptoms worsen or fail to improve.

## 2014-08-21 ENCOUNTER — Encounter: Payer: Self-pay | Admitting: Nurse Practitioner

## 2014-08-21 ENCOUNTER — Ambulatory Visit (INDEPENDENT_AMBULATORY_CARE_PROVIDER_SITE_OTHER): Payer: BLUE CROSS/BLUE SHIELD | Admitting: Nurse Practitioner

## 2014-08-21 ENCOUNTER — Encounter: Payer: Self-pay | Admitting: *Deleted

## 2014-08-21 VITALS — BP 120/88 | HR 99 | Temp 98.3°F | Ht 67.0 in | Wt 154.0 lb

## 2014-08-21 DIAGNOSIS — J111 Influenza due to unidentified influenza virus with other respiratory manifestations: Secondary | ICD-10-CM

## 2014-08-21 MED ORDER — HYDROCODONE-HOMATROPINE 5-1.5 MG/5ML PO SYRP: 5.0000 mL | ORAL_SOLUTION | Freq: Every evening | ORAL | Status: DC | PRN

## 2014-08-21 NOTE — Progress Notes (Signed)
Pre visit review using our clinic review tool, if applicable. No additional management support is needed unless otherwise documented below in the visit note. 

## 2014-08-21 NOTE — Patient Instructions (Signed)
You likely have flu. The average duration is 5-10 days. Treatment is largely symptom management. For sinus congestion, start daily sinus rinses (neilmed Sinus Rinse) & 30 mg to 60 mg pseudoephedrine twice daily. For sore throat use benzocaine throat lozenges or spray. For aches & fever alternate tylenol & ibuprophen every 4-6 hours. For cough, you may use a spoonful of honey thinned with lemon juice or hot tea, or cough syrup at night.  Sip fluids every hour. Rest. If you are not feeling better in 1 week or develop fever or chest pain, call us for re-evaluation. Feel better!    Influenza A (H1N1) H1N1 formerly called "swine flu" is a new influenza virus causing sickness in people. The H1N1 virus is different from seasonal influenza viruses. However, the H1N1 symptoms are similar to seasonal influenza and it is spread from person to person. You may be at higher risk for serious problems if you have underlying serious medical conditions. The CDC and the [employer] are following reported cases around the world. CAUSES   The flu is thought to spread mainly person-to-person through coughing or sneezing of infected people.  A person may become infected by touching something with the virus on it and then touching their mouth or nose. SYMPTOMS   Fever.  Headache.  Tiredness.  Cough.  Sore throat.  Runny or stuffy nose.  Body aches.  Diarrhea and vomiting These symptoms are referred to as "flu-like symptoms." A lot of different illnesses, including the common cold, may have similar symptoms. DIAGNOSIS   There are tests that can tell if you have the H1N1 virus.  Confirmed cases of H1N1 will be reported to the state or local health department.  A doctor's exam may be needed to tell whether you have an infection that is a complication of the flu. HOME CARE INSTRUCTIONS   Stay informed. Visit the [facility] website for current recommendations. Visit [employer]. You may also  call 1-800-CDC-INFO ((709)595-8755).  Get help early if you develop any of the above symptoms.  If you are at high risk from complications of the flu, talk to your caregiver as soon as you develop flu-like symptoms. Those at higher risk for complications include:  People 65 years or older.  People with chronic medical conditions.  Pregnant women.  Young children.  Your caregiver may recommend antiviral medicine to help treat the flu.  If you get the flu, get plenty of rest, drink enough water and fluids to keep your urine clear or pale yellow, and avoid using alcohol or tobacco.  You may take over-the-counter medicine to relieve the symptoms of the flu if your caregiver approves. (Never give aspirin to children or teenagers who have flu-like symptoms, particularly fever). TREATMENT  If you do get sick, antiviral drugs are available. These drugs can make your illness milder and make you feel better faster. Treatment should start soon after illness starts. It is only effective if taken within the first day of becoming ill. Only your caregiver can prescribe antiviral medication.  PREVENTION   Cover your nose and mouth with a tissue or your arm when you cough or sneeze. Throw the tissue away.  Wash your hands often with soap and warm water, especially after you cough or sneeze. Alcohol-based cleaners are also effective against germs.  Avoid touching your eyes, nose or mouth. This is one way germs spread.  Try to avoid contact with sick people. Follow public health advice regarding school closures. Avoid crowds.  Stay home  if you get sick. Limit contact with others to keep from infecting them. People infected with the H1N1 virus may be able to infect others anywhere from 1 day before feeling sick to 5-7 days after getting flu symptoms.  An H1N1 vaccine is available to help protect against the virus. In addition to the H1N1 vaccine, you will need to be vaccinated for seasonal influenza.  The H1N1 and seasonal vaccines may be given on the same day. The CDC especially recommends the H1N1 vaccine for:  Pregnant women.  People who live with or care for children younger than 836 months of age.  Health care and emergency services personnel.  Persons between the ages of 336 months through 57 years of age.  People from ages 7425 through 6764 years who are at higher risk for H1N1 because of chronic health disorders or immune system problems. FACEMASKS In community and home settings, the use of facemasks and N95 respirators are not normally recommended. In certain circumstances, a facemask or N95 respirator may be used for persons at increased risk of severe illness from influenza. Your caregiver can give additional recommendations for facemask use. IN CHILDREN, EMERGENCY WARNING SIGNS THAT NEED URGENT MEDICAL CARE:  Fast breathing or trouble breathing.  Bluish skin color.  Not drinking enough fluids.  Not waking up or not interacting normally.  Being so fussy that the child does not want to be held.  Your child has an oral temperature above 102 F (38.9 C), not controlled by medicine.  Your baby is older than 3 months with a rectal temperature of 102 F (38.9 C) or higher.  Your baby is 553 months old or younger with a rectal temperature of 100.4 F (38 C) or higher.  Flu-like symptoms improve but then return with fever and worse cough. IN ADULTS, EMERGENCY WARNING SIGNS THAT NEED URGENT MEDICAL CARE:  Difficulty breathing or shortness of breath.  Pain or pressure in the chest or abdomen.  Sudden dizziness.  Confusion.  Severe or persistent vomiting.  Bluish color.  You have a oral temperature above 102 F (38.9 C), not controlled by medicine.  Flu-like symptoms improve but return with fever and worse cough. SEEK IMMEDIATE MEDICAL CARE IF:  You or someone you know is experiencing any of the above symptoms. When you arrive at the emergency center, report that you  think you have the flu. You may be asked to wear a mask and/or sit in a secluded area to protect others from getting sick. MAKE SURE YOU:   Understand these instructions.  Will watch your condition.  Will get help right away if you are not doing well or get worse. Some of this information courtesy of the CDC.  Document Released: 01/14/2008 Document Revised: 10/20/2011 Document Reviewed: 01/14/2008 [facility] Patient Information 2014 [employer] [place].

## 2014-08-21 NOTE — Progress Notes (Signed)
   Subjective:    Patient ID: [name], female    DOB: 02-May-1956, 59 y.o.   MRN: [id number]  Influenza This is a new problem. The current episode started in the past 7 days (3d). The problem has been gradually improving. Associated symptoms include abdominal pain, chest pain, chills, congestion, coughing, fatigue, a fever, headaches and myalgias. Pertinent negatives include no nausea, sore throat, vomiting or weakness. Nothing aggravates the symptoms. Treatments tried: OTC benadryl. The treatment provided no relief.      Review of Systems  Constitutional: Positive for fever, chills and fatigue.  HENT: Positive for congestion. Negative for sore throat.   Respiratory: Positive for cough.   Cardiovascular: Positive for chest pain.  Gastrointestinal: Positive for abdominal pain. Negative for nausea and vomiting.  Musculoskeletal: Positive for myalgias.  Neurological: Positive for headaches. Negative for weakness.       Objective:   Physical Exam  Constitutional: She is oriented to person, place, and time. She appears well-developed and well-nourished. No distress.  Looks fatigued   HENT:  Head: Normocephalic and atraumatic.  Right Ear: External ear normal.  Left Ear: External ear normal.  Mouth/Throat: Oropharynx is clear and moist. No oropharyngeal exudate.  Eyes: Conjunctivae are normal. Right eye exhibits no discharge. Left eye exhibits no discharge.  Neck: Normal range of motion. Neck supple. No thyromegaly present.  Cardiovascular: Normal rate and regular rhythm.   No murmur heard. Pulmonary/Chest: Effort normal and breath sounds normal. No respiratory distress. She has no wheezes. She has no rales.  Lymphadenopathy:    She has no cervical adenopathy.  Neurological: She is alert and oriented to person, place, and time.  Skin: Skin is warm and dry.  Psychiatric: She has a normal mood and affect. Her behavior is normal. Thought content normal.  Vitals  reviewed.         Assessment & Plan:  1. Influenza Symptom management See pt instructions. - HYDROcodone-homatropine (HYCODAN) 5-1.5 MG/5ML syrup; Take 5 mLs by mouth at bedtime as needed for cough.  Dispense: 120 mL; Refill: 0 F/u PRN

## 2014-09-22 ENCOUNTER — Telehealth: Payer: Self-pay | Admitting: Internal Medicine

## 2014-09-22 ENCOUNTER — Other Ambulatory Visit: Payer: Self-pay | Admitting: *Deleted

## 2014-09-22 DIAGNOSIS — F988 Other specified behavioral and emotional disorders with onset usually occurring in childhood and adolescence: Secondary | ICD-10-CM

## 2014-09-22 MED ORDER — AMPHETAMINE-DEXTROAMPHETAMINE 10 MG PO TABS: 10.0000 mg | ORAL_TABLET | Freq: Two times a day (BID) | ORAL | Status: DC | PRN

## 2014-09-22 NOTE — Telephone Encounter (Signed)
Pt called requesting status on adderral script. Per chart Judeth CornfieldStephanie printed script 7 waiting on md to return on Monday pt is not satisfied wanting to get rx today. Dr. Alwyn RenHopper ok to sign script re-printed md sign. Dustin inform ready fro pick-up...Raechel Chute/lmb

## 2014-09-22 NOTE — Telephone Encounter (Signed)
Patient called requesting rx for adderall 10mg . Call [phone] when ready for pick up.

## 2014-09-22 NOTE — Telephone Encounter (Signed)
Printed for MD to sign on Monday.

## 2014-09-25 NOTE — Telephone Encounter (Signed)
Pt informed rx is ready for pick up

## 2014-10-20 ENCOUNTER — Telehealth: Payer: Self-pay | Admitting: Internal Medicine

## 2014-10-20 DIAGNOSIS — F988 Other specified behavioral and emotional disorders with onset usually occurring in childhood and adolescence: Secondary | ICD-10-CM

## 2014-10-20 MED ORDER — AMPHETAMINE-DEXTROAMPHETAMINE 10 MG PO TABS: 10.0000 mg | ORAL_TABLET | Freq: Two times a day (BID) | ORAL | Status: DC | PRN

## 2014-10-20 NOTE — Telephone Encounter (Signed)
Pt called in requesting refill on her amphetamine-dextroamphetamine (ADDERALL) 10 MG tablet [[id number]][[id number]]

## 2014-10-20 NOTE — Telephone Encounter (Signed)
rx printed. md will sign on Monday.

## 2014-11-28 ENCOUNTER — Telehealth: Payer: Self-pay | Admitting: Internal Medicine

## 2014-11-28 DIAGNOSIS — F988 Other specified behavioral and emotional disorders with onset usually occurring in childhood and adolescence: Secondary | ICD-10-CM

## 2014-11-28 MED ORDER — AMPHETAMINE-DEXTROAMPHETAMINE 10 MG PO TABS: 10.0000 mg | ORAL_TABLET | Freq: Two times a day (BID) | ORAL | Status: DC | PRN

## 2014-11-28 NOTE — Telephone Encounter (Signed)
Patient needs refill for amphetamine-dextroamphetamine (ADDERALL) 10 MG tablet [[id number]][[id number]]. She has an appt on 05/02 to establish primary care with Dr. Dorise HissKollar.

## 2014-12-08 ENCOUNTER — Ambulatory Visit: Payer: BLUE CROSS/BLUE SHIELD | Admitting: Internal Medicine

## 2014-12-11 ENCOUNTER — Encounter: Payer: Self-pay | Admitting: Internal Medicine

## 2014-12-11 ENCOUNTER — Ambulatory Visit (INDEPENDENT_AMBULATORY_CARE_PROVIDER_SITE_OTHER): Payer: BLUE CROSS/BLUE SHIELD | Admitting: Internal Medicine

## 2014-12-11 VITALS — BP 132/78 | HR 85 | Temp 98.5°F | Resp 12 | Ht 66.0 in | Wt 155.4 lb

## 2014-12-11 DIAGNOSIS — Z0000 Encounter for general adult medical examination without abnormal findings: Secondary | ICD-10-CM

## 2014-12-11 DIAGNOSIS — I10 Essential (primary) hypertension: Secondary | ICD-10-CM

## 2014-12-11 DIAGNOSIS — R911 Solitary pulmonary nodule: Secondary | ICD-10-CM

## 2014-12-11 DIAGNOSIS — F988 Other specified behavioral and emotional disorders with onset usually occurring in childhood and adolescence: Secondary | ICD-10-CM

## 2014-12-11 NOTE — Progress Notes (Signed)
Pre visit review using our clinic review tool, if applicable. No additional management support is needed unless otherwise documented below in the visit note. 

## 2014-12-11 NOTE — Patient Instructions (Signed)
We will check on the labs today and call you back with the results.   I would be happy to take you on as a patient if you want to switch from Dr. Asa Lente but I do not prescribe the adderall chronically and would end up sending you to an attention specialist who would be able to handle that. The other option would be to stay on with Dr. Asa Lente and see her when she is available but know that likely you would end up seeing another doctor at the office when you were sick.   Health Maintenance Adopting a healthy lifestyle and getting preventive care can go a long way to promote health and wellness. Talk with your health care provider about what schedule of regular examinations is right for you. This is a good chance for you to check in with your provider about disease prevention and staying healthy. In between checkups, there are plenty of things you can do on your own. Experts have done a lot of research about which lifestyle changes and preventive measures are most likely to keep you healthy. Ask your health care provider for more information. WEIGHT AND DIET  Eat a healthy diet  Be sure to include plenty of vegetables, fruits, low-fat dairy products, and lean protein.  Do not eat a lot of foods high in solid fats, added sugars, or salt.  Get regular exercise. This is one of the most important things you can do for your health.  Most adults should exercise for at least 150 minutes each week. The exercise should increase your heart rate and make you sweat (moderate-intensity exercise).  Most adults should also do strengthening exercises at least twice a week. This is in addition to the moderate-intensity exercise.  Maintain a healthy weight  Body mass index (BMI) is a measurement that can be used to identify possible weight problems. It estimates body fat based on height and weight. Your health care provider can help determine your BMI and help you achieve or maintain a healthy weight.  For  females 22 years of age and older:   A BMI below 18.5 is considered underweight.  A BMI of 18.5 to 24.9 is normal.  A BMI of 25 to 29.9 is considered overweight.  A BMI of 30 and above is considered obese.  Watch levels of cholesterol and blood lipids  You should start having your blood tested for lipids and cholesterol at 57 years of age, then have this test every 5 years.  You may need to have your cholesterol levels checked more often if:  Your lipid or cholesterol levels are high.  You are older than 57 years of age.  You are at high risk for heart disease.  CANCER SCREENING   Lung Cancer  Lung cancer screening is recommended for adults 93-28 years old who are at high risk for lung cancer because of a history of smoking.  A yearly low-dose CT scan of the lungs is recommended for people who:  Currently smoke.  Have quit within the past 15 years.  Have at least a 30-pack-year history of smoking. A pack year is smoking an average of one pack of cigarettes a day for 1 year.  Yearly screening should continue until it has been 15 years since you quit.  Yearly screening should stop if you develop a health problem that would prevent you from having lung cancer treatment.  Breast Cancer  Practice breast self-awareness. This means understanding how your breasts normally appear and  feel.  It also means doing regular breast self-exams. Let your health care provider know about any changes, no matter how small.  If you are in your 20s or 30s, you should have a clinical breast exam (CBE) by a health care provider every 1-3 years as part of a regular health exam.  If you are 79 or older, have a CBE every year. Also consider having a breast X-ray (mammogram) every year.  If you have a family history of breast cancer, talk to your health care provider about genetic screening.  If you are at high risk for breast cancer, talk to your health care provider about having an MRI and  a mammogram every year.  Breast cancer gene (BRCA) assessment is recommended for women who have family members with BRCA-related cancers. BRCA-related cancers include:  Breast.  Ovarian.  Tubal.  Peritoneal cancers.  Results of the assessment will determine the need for genetic counseling and BRCA1 and BRCA2 testing. Cervical Cancer Routine pelvic examinations to screen for cervical cancer are no longer recommended for nonpregnant women who are considered low risk for cancer of the pelvic organs (ovaries, uterus, and vagina) and who do not have symptoms. A pelvic examination may be necessary if you have symptoms including those associated with pelvic infections. Ask your health care provider if a screening pelvic exam is right for you.   The Pap test is the screening test for cervical cancer for women who are considered at risk.  If you had a hysterectomy for a problem that was not cancer or a condition that could lead to cancer, then you no longer need Pap tests.  If you are older than 65 years, and you have had normal Pap tests for the past 10 years, you no longer need to have Pap tests.  If you have had past treatment for cervical cancer or a condition that could lead to cancer, you need Pap tests and screening for cancer for at least 20 years after your treatment.  If you no longer get a Pap test, assess your risk factors if they change (such as having a new sexual partner). This can affect whether you should start being screened again.  Some women have medical problems that increase their chance of getting cervical cancer. If this is the case for you, your health care provider may recommend more frequent screening and Pap tests.  The human papillomavirus (HPV) test is another test that may be used for cervical cancer screening. The HPV test looks for the virus that can cause cell changes in the cervix. The cells collected during the Pap test can be tested for HPV.  The HPV test can  be used to screen women 50 years of age and older. Getting tested for HPV can extend the interval between normal Pap tests from three to five years.  An HPV test also should be used to screen women of any age who have unclear Pap test results.  After 57 years of age, women should have HPV testing as often as Pap tests.  Colorectal Cancer  This type of cancer can be detected and often prevented.  Routine colorectal cancer screening usually begins at 57 years of age and continues through 57 years of age.  Your health care provider may recommend screening at an earlier age if you have risk factors for colon cancer.  Your health care provider may also recommend using home test kits to check for hidden blood in the stool.  A small camera  at the end of a tube can be used to examine your colon directly (sigmoidoscopy or colonoscopy). This is done to check for the earliest forms of colorectal cancer.  Routine screening usually begins at age 34.  Direct examination of the colon should be repeated every 5-10 years through 57 years of age. However, you may need to be screened more often if early forms of precancerous polyps or small growths are found. Skin Cancer  Check your skin from head to toe regularly.  Tell your health care provider about any new moles or changes in moles, especially if there is a change in a mole's shape or color.  Also tell your health care provider if you have a mole that is larger than the size of a pencil eraser.  Always use sunscreen. Apply sunscreen liberally and repeatedly throughout the day.  Protect yourself by wearing long sleeves, pants, a wide-brimmed hat, and sunglasses whenever you are outside. HEART DISEASE, DIABETES, AND HIGH BLOOD PRESSURE   Have your blood pressure checked at least every 1-2 years. High blood pressure causes heart disease and increases the risk of stroke.  If you are between 71 years and 56 years old, ask your health care provider if  you should take aspirin to prevent strokes.  Have regular diabetes screenings. This involves taking a blood sample to check your fasting blood sugar level.  If you are at a normal weight and have a low risk for diabetes, have this test once every three years after 57 years of age.  If you are overweight and have a high risk for diabetes, consider being tested at a younger age or more often. PREVENTING INFECTION  Hepatitis B  If you have a higher risk for hepatitis B, you should be screened for this virus. You are considered at high risk for hepatitis B if:  You were born in a country where hepatitis B is common. Ask your health care provider which countries are considered high risk.  Your parents were born in a high-risk country, and you have not been immunized against hepatitis B (hepatitis B vaccine).  You have HIV or AIDS.  You use needles to inject street drugs.  You live with someone who has hepatitis B.  You have had sex with someone who has hepatitis B.  You get hemodialysis treatment.  You take certain medicines for conditions, including cancer, organ transplantation, and autoimmune conditions. Hepatitis C  Blood testing is recommended for:  Everyone born from [birth year] through 1963.  Anyone with known risk factors for hepatitis C. Sexually transmitted infections (STIs)  You should be screened for sexually transmitted infections (STIs) including gonorrhea and chlamydia if:  You are sexually active and are younger than 57 years of age.  You are older than 57 years of age and your health care provider tells you that you are at risk for this type of infection.  Your sexual activity has changed since you were last screened and you are at an increased risk for chlamydia or gonorrhea. Ask your health care provider if you are at risk.  If you do not have HIV, but are at risk, it may be recommended that you take a prescription medicine daily to prevent HIV infection. This is  called pre-exposure prophylaxis (PrEP). You are considered at risk if:  You are sexually active and do not regularly use condoms or know the HIV status of your partner(s).  You take drugs by injection.  You are sexually active with a partner  who has HIV. Talk with your health care provider about whether you are at high risk of being infected with HIV. If you choose to begin PrEP, you should first be tested for HIV. You should then be tested every 3 months for as long as you are taking PrEP.  PREGNANCY   If you are premenopausal and you may become pregnant, ask your health care provider about preconception counseling.  If you may become pregnant, take 400 to 800 micrograms (mcg) of folic acid every day.  If you want to prevent pregnancy, talk to your health care provider about birth control (contraception). OSTEOPOROSIS AND MENOPAUSE   Osteoporosis is a disease in which the bones lose minerals and strength with aging. This can result in serious bone fractures. Your risk for osteoporosis can be identified using a bone density scan.  If you are 18 years of age or older, or if you are at risk for osteoporosis and fractures, ask your health care provider if you should be screened.  Ask your health care provider whether you should take a calcium or vitamin D supplement to lower your risk for osteoporosis.  Menopause may have certain physical symptoms and risks.  Hormone replacement therapy may reduce some of these symptoms and risks. Talk to your health care provider about whether hormone replacement therapy is right for you.  HOME CARE INSTRUCTIONS   Schedule regular health, dental, and eye exams.  Stay current with your immunizations.   Do not use any tobacco products including cigarettes, chewing tobacco, or electronic cigarettes.  If you are pregnant, do not drink alcohol.  If you are breastfeeding, limit how much and how often you drink alcohol.  Limit alcohol intake to no more  than 1 drink per day for nonpregnant women. One drink equals 12 ounces of beer, 5 ounces of wine, or 1 ounces of hard liquor.  Do not use street drugs.  Do not share needles.  Ask your health care provider for help if you need support or information about quitting drugs.  Tell your health care provider if you often feel depressed.  Tell your health care provider if you have ever been abused or do not feel safe at home. Document Released: 02/10/2011 Document Revised: 12/12/2013 Document Reviewed: 06/29/2013 [facility] Patient Information 2015 [place], [place]. This information is not intended to replace advice given to you by your health care provider. Make sure you discuss any questions you have with your health care provider.

## 2014-12-12 DIAGNOSIS — Z0000 Encounter for general adult medical examination without abnormal findings: Secondary | ICD-10-CM | POA: Insufficient documentation

## 2014-12-12 NOTE — Progress Notes (Signed)
   Subjective:    Patient ID: [name], female    DOB: [birth year], 59 y.o.   MRN: [id number]  HPI The patient is a 59 YO female who is coming in for wellness. She has no new complaints. Please see A/P for status of chronic medical problems.   [facility], [facility], social history reviewed with the patient.   Review of Systems  Constitutional: Negative for chills, activity change, fatigue and unexpected weight change.  HENT: Negative.   Eyes: Negative.   Respiratory: Negative for cough, chest tightness, shortness of breath and wheezing.   Cardiovascular: Negative for chest pain, palpitations and leg swelling.  Gastrointestinal: Negative for abdominal pain, diarrhea, constipation and abdominal distention.  Musculoskeletal: Negative.   Skin: Negative.   Neurological: Negative.   Psychiatric/Behavioral: Negative.       Objective:   Physical Exam  Constitutional: She is oriented to person, place, and time. She appears well-developed and well-nourished. No distress.  HENT:  Head: Normocephalic and atraumatic.  Eyes: EOM are normal.  Neck: Normal range of motion.  Cardiovascular: Normal rate and regular rhythm.   Pulmonary/Chest: Effort normal and breath sounds normal.  Abdominal: Soft. Bowel sounds are normal.  Neurological: She is alert and oriented to person, place, and time. Coordination normal.  Skin: Skin is warm and dry.   Filed Vitals:   12/11/14 1605  BP: 132/78  Pulse: 85  Temp: 98.5 F (36.9 C)  TempSrc: Oral  Resp: 12  Height: 5\' 6"  (1.676 m)  Weight: 155 lb 6.4 oz (70.489 kg)  SpO2: 96%      Assessment & Plan:

## 2014-12-12 NOTE — Assessment & Plan Note (Signed)
Advised her that I do not handle ADD chronically and if she switches to me I would refer her to an ADHD specialist for her treatment.

## 2014-12-12 NOTE — Assessment & Plan Note (Signed)
Needs follow up as it has been 1.5 years. If no new nodules or growth can be last scan.

## 2014-12-12 NOTE — Assessment & Plan Note (Signed)
Reminded that she is behind on mammogram. No LFT changes to need Hep C screening, does not want HIV screening today. Checking labs today and talked with her about smoking dangers (she is non-smoker). Talked with her as well about sun safety and protective clothing and sunscreen when outside for extended durations of time.

## 2014-12-12 NOTE — Assessment & Plan Note (Signed)
BP controlled on maxzide and verapamil. Checking labs and adjust as needed.

## 2014-12-14 ENCOUNTER — Other Ambulatory Visit (INDEPENDENT_AMBULATORY_CARE_PROVIDER_SITE_OTHER): Payer: BLUE CROSS/BLUE SHIELD

## 2014-12-14 DIAGNOSIS — Z0000 Encounter for general adult medical examination without abnormal findings: Secondary | ICD-10-CM | POA: Diagnosis not present

## 2014-12-14 LAB — COMPREHENSIVE METABOLIC PANEL
ALK PHOS: 62 U/L (ref 39–117)
ALT: 21 U/L (ref 0–35)
AST: 24 U/L (ref 0–37)
Albumin: 4.1 g/dL (ref 3.5–5.2)
BILIRUBIN TOTAL: 0.4 mg/dL (ref 0.2–1.2)
BUN: 10 mg/dL (ref 6–23)
CALCIUM: 9.4 mg/dL (ref 8.4–10.5)
CO2: 31 mEq/L (ref 19–32)
CREATININE: 0.75 mg/dL (ref 0.40–1.20)
Chloride: 101 mEq/L (ref 96–112)
GFR: 84.67 mL/min (ref >60.00)
Glucose, Bld: 86 mg/dL (ref 70–99)
Potassium: 3.7 mEq/L (ref 3.5–5.1)
Sodium: 139 mEq/L (ref 135–145)
Total Protein: 7.1 g/dL (ref 6.0–8.3)

## 2014-12-14 LAB — LIPID PANEL
CHOL/HDL RATIO: 2
Cholesterol: 180 mg/dL (ref 0–200)
HDL: 86.4 mg/dL (ref >39.00)
LDL CALC: 82 mg/dL (ref 0–99)
NonHDL: 93.6
TRIGLYCERIDES: 59 mg/dL (ref 0.0–149.0)
VLDL: 11.8 mg/dL (ref 0.0–40.0)

## 2014-12-14 LAB — CBC
HCT: 37.1 % (ref 36.0–46.0)
HEMOGLOBIN: 12.4 g/dL (ref 12.0–15.0)
MCHC: 33.5 g/dL (ref 30.0–36.0)
MCV: 86.1 fl (ref 78.0–100.0)
PLATELETS: 258 10*3/uL (ref 150.0–400.0)
RBC: 4.31 Mil/uL (ref 3.87–5.11)
RDW: 16 % — ABNORMAL HIGH (ref 11.5–15.5)
WBC: 3.3 10*3/uL — AB (ref 4.0–10.5)

## 2014-12-14 LAB — TSH: TSH: 1.56 u[IU]/mL (ref 0.35–4.50)

## 2014-12-26 ENCOUNTER — Inpatient Hospital Stay: Admission: RE | Admit: 2014-12-26 | Payer: BLUE CROSS/BLUE SHIELD | Source: Ambulatory Visit

## 2014-12-28 ENCOUNTER — Other Ambulatory Visit: Payer: Self-pay

## 2014-12-28 ENCOUNTER — Telehealth: Payer: Self-pay | Admitting: Internal Medicine

## 2014-12-28 DIAGNOSIS — F988 Other specified behavioral and emotional disorders with onset usually occurring in childhood and adolescence: Secondary | ICD-10-CM

## 2014-12-28 NOTE — Telephone Encounter (Signed)
Patient is requesting refill amphetamine-dextroamphetamine (ADDERALL) 10 MG tablet [[id number]][[id number]]. She's going out of town and is hoping to pick up today.

## 2014-12-29 MED ORDER — AMPHETAMINE-DEXTROAMPHETAMINE 10 MG PO TABS: 10.0000 mg | ORAL_TABLET | Freq: Two times a day (BID) | ORAL | Status: DC | PRN

## 2014-12-29 NOTE — Telephone Encounter (Signed)
Filled

## 2014-12-29 NOTE — Telephone Encounter (Signed)
Pt called back in for this refill

## 2014-12-29 NOTE — Addendum Note (Signed)
Addended by: Genella MechKOLLAR, Giannina Bartolome A on: 12/29/2014 04:41 PM   Modules accepted: Orders

## 2014-12-29 NOTE — Telephone Encounter (Signed)
Amy is Dr Dorise Hisskollar able to refill this med for this pt today?

## 2015-01-04 ENCOUNTER — Inpatient Hospital Stay: Admission: RE | Admit: 2015-01-04 | Payer: BLUE CROSS/BLUE SHIELD | Source: Ambulatory Visit

## 2015-01-10 ENCOUNTER — Telehealth: Payer: Self-pay | Admitting: Geriatric Medicine

## 2015-01-10 NOTE — Telephone Encounter (Signed)
Left message asking patient to call me back to let me know if she has had a mammogram recently.  

## 2015-01-11 ENCOUNTER — Ambulatory Visit (INDEPENDENT_AMBULATORY_CARE_PROVIDER_SITE_OTHER)
Admission: RE | Admit: 2015-01-11 | Discharge: 2015-01-11 | Disposition: A | Discharge status: Home / Self Care | Payer: BLUE CROSS/BLUE SHIELD | Source: Ambulatory Visit | Attending: Internal Medicine | Admitting: Internal Medicine

## 2015-01-11 DIAGNOSIS — R911 Solitary pulmonary nodule: Secondary | ICD-10-CM

## 2015-01-26 ENCOUNTER — Telehealth: Payer: Self-pay | Admitting: Internal Medicine

## 2015-01-26 DIAGNOSIS — F988 Other specified behavioral and emotional disorders with onset usually occurring in childhood and adolescence: Secondary | ICD-10-CM

## 2015-01-26 MED ORDER — AMPHETAMINE-DEXTROAMPHETAMINE 10 MG PO TABS: 10.0000 mg | ORAL_TABLET | Freq: Two times a day (BID) | ORAL | Status: DC | PRN

## 2015-01-26 NOTE — Telephone Encounter (Signed)
Do not treat ADD and told her at last visit if she switches PCP to me she would need to see ADD specialist. Filled last month for her in extraordinary circumstance but cannot refill.

## 2015-01-26 NOTE — Telephone Encounter (Signed)
Pt stated that she wants to keep Dr. Felicity Coyer as PCP.  Can this be approved in Dr. Diamantina Monks absence.

## 2015-01-26 NOTE — Addendum Note (Signed)
Addended by: KOLLAR, Kaseem Vastine A on: 01/26/2015 05:10 PM   Modules accepted: Orders  

## 2015-01-26 NOTE — Telephone Encounter (Signed)
Patient is needing a refill for amphetamine-dextroamphetamine (ADDERALL) 10 MG tablet [[id number]] .

## 2015-01-27 NOTE — Telephone Encounter (Signed)
Pt informed that rx is ready for pick-up.

## 2015-01-29 ENCOUNTER — Other Ambulatory Visit: Payer: Self-pay | Admitting: Internal Medicine

## 2015-02-23 ENCOUNTER — Other Ambulatory Visit: Payer: Self-pay | Admitting: Internal Medicine

## 2015-02-26 ENCOUNTER — Telehealth: Payer: Self-pay | Admitting: Internal Medicine

## 2015-02-26 ENCOUNTER — Other Ambulatory Visit: Payer: Self-pay

## 2015-02-26 DIAGNOSIS — F988 Other specified behavioral and emotional disorders with onset usually occurring in childhood and adolescence: Secondary | ICD-10-CM

## 2015-02-26 MED ORDER — AMPHETAMINE-DEXTROAMPHETAMINE 10 MG PO TABS: 10.0000 mg | ORAL_TABLET | Freq: Two times a day (BID) | ORAL | Status: DC | PRN

## 2015-02-26 NOTE — Telephone Encounter (Signed)
Pt called in needs refill on her amphetamine-dextroamphetamine (ADDERALL) 10 MG tablet [[id number]][[id number]]

## 2015-02-26 NOTE — Telephone Encounter (Signed)
RX printed for md to sign.

## 2015-03-25 ENCOUNTER — Other Ambulatory Visit: Payer: Self-pay | Admitting: Internal Medicine

## 2015-03-27 ENCOUNTER — Other Ambulatory Visit: Payer: Self-pay

## 2015-03-27 ENCOUNTER — Telehealth: Payer: Self-pay | Admitting: Internal Medicine

## 2015-03-27 DIAGNOSIS — F988 Other specified behavioral and emotional disorders with onset usually occurring in childhood and adolescence: Secondary | ICD-10-CM

## 2015-03-27 MED ORDER — AMPHETAMINE-DEXTROAMPHETAMINE 10 MG PO TABS: 10.0000 mg | ORAL_TABLET | Freq: Two times a day (BID) | ORAL | Status: DC | PRN

## 2015-03-27 NOTE — Telephone Encounter (Signed)
Patient is requesting a refill of   amphetamine-dextroamphetamine (ADDERALL) 10 MG tablet [[id number]]

## 2015-03-28 NOTE — Telephone Encounter (Signed)
Notified pt rx ready for pick-up.../lmb 

## 2015-04-30 ENCOUNTER — Telehealth: Payer: Self-pay | Admitting: *Deleted

## 2015-04-30 DIAGNOSIS — F988 Other specified behavioral and emotional disorders with onset usually occurring in childhood and adolescence: Secondary | ICD-10-CM

## 2015-04-30 NOTE — Telephone Encounter (Signed)
Receive call pt requesting refill on her Adderral.../lmb 

## 2015-05-01 MED ORDER — AMPHETAMINE-DEXTROAMPHETAMINE 10 MG PO TABS: 10.0000 mg | ORAL_TABLET | Freq: Two times a day (BID) | ORAL | Status: DC | PRN

## 2015-05-01 NOTE — Telephone Encounter (Signed)
Printed and signed. Needs to schedule visit with PCP as no future visits exist.

## 2015-05-01 NOTE — Telephone Encounter (Signed)
Notified pt rx ready for pick-up.../LMB 

## 2015-05-01 NOTE — Telephone Encounter (Signed)
MD is out of office is this ok to refill...Raechel Chute

## 2015-05-29 ENCOUNTER — Other Ambulatory Visit: Payer: Self-pay

## 2015-05-29 ENCOUNTER — Telehealth: Payer: Self-pay | Admitting: Internal Medicine

## 2015-05-29 DIAGNOSIS — F988 Other specified behavioral and emotional disorders with onset usually occurring in childhood and adolescence: Secondary | ICD-10-CM

## 2015-05-29 MED ORDER — AMPHETAMINE-DEXTROAMPHETAMINE 10 MG PO TABS: 10.0000 mg | ORAL_TABLET | Freq: Two times a day (BID) | ORAL | Status: DC | PRN

## 2015-05-29 NOTE — Telephone Encounter (Signed)
Pt called request refill for amphetamine-dextroamphetamine (ADDERALL) 10 MG tablet. Please call pt once its ready.

## 2015-05-30 NOTE — Telephone Encounter (Signed)
Patient is requesting call in regards to getting medication.  Would like a call in regards.

## 2015-05-31 NOTE — Telephone Encounter (Signed)
[facility]

## 2015-05-31 NOTE — Telephone Encounter (Signed)
Pt came and pick up rx.

## 2015-06-28 ENCOUNTER — Telehealth: Payer: Self-pay | Admitting: *Deleted

## 2015-06-28 DIAGNOSIS — F988 Other specified behavioral and emotional disorders with onset usually occurring in childhood and adolescence: Secondary | ICD-10-CM

## 2015-06-28 NOTE — Telephone Encounter (Signed)
Receive call pt is requesting a refill on her adderral. MD is out of office pls advise...Raechel Chute/lmb

## 2015-06-29 MED ORDER — AMPHETAMINE-DEXTROAMPHETAMINE 10 MG PO TABS: 10.0000 mg | ORAL_TABLET | Freq: Two times a day (BID) | ORAL | Status: DC | PRN

## 2015-06-29 NOTE — Telephone Encounter (Signed)
Pt informed, Rx in cabinet for pt pick up  

## 2015-06-29 NOTE — Telephone Encounter (Signed)
Done hardcopy to Dahlia  

## 2015-07-12 ENCOUNTER — Encounter: Payer: Self-pay | Admitting: Internal Medicine

## 2015-07-12 ENCOUNTER — Ambulatory Visit (INDEPENDENT_AMBULATORY_CARE_PROVIDER_SITE_OTHER): Payer: BLUE CROSS/BLUE SHIELD | Admitting: Internal Medicine

## 2015-07-12 VITALS — BP 122/80 | HR 83 | Temp 97.9°F | Ht 66.0 in | Wt 153.0 lb

## 2015-07-12 DIAGNOSIS — R05 Cough: Secondary | ICD-10-CM | POA: Diagnosis not present

## 2015-07-12 DIAGNOSIS — I10 Essential (primary) hypertension: Secondary | ICD-10-CM | POA: Diagnosis not present

## 2015-07-12 DIAGNOSIS — R062 Wheezing: Secondary | ICD-10-CM

## 2015-07-12 DIAGNOSIS — J111 Influenza due to unidentified influenza virus with other respiratory manifestations: Secondary | ICD-10-CM

## 2015-07-12 DIAGNOSIS — R059 Cough, unspecified: Secondary | ICD-10-CM

## 2015-07-12 MED ORDER — LEVOFLOXACIN 500 MG PO TABS: 500.0000 mg | ORAL_TABLET | Freq: Every day | ORAL | Status: DC

## 2015-07-12 MED ORDER — HYDROCODONE-HOMATROPINE 5-1.5 MG/5ML PO SYRP: 5.0000 mL | ORAL_SOLUTION | Freq: Four times a day (QID) | ORAL | Status: DC | PRN

## 2015-07-12 MED ORDER — PREDNISONE 10 MG PO TABS: ORAL_TABLET | ORAL | Status: DC

## 2015-07-12 NOTE — Progress Notes (Signed)
Pre visit review using our clinic review tool, if applicable. No additional management support is needed unless otherwise documented below in the visit note. 

## 2015-07-12 NOTE — Assessment & Plan Note (Signed)
stable overall by history and exam, recent data reviewed with pt, and pt to continue medical treatment as before,  to f/u any worsening symptoms or concerns BP Readings from Last 3 Encounters:  07/12/15 122/80  12/11/14 132/78  08/21/14 120/88

## 2015-07-12 NOTE — Assessment & Plan Note (Signed)
Mild to mod, c/w bronchitis vs pna, viral vs atypical bact, for antibx course, cough med prn,  to f/u any worsening symptoms or concerns

## 2015-07-12 NOTE — Patient Instructions (Signed)
Please take all new medication as prescribed - the antibiotic, cough medicine, and prednisone ° °Please continue all other medications as before, and refills have been done if requested. ° °Please have the pharmacy call with any other refills you may need. ° °Please keep your appointments with your specialists as you may have planned ° ° ° °

## 2015-07-12 NOTE — Assessment & Plan Note (Signed)
Mild to mod, c/w bronchospasm type wheezing, for predpac asd,  to f/u any worsening symptoms or concerns

## 2015-07-12 NOTE — Progress Notes (Signed)
Subjective:    Patient ID: [name], female    DOB: 1956/01/23, 59 y.o.   MRN: [id number]  HPI   Here with acute onset mild to mod 2-3 days ST, HA, general weakness and malaise, with prod cough non prod sputum, but Pt denies chest pain, increased sob or doe, wheezing, orthopnea, PND, increased LE swelling, palpitations, dizziness or syncope, except for onset wheezing and mild sob last PM.  Pt denies new neurological symptoms such as new headache, or facial or extremity weakness or numbness   Pt denies polydipsia, polyuria. Past Medical History  Diagnosis Date  . ADD   . ALLERGIC RHINITIS   . GERD   . HOMOCYSTINEMIA   . HYPERTENSION   . IBS   . Incidental lung nodule 02/2011 ct    unchanged 07/16/11 CT, 6 mo f/u  planned   Past Surgical History  Procedure Laterality Date  . Abdominal hysterectomy    . Bladder tack  2001    reports that she has quit smoking. Her smoking use included Cigarettes. She started smoking about 38 years ago. She has never used smokeless tobacco. She reports that she drinks alcohol. She reports that she does not use illicit drugs. family history includes Colon cancer in her cousin, maternal grandfather, and paternal grandfather; Coronary artery disease in her father, maternal aunt, and mother; Diabetes in her father; Hypertension in her father, maternal grandfather, maternal grandmother, mother, paternal grandfather, and paternal grandmother; Stroke in her father, maternal aunt, maternal uncle, and paternal uncle. Allergies  Allergen Reactions  . Nifedipine     REACTION: increased HR   Current Outpatient Prescriptions on File Prior to Visit  Medication Sig Dispense Refill  . amphetamine-dextroamphetamine (ADDERALL) 10 MG tablet Take 1-2 tablets (10-20 mg total) by mouth 2 (two) times daily as needed. 120 tablet 0  . estradiol (ESTRACE) 1 MG tablet Take 1 tablet (1 mg total) by mouth daily. 135 tablet 3  . loratadine (CLARITIN) 10 MG tablet Take 10 mg by  mouth daily as needed.    . Melatonin 3 MG CAPS Take 1 capsule by mouth at bedtime as needed.    Marland Kitchen omeprazole (PRILOSEC OTC) 20 MG tablet Take 20 mg by mouth daily as needed.      . triamterene-hydrochlorothiazide (MAXZIDE-25) 37.5-25 MG per tablet Take 1 tablet by mouth daily. 90 tablet 3  . valACYclovir (VALTREX) 1000 MG tablet Take 2 tablets (2,000 mg total) by mouth 2 (two) times daily as needed. 40 tablet 5  . verapamil (CALAN) 120 MG tablet Take 1 tablet (120 mg total) by mouth 3 (three) times daily. 270 tablet 1  . HYDROcodone-homatropine (HYCODAN) 5-1.5 MG/5ML syrup Take 5 mLs by mouth at bedtime as needed for cough. (Patient not taking: Reported on 07/12/2015) 120 mL 0   No current facility-administered medications on file prior to visit.   Review of Systems  Constitutional: Negative for unusual diaphoresis or night sweats HENT: Negative for ringing in ear or discharge Eyes: Negative for double vision or worsening visual disturbance.  Respiratory: Negative for choking and stridor.   Gastrointestinal: Negative for vomiting or other signifcant bowel change Genitourinary: Negative for hematuria or change in urine volume.  Musculoskeletal: Negative for other MSK pain or swelling Skin: Negative for color change and worsening wound.  Neurological: Negative for tremors and numbness other than noted  Psychiatric/Behavioral: Negative for decreased concentration or agitation other than above       Objective:   Physical Exam BP 122/80 mmHg  Pulse 83  Temp(Src) 97.9 F (36.6 C) (Oral)  Ht 5\' 6"  (1.676 m)  Wt 153 lb (69.4 kg)  BMI 24.71 kg/m2  SpO2 95% VS noted, mild ill Constitutional: Pt appears in no significant distress HENT: Head: NCAT.  Right Ear: External ear normal.  Left Ear: External ear normal.  Bilat tm's with mild erythema.  Max sinus areas non tender.  Pharynx with mild erythema, no exudate Eyes: . Pupils are equal, round, and reactive to light. Conjunctivae and EOM  are normal Neck: Normal range of motion. Neck supple.  Cardiovascular: Normal rate and regular rhythm.   Pulmonary/Chest: Effort normal and breath sounds without rales decreased bilat with few bilat wheezes Neurological: Pt is alert. Not confused , motor grossly intact Skin: Skin is warm. No rash, no LE edema Psychiatric: Pt behavior is normal. No agitation.     Assessment & Plan:

## 2015-07-19 ENCOUNTER — Ambulatory Visit (INDEPENDENT_AMBULATORY_CARE_PROVIDER_SITE_OTHER): Payer: BLUE CROSS/BLUE SHIELD | Admitting: Internal Medicine

## 2015-07-19 ENCOUNTER — Encounter: Payer: Self-pay | Admitting: Internal Medicine

## 2015-07-19 ENCOUNTER — Ambulatory Visit (INDEPENDENT_AMBULATORY_CARE_PROVIDER_SITE_OTHER)
Admission: RE | Admit: 2015-07-19 | Discharge: 2015-07-19 | Disposition: A | Discharge status: Home / Self Care | Payer: BLUE CROSS/BLUE SHIELD | Source: Ambulatory Visit | Attending: Internal Medicine | Admitting: Internal Medicine

## 2015-07-19 VITALS — BP 140/86 | HR 83 | Temp 97.9°F | Resp 20 | Ht 66.0 in | Wt 155.5 lb

## 2015-07-19 DIAGNOSIS — R05 Cough: Secondary | ICD-10-CM

## 2015-07-19 DIAGNOSIS — R059 Cough, unspecified: Secondary | ICD-10-CM

## 2015-07-19 DIAGNOSIS — R062 Wheezing: Secondary | ICD-10-CM

## 2015-07-19 MED ORDER — HYDROCODONE-HOMATROPINE 5-1.5 MG/5ML PO SYRP: 5.0000 mL | ORAL_SOLUTION | Freq: Four times a day (QID) | ORAL | Status: DC | PRN

## 2015-07-19 MED ORDER — ALBUTEROL SULFATE HFA 108 (90 BASE) MCG/ACT IN AERS: 1.0000 | INHALATION_SPRAY | Freq: Four times a day (QID) | RESPIRATORY_TRACT | Status: DC | PRN

## 2015-07-19 NOTE — Progress Notes (Signed)
Pre visit review using our clinic review tool, if applicable. No additional management support is needed unless otherwise documented below in the visit note. 

## 2015-07-19 NOTE — Patient Instructions (Signed)

## 2015-07-21 NOTE — Progress Notes (Signed)
Subjective:  Patient ID: [name], female    DOB: 13-Feb-1956  Age: 59 y.o. MRN: [id number]  CC: Cough   HPI [name] presents for follow-up on cough. She was seen about a week ago and was placed on Levaquin and Hycodan cough syrup. She is also given a prescription for prednisone. She unfortunately has not been taking the cough suppressant. She complains of persistent nonproductive cough, with fatigue, shortness of breath, and wheezing.  Outpatient Prescriptions Prior to Visit  Medication Sig Dispense Refill  . amphetamine-dextroamphetamine (ADDERALL) 10 MG tablet Take 1-2 tablets (10-20 mg total) by mouth 2 (two) times daily as needed. 120 tablet 0  . estradiol (ESTRACE) 1 MG tablet Take 1 tablet (1 mg total) by mouth daily. 135 tablet 3  . levofloxacin (LEVAQUIN) 500 MG tablet Take 1 tablet (500 mg total) by mouth daily. 10 tablet 0  . loratadine (CLARITIN) 10 MG tablet Take 10 mg by mouth daily as needed.    . Melatonin 3 MG CAPS Take 1 capsule by mouth at bedtime as needed.    Marland Kitchen omeprazole (PRILOSEC OTC) 20 MG tablet Take 20 mg by mouth daily as needed.      . predniSONE (DELTASONE) 10 MG tablet 3 tabs by mouth per day for 3 days,2tabs per day for 3 days,1tab per day for 3 days 18 tablet 0  . triamterene-hydrochlorothiazide (MAXZIDE-25) 37.5-25 MG per tablet Take 1 tablet by mouth daily. 90 tablet 3  . valACYclovir (VALTREX) 1000 MG tablet Take 2 tablets (2,000 mg total) by mouth 2 (two) times daily as needed. 40 tablet 5  . verapamil (CALAN) 120 MG tablet Take 1 tablet (120 mg total) by mouth 3 (three) times daily. 270 tablet 1  . HYDROcodone-homatropine (HYCODAN) 5-1.5 MG/5ML syrup Take 5 mLs by mouth at bedtime as needed for cough. 120 mL 0  . HYDROcodone-homatropine (HYCODAN) 5-1.5 MG/5ML syrup Take 5 mLs by mouth every 6 (six) hours as needed for cough. 180 mL 0   No facility-administered medications prior to visit.    ROS Review of Systems  Constitutional:  Positive for fatigue. Negative for fever, chills, diaphoresis, appetite change and unexpected weight change.  HENT: Negative.   Eyes: Negative.   Respiratory: Positive for cough and shortness of breath. Negative for apnea, choking, chest tightness, wheezing and stridor.   Cardiovascular: Negative.  Negative for chest pain, palpitations and leg swelling.  Gastrointestinal: Negative.  Negative for nausea, vomiting, abdominal pain, diarrhea, constipation and blood in stool.  Endocrine: Negative.   Genitourinary: Negative.   Musculoskeletal: Negative.  Negative for myalgias, back pain, joint swelling and arthralgias.  Skin: Negative.  Negative for color change and rash.  Allergic/Immunologic: Negative.   Neurological: Negative.   Hematological: Negative.  Negative for adenopathy. Does not bruise/bleed easily.  Psychiatric/Behavioral: Negative.     Objective:  BP 140/86 mmHg  Pulse 83  Temp(Src) 97.9 F (36.6 C) (Oral)  Resp 20  Ht 5\' 6"  (1.676 m)  Wt 155 lb 8 oz (70.534 kg)  BMI 25.11 kg/m2  SpO2 96%  BP Readings from Last 3 Encounters:  07/19/15 140/86  07/12/15 122/80  12/11/14 132/78    Wt Readings from Last 3 Encounters:  07/19/15 155 lb 8 oz (70.534 kg)  07/12/15 153 lb (69.4 kg)  12/11/14 155 lb 6.4 oz (70.489 kg)    Physical Exam  Constitutional: She is oriented to person, place, and time. She appears well-developed and well-nourished. No distress.  HENT:  Head: Normocephalic  and atraumatic.  Mouth/Throat: Oropharynx is clear and moist. No oropharyngeal exudate.  Eyes: Conjunctivae are normal. Right eye exhibits no discharge. Left eye exhibits no discharge. No scleral icterus.  Neck: Normal range of motion. Neck supple. No JVD present. No tracheal deviation present. No thyromegaly present.  Cardiovascular: Normal rate, regular rhythm, normal heart sounds and intact distal pulses.  Exam reveals no gallop and no friction rub.   No murmur heard. Pulmonary/Chest: Effort  normal and breath sounds normal. No accessory muscle usage or stridor. No respiratory distress. She has no decreased breath sounds. She has no wheezes. She has no rhonchi. She has no rales. She exhibits no tenderness.  Abdominal: Soft. Bowel sounds are normal. She exhibits no distension and no mass. There is no tenderness. There is no rebound and no guarding.  Musculoskeletal: Normal range of motion. She exhibits no edema or tenderness.  Lymphadenopathy:    She has no cervical adenopathy.  Neurological: She is oriented to person, place, and time.  Skin: Skin is warm and dry. No rash noted. She is not diaphoretic. No erythema. No pallor.  Vitals reviewed.   Lab Results  Component Value Date   WBC 3.3* 12/14/2014   HGB 12.4 12/14/2014   HCT 37.1 12/14/2014   PLT 258.0 12/14/2014   GLUCOSE 86 12/14/2014   CHOL 180 12/14/2014   TRIG 59.0 12/14/2014   HDL 86.40 12/14/2014   LDLDIRECT 111.7 12/03/2007   LDLCALC 82 12/14/2014   ALT 21 12/14/2014   AST 24 12/14/2014   NA 139 12/14/2014   K 3.7 12/14/2014   CL 101 12/14/2014   CREATININE 0.75 12/14/2014   BUN 10 12/14/2014   CO2 31 12/14/2014   TSH 1.56 12/14/2014   INR 1.0 12/06/2008    Dg Chest 2 View  07/19/2015  CLINICAL DATA:  Cough and chest congestion and shortness of breath for the past 7 days ; former smoker. EXAM: CHEST  2 VIEW COMPARISON:  CT scan of the chest of January 11, 2015, [place] and lateral chest x-ray of July 14, 2012 FINDINGS: The lungs are well-expanded and clear. The heart and pulmonary vascularity are normal. The mediastinum is normal in width. There is no pleural effusion. The bony thorax is unremarkable. IMPRESSION: The there is no active cardiopulmonary disease. Known lung nodules demonstrated on previous CT studies and felt to be stable and benign are not evident on this radiographic series. Electronically Signed   By: David  [place] M.D.   On: 07/19/2015 12:28    Assessment & Plan:   [name] was seen today for  cough.  Diagnoses and all orders for this visit:  Cough- her chest x-ray is normal, I don't think she has a bacterial infection so have asked her to stop taking Levaquin, she was encouraged to use the Hycodan cough syrup to control her symptoms. -     HYDROcodone-homatropine (HYCODAN) 5-1.5 MG/5ML syrup; Take 5 mLs by mouth every 6 (six) hours as needed for cough. -     DG Chest 2 View; Future  Wheezing- she is already on systemic steroids, I've asked her to start using an albuterol inhaler for additional symptom relief. -     albuterol (PROVENTIL HFA;VENTOLIN HFA) 108 (90 BASE) MCG/ACT inhaler; Inhale 1-2 puffs into the lungs every 6 (six) hours as needed for wheezing or shortness of breath.   I am having Ms. Breidenbach start on albuterol. I am also having her maintain her omeprazole, Melatonin, loratadine, estradiol, triamterene-hydrochlorothiazide, verapamil, valACYclovir, amphetamine-dextroamphetamine, levofloxacin, predniSONE,  and HYDROcodone-homatropine.  Meds ordered this encounter  Medications  . HYDROcodone-homatropine (HYCODAN) 5-1.5 MG/5ML syrup    Sig: Take 5 mLs by mouth every 6 (six) hours as needed for cough.    Dispense:  180 mL    Refill:  0  . albuterol (PROVENTIL HFA;VENTOLIN HFA) 108 (90 BASE) MCG/ACT inhaler    Sig: Inhale 1-2 puffs into the lungs every 6 (six) hours as needed for wheezing or shortness of breath.    Dispense:  18 g    Refill:  3     Follow-up: Return in about 3 weeks (around 08/09/2015).  Sanda Linger, MD

## 2015-07-23 ENCOUNTER — Encounter: Payer: Self-pay | Admitting: Internal Medicine

## 2015-07-30 ENCOUNTER — Telehealth: Payer: Self-pay | Admitting: *Deleted

## 2015-07-30 DIAGNOSIS — F988 Other specified behavioral and emotional disorders with onset usually occurring in childhood and adolescence: Secondary | ICD-10-CM

## 2015-07-30 NOTE — Telephone Encounter (Signed)
Dr. Felicity CoyerLeschber pls advise on refill...Raechel Chute/lmb

## 2015-07-30 NOTE — Telephone Encounter (Signed)
Requesting refill on her adderral.../lmb

## 2015-07-30 NOTE — Telephone Encounter (Signed)
She does not have a PCP listed. [facility] appropriate and will forward.

## 2015-07-31 MED ORDER — AMPHETAMINE-DEXTROAMPHETAMINE 10 MG PO TABS: 10.0000 mg | ORAL_TABLET | Freq: Two times a day (BID) | ORAL | Status: DC | PRN

## 2015-07-31 NOTE — Telephone Encounter (Signed)
30 day refill provided. Please make clear to patient that she must establish with new primary care physician before any other refills will be provided as I will not be in practice after 08/12/2015.

## 2015-07-31 NOTE — Telephone Encounter (Signed)
Notified pt rx ready for pick-up. Inform she must transition to new provider for next refill...Raechel Chute/lmb

## 2015-08-22 ENCOUNTER — Ambulatory Visit (INDEPENDENT_AMBULATORY_CARE_PROVIDER_SITE_OTHER): Payer: BLUE CROSS/BLUE SHIELD | Admitting: Internal Medicine

## 2015-08-22 ENCOUNTER — Encounter: Payer: Self-pay | Admitting: Internal Medicine

## 2015-08-22 VITALS — BP 124/86 | HR 72 | Temp 97.8°F | Resp 16 | Wt 158.0 lb

## 2015-08-22 DIAGNOSIS — K219 Gastro-esophageal reflux disease without esophagitis: Secondary | ICD-10-CM

## 2015-08-22 DIAGNOSIS — F909 Attention-deficit hyperactivity disorder, unspecified type: Secondary | ICD-10-CM | POA: Diagnosis not present

## 2015-08-22 DIAGNOSIS — I10 Essential (primary) hypertension: Secondary | ICD-10-CM | POA: Diagnosis not present

## 2015-08-22 DIAGNOSIS — Z23 Encounter for immunization: Secondary | ICD-10-CM

## 2015-08-22 DIAGNOSIS — F988 Other specified behavioral and emotional disorders with onset usually occurring in childhood and adolescence: Secondary | ICD-10-CM

## 2015-08-22 MED ORDER — AMPHETAMINE-DEXTROAMPHETAMINE 15 MG PO TABS: 15.0000 mg | ORAL_TABLET | Freq: Three times a day (TID) | ORAL | Status: DC

## 2015-08-22 NOTE — Assessment & Plan Note (Signed)
Will try increasing adderall to 15 mg TID - she will let me know if this is more effective

## 2015-08-22 NOTE — Assessment & Plan Note (Signed)
BP Readings from Last 3 Encounters:  08/22/15 124/86  07/19/15 140/86  07/12/15 122/80   bp controlled Continue current medication

## 2015-08-22 NOTE — Patient Instructions (Signed)
   All other Health Maintenance issues reviewed.   All recommended immunizations and age-appropriate screenings are up-to-date.  Establish with a gyn.    Flu vaccine administered today.   Medications reviewed and updated.  We will increase the adderall to 15 mg and see if that is more effective.    Please follow up annually for a physical exam

## 2015-08-22 NOTE — Progress Notes (Signed)
Subjective:    Patient ID: [name], female    DOB: Jun 29, 1956, 60 y.o.   MRN: [id number]  HPI She is here to establish with a new pcp.  She has no concerns.  GERD:  She is taking her medication daily as prescribed.  She denies any GERD symptoms and feels her GERD is well controlled.   Hypertension: She is taking her medication daily. She is compliant with a low sodium diet.  She denies chest pain, palpitations, edema, shortness of breath and regular headaches. She is exercising regularly.  She does not monitor her blood pressure at home.    ADD:  She takes 10 mg Adderall with breakfast, lunch and then at 4pm. She does feel it wearing off before she takes the second dose and third dose.  She is unsure if she needs an increase in dose.       Medications and allergies reviewed with patient and updated if appropriate.  Patient Active Problem List   Diagnosis Date Noted  . Cough 07/12/2015  . Wheezing 07/12/2015  . Routine general medical examination at a health care facility 12/12/2014  . Incidental lung nodule 01/08/2012  . Eczema 01/08/2012  . Attention deficit disorder 04/14/2007  . Essential hypertension 12/09/2006  . ALLERGIC RHINITIS 12/09/2006  . GERD 12/09/2006  . IBS 12/09/2006    Current Outpatient Prescriptions on File Prior to Visit  Medication Sig Dispense Refill  . albuterol (PROVENTIL HFA;VENTOLIN HFA) 108 (90 BASE) MCG/ACT inhaler Inhale 1-2 puffs into the lungs every 6 (six) hours as needed for wheezing or shortness of breath. 18 g 3  . amphetamine-dextroamphetamine (ADDERALL) 10 MG tablet Take 1-2 tablets (10-20 mg total) by mouth 2 (two) times daily as needed. 120 tablet 0  . estradiol (ESTRACE) 1 MG tablet Take 1 tablet (1 mg total) by mouth daily. 135 tablet 3  . loratadine (CLARITIN) 10 MG tablet Take 10 mg by mouth daily as needed.    . Melatonin 3 MG CAPS Take 1 capsule by mouth at bedtime as needed.    Marland Kitchen omeprazole (PRILOSEC OTC) 20 MG tablet Take  20 mg by mouth daily as needed.      . triamterene-hydrochlorothiazide (MAXZIDE-25) 37.5-25 MG per tablet Take 1 tablet by mouth daily. 90 tablet 3  . valACYclovir (VALTREX) 1000 MG tablet Take 2 tablets (2,000 mg total) by mouth 2 (two) times daily as needed. 40 tablet 5  . verapamil (CALAN) 120 MG tablet Take 1 tablet (120 mg total) by mouth 3 (three) times daily. 270 tablet 1   No current facility-administered medications on file prior to visit.    Past Medical History  Diagnosis Date  . ADD   . ALLERGIC RHINITIS   . GERD   . HOMOCYSTINEMIA   . HYPERTENSION   . IBS   . Incidental lung nodule 02/2011 ct    unchanged 07/16/11 CT, 6 mo f/u  planned    Past Surgical History  Procedure Laterality Date  . Abdominal hysterectomy    . Bladder tack  2001    Social History   Social History  . Marital Status: Married    Spouse Name: N/A  . Number of Children: 3  . Years of Education: N/A   Occupational History  . sales    Social History Main Topics  . Smoking status: Former Smoker    Types: Cigarettes    Start date: 08/11/1976  . Smokeless tobacco: Never Used     Comment: Single-occassional boyfriend.  3 children-yougest son with cystic fibrosis. currently unemployed since 01/2010. Prev worked in [employer], frequent travel of town  . Alcohol Use: Yes     Comment: a glass of winw a night  . Drug Use: No  . Sexual Activity: Not Asked   Other Topics Concern  . None   Social History Narrative    Family History  Problem Relation Age of Onset  . Coronary artery disease Mother   . Hypertension Mother   . Coronary artery disease Father   . Hypertension Father   . Diabetes Father   . Stroke Father   . Coronary artery disease Maternal Aunt   . Stroke Maternal Aunt   . Stroke Maternal Uncle   . Stroke Paternal Uncle   . Hypertension Maternal Grandmother   . Hypertension Maternal Grandfather   . Colon cancer Maternal Grandfather   . Hypertension Paternal Grandmother   .  Hypertension Paternal Grandfather   . Colon cancer Paternal Grandfather   . Colon cancer Cousin     Review of Systems  Constitutional: Negative for fever and chills.  Respiratory: Negative for cough, shortness of breath and wheezing.   Cardiovascular: Negative for chest pain, palpitations and leg swelling.  Gastrointestinal: Negative for nausea and abdominal pain.       No GERD  Neurological: Negative for dizziness, light-headedness and headaches.       Objective:   Filed Vitals:   08/22/15 0829  BP: 124/86  Pulse: 72  Temp: 97.8 F (36.6 C)  Resp: 16   Filed Weights   08/22/15 0829  Weight: 158 lb (71.668 kg)   Body mass index is 25.51 kg/(m^2).   Physical Exam Constitutional: Appears well-developed and well-nourished. No distress.  Neck: Neck supple. No tracheal deviation present. No thyromegaly present.  No carotid bruit. No cervical adenopathy.   Cardiovascular: Normal rate, regular rhythm and normal heart sounds.   No murmur heard.  No edema Pulmonary/Chest: Effort normal and breath sounds normal. No respiratory distress. No wheezes.       Assessment & Plan:    See Problem List for Assessment and Plan of chronic medical problems.  Follow up annually

## 2015-08-22 NOTE — Assessment & Plan Note (Signed)
Controlled Continue daily medication 

## 2015-09-24 ENCOUNTER — Telehealth: Payer: Self-pay | Admitting: *Deleted

## 2015-09-24 MED ORDER — AMPHETAMINE-DEXTROAMPHETAMINE 15 MG PO TABS: 15.0000 mg | ORAL_TABLET | Freq: Three times a day (TID) | ORAL | Status: DC

## 2015-09-24 NOTE — Telephone Encounter (Signed)
Notified pt rx ready for pick-up.../lmb 

## 2015-09-24 NOTE — Telephone Encounter (Signed)
Pt is requesting refill on her Adderral. MD out of office. Pls advise and send back to triage Julia Gu).....[name]

## 2015-09-24 NOTE — Telephone Encounter (Signed)
Done hardcopy to steph 

## 2015-10-22 ENCOUNTER — Telehealth: Payer: Self-pay | Admitting: *Deleted

## 2015-10-22 MED ORDER — AMPHETAMINE-DEXTROAMPHETAMINE 15 MG PO TABS: 15.0000 mg | ORAL_TABLET | Freq: Three times a day (TID) | ORAL | Status: DC

## 2015-10-22 NOTE — Telephone Encounter (Signed)
Prescription printed

## 2015-10-22 NOTE — Telephone Encounter (Signed)
Requesting refill on her adderrall...[name]/lmb

## 2015-10-23 NOTE — Telephone Encounter (Signed)
Called pt no answer LMOM rx ready for pick-up.../LMB 

## 2015-11-06 ENCOUNTER — Other Ambulatory Visit: Payer: Self-pay | Admitting: Internal Medicine

## 2015-11-20 ENCOUNTER — Telehealth: Payer: Self-pay | Admitting: *Deleted

## 2015-11-20 MED ORDER — AMPHETAMINE-DEXTROAMPHETAMINE 15 MG PO TABS: 15.0000 mg | ORAL_TABLET | Freq: Three times a day (TID) | ORAL | Status: DC

## 2015-11-20 NOTE — Telephone Encounter (Signed)
Received call pt is requesting refill on her Adderral.../lmb

## 2015-11-20 NOTE — Telephone Encounter (Signed)
Notified pt rx ready for pick-up.../lmb 

## 2015-11-20 NOTE — Telephone Encounter (Signed)
printed

## 2016-02-01 ENCOUNTER — Other Ambulatory Visit: Payer: Self-pay | Admitting: Internal Medicine

## 2016-02-11 ENCOUNTER — Encounter: Payer: Self-pay | Admitting: Internal Medicine

## 2016-02-11 MED ORDER — PREDNISONE 10 MG PO TABS: ORAL_TABLET | ORAL | Status: DC

## 2016-02-11 NOTE — Telephone Encounter (Signed)
Let her know a prescription was sent to her pharmacy.

## 2016-02-13 ENCOUNTER — Other Ambulatory Visit: Payer: Self-pay | Admitting: Internal Medicine

## 2016-02-13 MED ORDER — AMPHETAMINE-DEXTROAMPHETAMINE 15 MG PO TABS: 15.0000 mg | ORAL_TABLET | Freq: Three times a day (TID) | ORAL | Status: DC

## 2016-02-13 NOTE — Telephone Encounter (Signed)
Please advise, Last fill 11/20/15, Last OV 08/22/15

## 2016-02-29 ENCOUNTER — Other Ambulatory Visit: Payer: Self-pay | Admitting: Internal Medicine

## 2016-03-14 ENCOUNTER — Other Ambulatory Visit: Payer: Self-pay | Admitting: Internal Medicine

## 2016-03-14 MED ORDER — AMPHETAMINE-DEXTROAMPHETAMINE 15 MG PO TABS: 15.0000 mg | ORAL_TABLET | Freq: Three times a day (TID) | ORAL | 0 refills | Status: DC

## 2016-03-14 NOTE — Telephone Encounter (Signed)
Pt informed, Rx in cabinet for pt pick up  

## 2016-03-14 NOTE — Telephone Encounter (Signed)
MD out of the office today & next week -pls advise on refill...Raechel Chute

## 2016-04-16 ENCOUNTER — Other Ambulatory Visit: Payer: Self-pay | Admitting: Internal Medicine

## 2016-04-16 MED ORDER — AMPHETAMINE-DEXTROAMPHETAMINE 15 MG PO TABS: 15.0000 mg | ORAL_TABLET | Freq: Three times a day (TID) | ORAL | 0 refills | Status: DC

## 2016-04-16 NOTE — Telephone Encounter (Signed)
[place] controlled substance database checked.    rx printed. 

## 2016-04-16 NOTE — Telephone Encounter (Signed)
Pt is aware rx is ready for pick up. 

## 2016-05-23 ENCOUNTER — Other Ambulatory Visit: Payer: Self-pay | Admitting: Internal Medicine

## 2016-05-23 MED ORDER — AMPHETAMINE-DEXTROAMPHETAMINE 15 MG PO TABS: 15.0000 mg | ORAL_TABLET | Freq: Three times a day (TID) | ORAL | 0 refills | Status: DC

## 2016-05-23 NOTE — Telephone Encounter (Signed)
Pt came into office. RX printed, signed, and given to pt.

## 2016-06-23 ENCOUNTER — Other Ambulatory Visit: Payer: Self-pay | Admitting: Internal Medicine

## 2016-06-23 MED ORDER — AMPHETAMINE-DEXTROAMPHETAMINE 15 MG PO TABS: 15.0000 mg | ORAL_TABLET | Freq: Three times a day (TID) | ORAL | 0 refills | Status: DC

## 2016-06-23 NOTE — Telephone Encounter (Signed)
[place] controlled substance database checked.  Ok to fill medication.  

## 2016-07-17 ENCOUNTER — Encounter: Payer: Self-pay | Admitting: Internal Medicine

## 2016-07-17 ENCOUNTER — Other Ambulatory Visit: Payer: Self-pay | Admitting: Internal Medicine

## 2016-07-18 MED ORDER — AMPHETAMINE-DEXTROAMPHETAMINE 15 MG PO TABS: 15.0000 mg | ORAL_TABLET | Freq: Three times a day (TID) | ORAL | 0 refills | Status: DC

## 2016-07-18 NOTE — Telephone Encounter (Signed)
controlled substance database checked.  Ok to fill medication. rx printed 

## 2016-07-19 MED ORDER — PREDNISONE 10 MG PO TABS: ORAL_TABLET | ORAL | 0 refills | Status: DC

## 2016-07-21 NOTE — Telephone Encounter (Signed)
Pt notified by MyChart.

## 2016-08-04 IMAGING — CT CT CHEST W/O CM
2 of 3 series · 15 of 36 positions shown, 18 images · IV contrast (Omnipaque 300)
Comparison: 01/12/2012 and 03/17/2013 and 07/24/2011

CLINICAL DATA: Follow-up lung nodules.

EXAM:
CT CHEST WITHOUT CONTRAST
TECHNIQUE: Multidetector CT imaging of the chest was performed following the
standard protocol without IV contrast.

[Series 2: chest routine with · axial · 0.64mm/px · z∈[-344,-50]mm · 12 of 71 slices shown, 15 images]
[im 6/71  mediastinal]
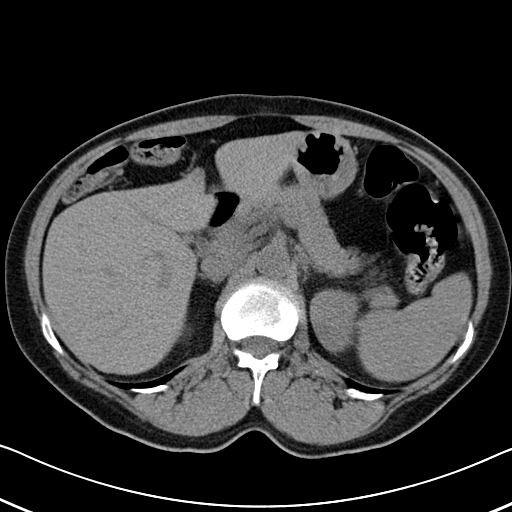
[im 6/71  lung]
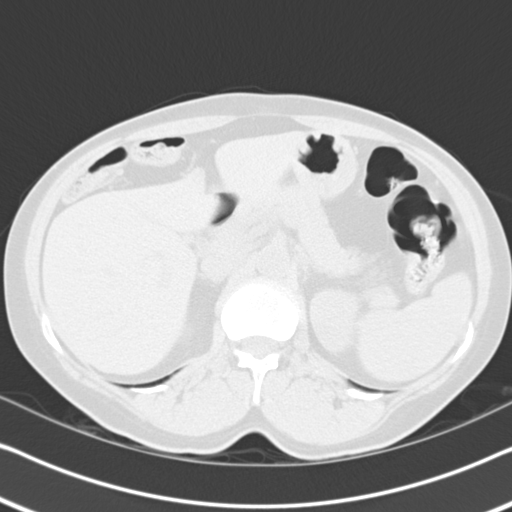
[im 11/71  lung]
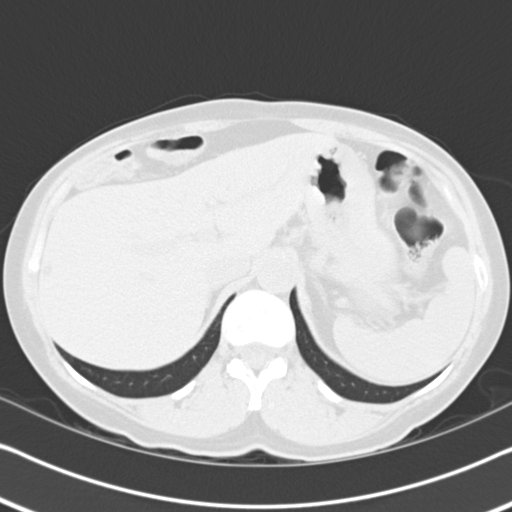
[im 16/71  lung]
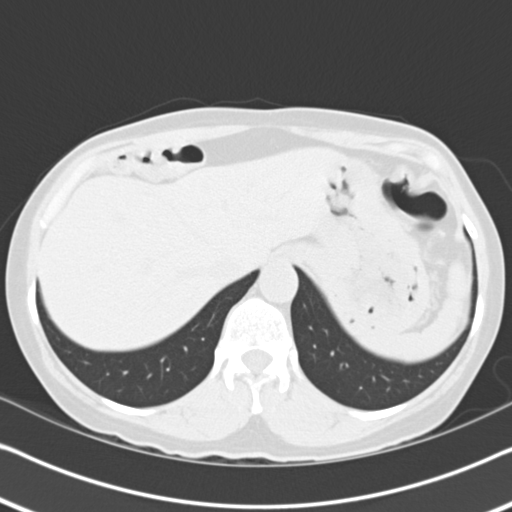
[im 21/71  lung]
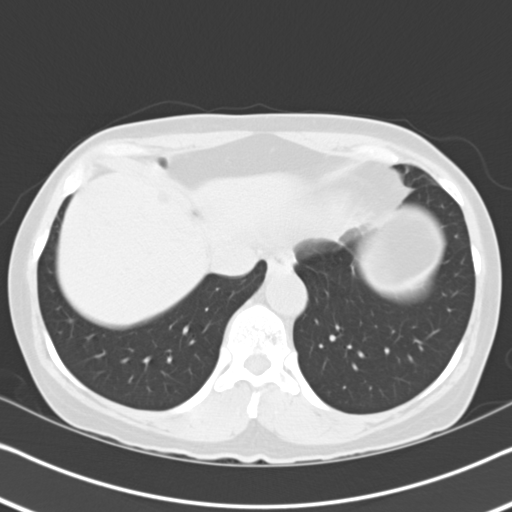
[im 26/71  mediastinal]
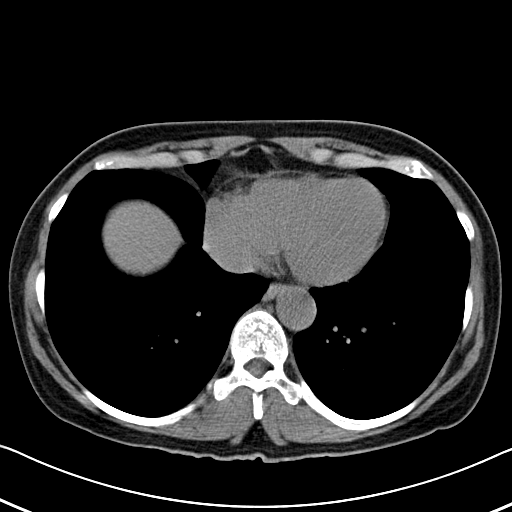
[im 26/71  lung]
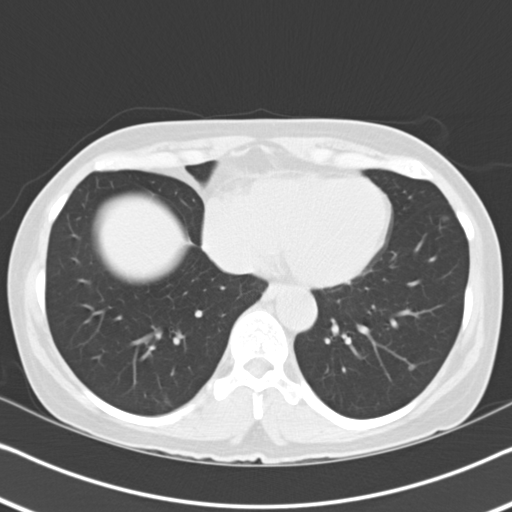
[im 32/71  lung]
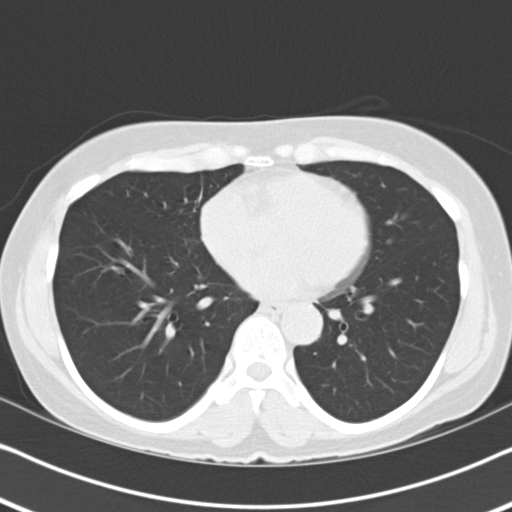
[im 39/71  lung]
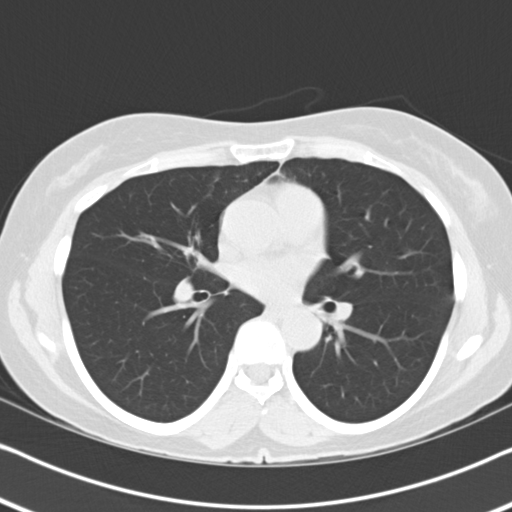
[im 45/71  lung]
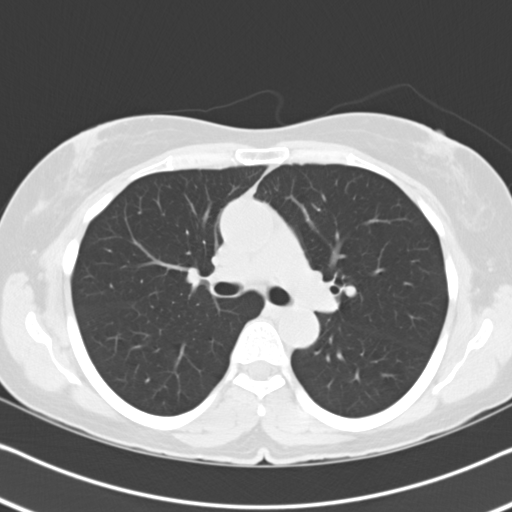
[im 50/71  mediastinal]
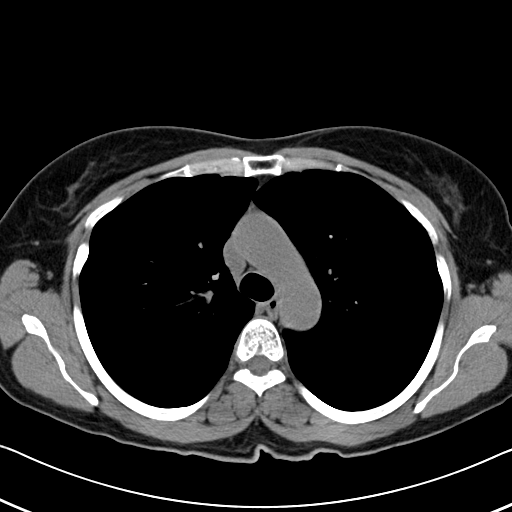
[im 50/71  lung]
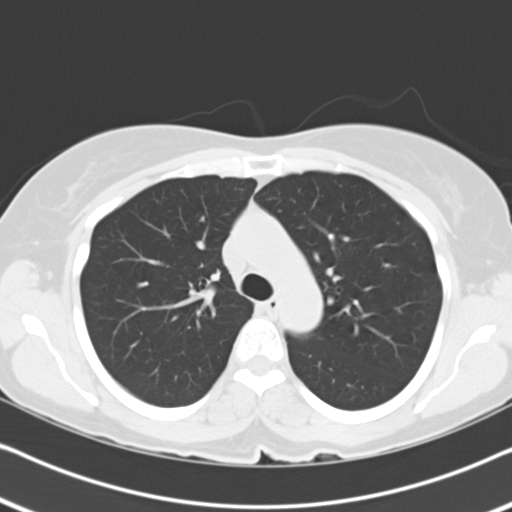
[im 55/71  lung]
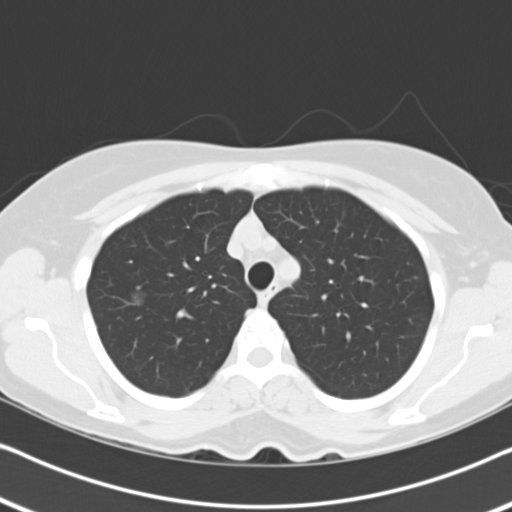
[im 60/71  lung]
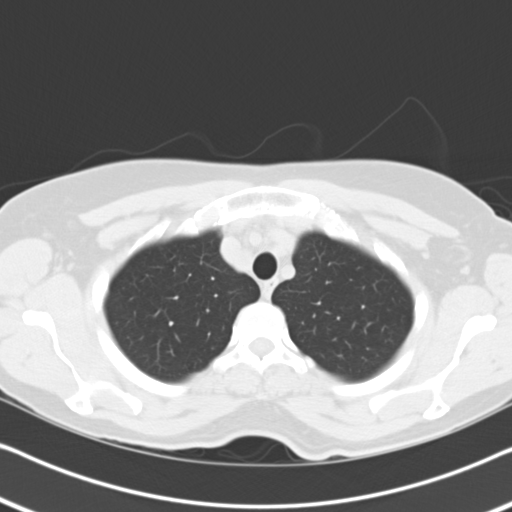
[im 65/71  lung]
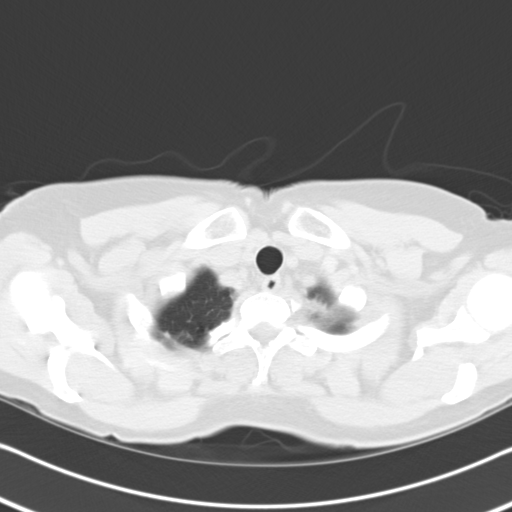

[Series 602: coronal · coronal · 0.71mm/px · 3 of 109 slices shown]
[im 22/109  lung]
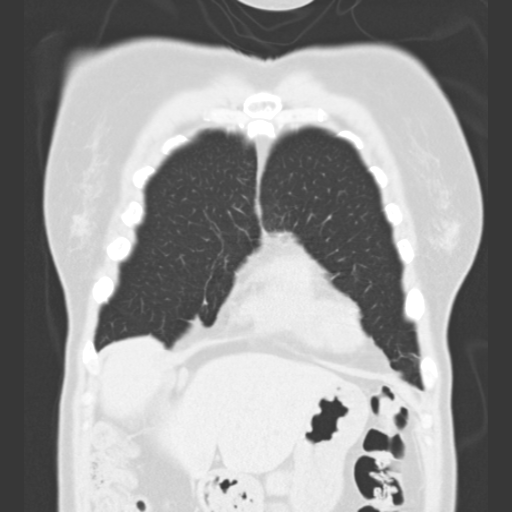
[im 44/109  lung]
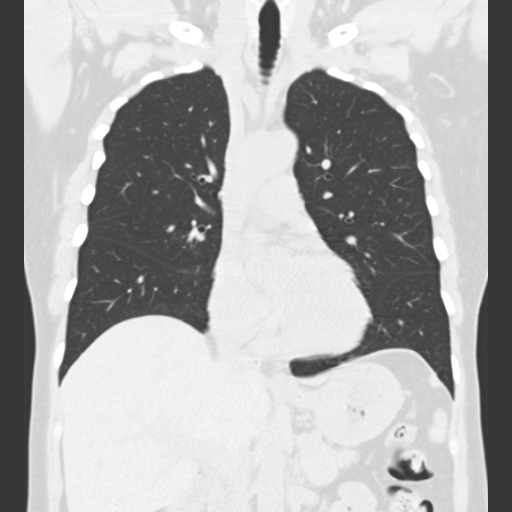
[im 65/109  lung]
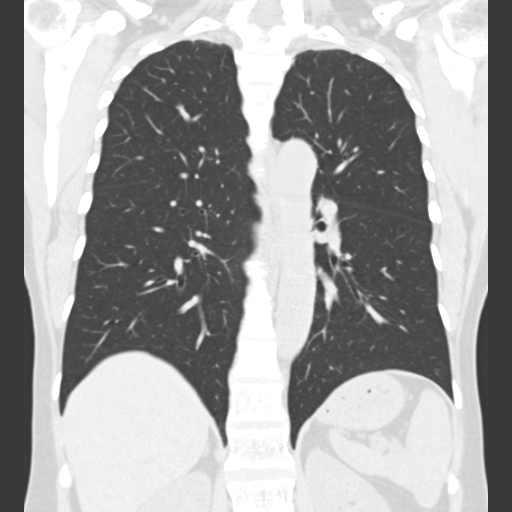

[15 of 36 positions shown; findings below may reference images not displayed]

FINDINGS: There is no evidence for chest lymphadenopathy. There is no
significant pericardial or pleural fluid. Mid ascending thoracic
aorta measures roughly 3.4 cm. Stable low-density structures in the
liver likely represent a benign etiology such as cysts. No acute
abnormality in the upper abdomen.

The trachea and mainstem bronchi are patent. Stable
pleural-parenchymal scarring at the lung apices. Stable 0.2 cm
nodule at the right lung apex on sequence 3, image 9. There is a
persistent ground-glass density in the right upper lung on sequence
3, image 17. Maximum diameter of this lesion measures 1.0 cm and
unchanged since 4834. There is no significant solid component to
this lesion. Subtle peripheral density in the posterior right lower
lobe on sequence 3, image 46 has minimally changed since 4834 and
probably represents a small area of scarring. Punctate nodule in the
left upper lobe on sequence 3, image 17 is unchanged since 4834.
Stable 0.3 cm nodule in the lingula on sequence 3, image 41 and
unchanged since 4834. Punctate nodule in left lower lobe on sequence
3, image 48 is unchanged since 3653. Stable 0.4 cm nodule in left
lower lobe on image 46. 0.5 cm nodule in the left lower lobe on
image 42 is unchanged since 4834. No new pulmonary nodules. No new
areas of airspace disease or consolidation.

No acute bone abnormality.
IMPRESSION: Stable chest CT. The right upper lobe ground-glass nodule is
unchanged since 07/24/2011. Stability of this nodule for greater
than 3 years is reassuring and likely represents a benign etiology.
The small solid lung nodules are unchanged since at least 3653 and
considered benign.

## 2016-08-06 ENCOUNTER — Telehealth: Payer: Self-pay | Admitting: Emergency Medicine

## 2016-08-06 NOTE — Telephone Encounter (Signed)
Requesting refill for Valtrex, last seen 08/2015, last fill 03/2015. Please advise. [facility]

## 2016-08-07 MED ORDER — VALACYCLOVIR HCL 1 G PO TABS: 2000.0000 mg | ORAL_TABLET | Freq: Two times a day (BID) | ORAL | 5 refills | Status: DC | PRN

## 2016-08-07 NOTE — Addendum Note (Signed)
Addended by: Pincus SanesBURNS, Rondarius Kadrmas J on: 08/07/2016 05:20 AM   Modules accepted: Orders

## 2016-08-07 NOTE — Telephone Encounter (Signed)
Ok to fill.  rx sent 

## 2016-08-17 ENCOUNTER — Other Ambulatory Visit: Payer: Self-pay | Admitting: Internal Medicine

## 2016-08-20 ENCOUNTER — Other Ambulatory Visit: Payer: Self-pay | Admitting: Internal Medicine

## 2016-08-20 MED ORDER — AMPHETAMINE-DEXTROAMPHETAMINE 15 MG PO TABS: 15.0000 mg | ORAL_TABLET | Freq: Three times a day (TID) | ORAL | 0 refills | Status: DC

## 2016-08-20 NOTE — Telephone Encounter (Signed)
Printed script, MD sign, sent pt mychart msg stating rx ready for pick-up...Raechel Chute/lmb

## 2016-08-20 NOTE — Telephone Encounter (Signed)
[place] controlled substance database checked.  Ok to fill medication.  

## 2016-09-19 ENCOUNTER — Other Ambulatory Visit: Payer: Self-pay | Admitting: Internal Medicine

## 2016-09-20 MED ORDER — AMPHETAMINE-DEXTROAMPHETAMINE 15 MG PO TABS: 15.0000 mg | ORAL_TABLET | Freq: Three times a day (TID) | ORAL | 0 refills | Status: DC

## 2016-09-20 NOTE — Telephone Encounter (Signed)
controlled substance database checked.  Ok to fill medication. rx printed 

## 2016-09-22 NOTE — Telephone Encounter (Signed)
Sent pt mychart msg rx ready for pick-up..../lmb 

## 2016-10-03 ENCOUNTER — Other Ambulatory Visit: Payer: Self-pay | Admitting: Internal Medicine

## 2016-10-18 ENCOUNTER — Other Ambulatory Visit: Payer: Self-pay | Admitting: Internal Medicine

## 2016-10-19 ENCOUNTER — Telehealth: Payer: Self-pay | Admitting: Internal Medicine

## 2016-10-19 MED ORDER — AMPHETAMINE-DEXTROAMPHETAMINE 15 MG PO TABS: 15.0000 mg | ORAL_TABLET | Freq: Three times a day (TID) | ORAL | 0 refills | Status: DC

## 2016-10-19 NOTE — Telephone Encounter (Signed)
Adderall rx requested and printed.  She needs a follow up appt - last seen one year ago.

## 2016-10-20 NOTE — Telephone Encounter (Signed)
Contacted pt, states she will call back to schedule appt. States she does not currently have insurance.

## 2016-12-26 ENCOUNTER — Telehealth: Payer: Self-pay | Admitting: Internal Medicine

## 2016-12-26 MED ORDER — AMPHETAMINE-DEXTROAMPHETAMINE 15 MG PO TABS: 15.0000 mg | ORAL_TABLET | Freq: Three times a day (TID) | ORAL | 0 refills | Status: DC

## 2016-12-26 NOTE — Telephone Encounter (Signed)
Spoke with pt. She is currently waiting on her new insurance to kick in and is dealing with her mother estate due to her death last week. She has schedule an appt for June 4th. Can we refill her Adderall until her appt? Last fill was in March.

## 2016-12-26 NOTE — Telephone Encounter (Signed)
Ok to fill. printed 

## 2016-12-26 NOTE — Telephone Encounter (Signed)
Requesting a call back in regard to an appt.  Did not want to discuss any further with me.

## 2016-12-26 NOTE — Telephone Encounter (Signed)
RX placed up front for pickup

## 2016-12-29 ENCOUNTER — Other Ambulatory Visit: Payer: Self-pay | Admitting: Internal Medicine

## 2017-01-12 ENCOUNTER — Other Ambulatory Visit (INDEPENDENT_AMBULATORY_CARE_PROVIDER_SITE_OTHER): Payer: Self-pay

## 2017-01-12 ENCOUNTER — Ambulatory Visit (INDEPENDENT_AMBULATORY_CARE_PROVIDER_SITE_OTHER): Payer: BLUE CROSS/BLUE SHIELD | Admitting: Internal Medicine

## 2017-01-12 ENCOUNTER — Encounter: Payer: Self-pay | Admitting: Internal Medicine

## 2017-01-12 VITALS — BP 128/88 | HR 83 | Temp 98.7°F | Resp 16 | Wt 153.0 lb

## 2017-01-12 DIAGNOSIS — I10 Essential (primary) hypertension: Secondary | ICD-10-CM

## 2017-01-12 DIAGNOSIS — K219 Gastro-esophageal reflux disease without esophagitis: Secondary | ICD-10-CM

## 2017-01-12 DIAGNOSIS — F988 Other specified behavioral and emotional disorders with onset usually occurring in childhood and adolescence: Secondary | ICD-10-CM

## 2017-01-12 DIAGNOSIS — M79661 Pain in right lower leg: Secondary | ICD-10-CM

## 2017-01-12 LAB — COMPREHENSIVE METABOLIC PANEL
ALT: 22 U/L (ref 0–35)
AST: 25 U/L (ref 0–37)
Albumin: 4.4 g/dL (ref 3.5–5.2)
Alkaline Phosphatase: 60 U/L (ref 39–117)
BILIRUBIN TOTAL: 0.4 mg/dL (ref 0.2–1.2)
BUN: 12 mg/dL (ref 6–23)
CALCIUM: 9.4 mg/dL (ref 8.4–10.5)
CHLORIDE: 98 meq/L (ref 96–112)
CO2: 31 meq/L (ref 19–32)
Creatinine, Ser: 0.74 mg/dL (ref 0.40–1.20)
GFR: 103.3 mL/min (ref >60.00)
GLUCOSE: 100 mg/dL — AB (ref 70–99)
POTASSIUM: 3.3 meq/L — AB (ref 3.5–5.1)
Sodium: 137 mEq/L (ref 135–145)
Total Protein: 7.3 g/dL (ref 6.0–8.3)

## 2017-01-12 MED ORDER — AMPHETAMINE-DEXTROAMPHETAMINE 15 MG PO TABS: 15.0000 mg | ORAL_TABLET | Freq: Three times a day (TID) | ORAL | 0 refills | Status: DC

## 2017-01-12 MED ORDER — AMPHETAMINE-DEXTROAMPHETAMINE 15 MG PO TABS
15.0000 mg | ORAL_TABLET | Freq: Three times a day (TID) | ORAL | 0 refills | Status: DC
Start: 2017-01-12 — End: 2017-04-23

## 2017-01-12 NOTE — Assessment & Plan Note (Signed)
BP well controlled Current regimen effective and well tolerated Continue current medications at current doses cmp  

## 2017-01-12 NOTE — Assessment & Plan Note (Signed)
Controlled, stable Taking medication correctly Continue current dose of medication

## 2017-01-12 NOTE — Patient Instructions (Addendum)
  Test(s) ordered today. Your results will be released to MyChart (or called to you) after review, usually within 72hours after test completion. If any changes need to be made, you will be notified at that same time.  All other Health Maintenance issues reviewed.   All recommended immunizations and age-appropriate screenings are up-to-date or discussed.  No immunizations administered today.   Medications reviewed and updated.  No changes recommended at this time.    Please followup in 1 year, sooner if needed

## 2017-01-12 NOTE — Progress Notes (Signed)
Subjective:    Patient ID: [name], female    DOB: 1955/12/08, 61 y.o.   MRN: [id number]  HPI The patient is here for follow up.  ADD:  She is taking her medication as prescribed.  She feels the medication is effective.  She denies side effects, including palpitations, headaches, lightheadedness, decreased appetite and weight loss.    GERD:  She is taking her medication daily as prescribed.  She denies any GERD symptoms and feels her GERD is well controlled.  She can go a few days without the medication without symptoms.   Hypertension: She is taking her medication daily. She is compliant with a low sodium diet.  She denies chest pain, palpitations, edema, shortness of breath and regular headaches. 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      Leg pain, right leg:  It hurts in the center of the calf.  Last week it hurt all week.  She denies injuries or new activities. It started several months ago.  It is not daily.  Advil helps.  She notices it a lot when driving - the position of her foot.  She also notices when she is the passenger.  She denies swelling, numbness/tingling.  That is the foot she was bit by the copperhead several years ago.    Medications and allergies reviewed with patient and updated if appropriate.  Patient Active Problem List   Diagnosis Date Noted  . Incidental lung nodule 01/08/2012  . Eczema 01/08/2012  . Attention deficit disorder 04/14/2007  . Essential hypertension 12/09/2006  . ALLERGIC RHINITIS 12/09/2006  . GERD 12/09/2006  . IBS 12/09/2006    Current Outpatient Prescriptions on File Prior to Visit  Medication Sig Dispense Refill  . amphetamine-dextroamphetamine (ADDERALL) 15 MG tablet Take 1 tablet by mouth 3 (three) times daily. 90 tablet 0  . estradiol (ESTRACE) 1 MG tablet TAKE 1 TABLET (1 MG TOTAL) BY MOUTH DAILY. 135 tablet 2  . loratadine (CLARITIN) 10 MG tablet Take  10 mg by mouth daily as needed.    . Melatonin 3 MG CAPS Take 1 capsule by mouth at bedtime as needed.    Marland Kitchen omeprazole (PRILOSEC OTC) 20 MG tablet Take 20 mg by mouth daily as needed.      . triamterene-hydrochlorothiazide (MAXZIDE-25) 37.5-25 MG tablet Take 1 tablet by mouth daily. --- Office visit needed for further refills 90 tablet 1  . valACYclovir (VALTREX) 1000 MG tablet Take 2 tablets (2,000 mg total) by mouth 2 (two) times daily as needed. 40 tablet 5  . verapamil (CALAN) 120 MG tablet Take 1 tablet (120 mg total) by mouth 3 (three) times daily. 270 tablet 0   No current facility-administered medications on file prior to visit.     Past Medical History:  Diagnosis Date  . ADD   . ALLERGIC RHINITIS   . GERD   . HOMOCYSTINEMIA   . HYPERTENSION   . IBS   . Incidental lung nodule 02/2011 ct   unchanged 07/16/11 CT, 6 mo f/u  planned    Past Surgical History:  Procedure Laterality Date  . ABDOMINAL HYSTERECTOMY    . Bladder tack  2001    Social History   Social History  . Marital status: Married    Spouse name: N/A  . Number of children: 3  . Years of education: N/A   Occupational History  . sales [employer]  Social History Main Topics  . Smoking status: Former Smoker    Types: Cigarettes    Start date: 08/11/1976  . Smokeless tobacco: Never Used     Comment: Single-occassional boyfriend. 3 children-yougest son with cystic fibrosis. currently unemployed since 01/2010. Prev worked in [employer], frequent travel of town  . Alcohol use Yes     Comment: a glass of winw a night  . Drug use: No  . Sexual activity: Not on file   Other Topics Concern  . Not on file   Social History Narrative  . No narrative on file    Family History  Problem Relation Age of Onset  . Coronary artery disease Mother   . Hypertension Mother   . Coronary artery disease Father   . Hypertension Father   . Diabetes Father   . Stroke Father   . Coronary artery disease Maternal Aunt    . Stroke Maternal Aunt   . Stroke Maternal Uncle   . Stroke Paternal Uncle   . Hypertension Maternal Grandmother   . Hypertension Maternal Grandfather   . Colon cancer Maternal Grandfather   . Hypertension Paternal Grandmother   . Hypertension Paternal Grandfather   . Colon cancer Paternal Grandfather   . Colon cancer Cousin     Review of Systems  Constitutional: Negative for chills and fever.  Respiratory: Negative for cough, shortness of breath and wheezing.   Cardiovascular: Negative for chest pain, palpitations and leg swelling.  Neurological: Negative for light-headedness and headaches.       Objective:   Vitals:   01/12/17 1412  BP: 128/88  Pulse: 83  Resp: 16  Temp: 98.7 F (37.1 C)   Wt Readings from Last 3 Encounters:  01/12/17 153 lb (69.4 kg)  08/22/15 158 lb (71.7 kg)  07/19/15 155 lb 8 oz (70.5 kg)   Body mass index is 24.69 kg/m.   Physical Exam    Constitutional: Appears well-developed and well-nourished. No distress.  HENT:  Head: Normocephalic and atraumatic.  Neck: Neck supple. No tracheal deviation present. No thyromegaly present.  No cervical lymphadenopathy Cardiovascular: Normal rate, regular rhythm and normal heart sounds.   No murmur heard. No carotid bruit .  No edema Pulmonary/Chest: Effort normal and breath sounds normal. No respiratory distress. No has no wheezes. No rales.  Skin: Skin is warm and dry. Not diaphoretic.  Psychiatric: Normal mood and affect. Behavior is normal.      Assessment & Plan:    See Problem List for Assessment and Plan of chronic medical problems.

## 2017-01-12 NOTE — Assessment & Plan Note (Addendum)
GERD controlled Trial of zantac Discussed concerns of long term PPI

## 2017-01-12 NOTE — Assessment & Plan Note (Addendum)
Present for months, intermittent On HRT, but given during and intermittent nature unlikely DVT ? Neuropathy from prior snake bite, msk Declined referral to sports medicine Declined US now - will let me know if pain continues

## 2017-02-09 IMAGING — DX DG CHEST 2V
2 series · 2 of 2 positions shown · non-contrast
Comparison: CT scan of the chest January 11, 2015, PA and lateral
chest x-ray July 14, 2012

CLINICAL DATA: Cough and chest congestion and shortness of breath
for the past 7 days ; former smoker.

EXAM:
CHEST  2 VIEW

[chest pa]
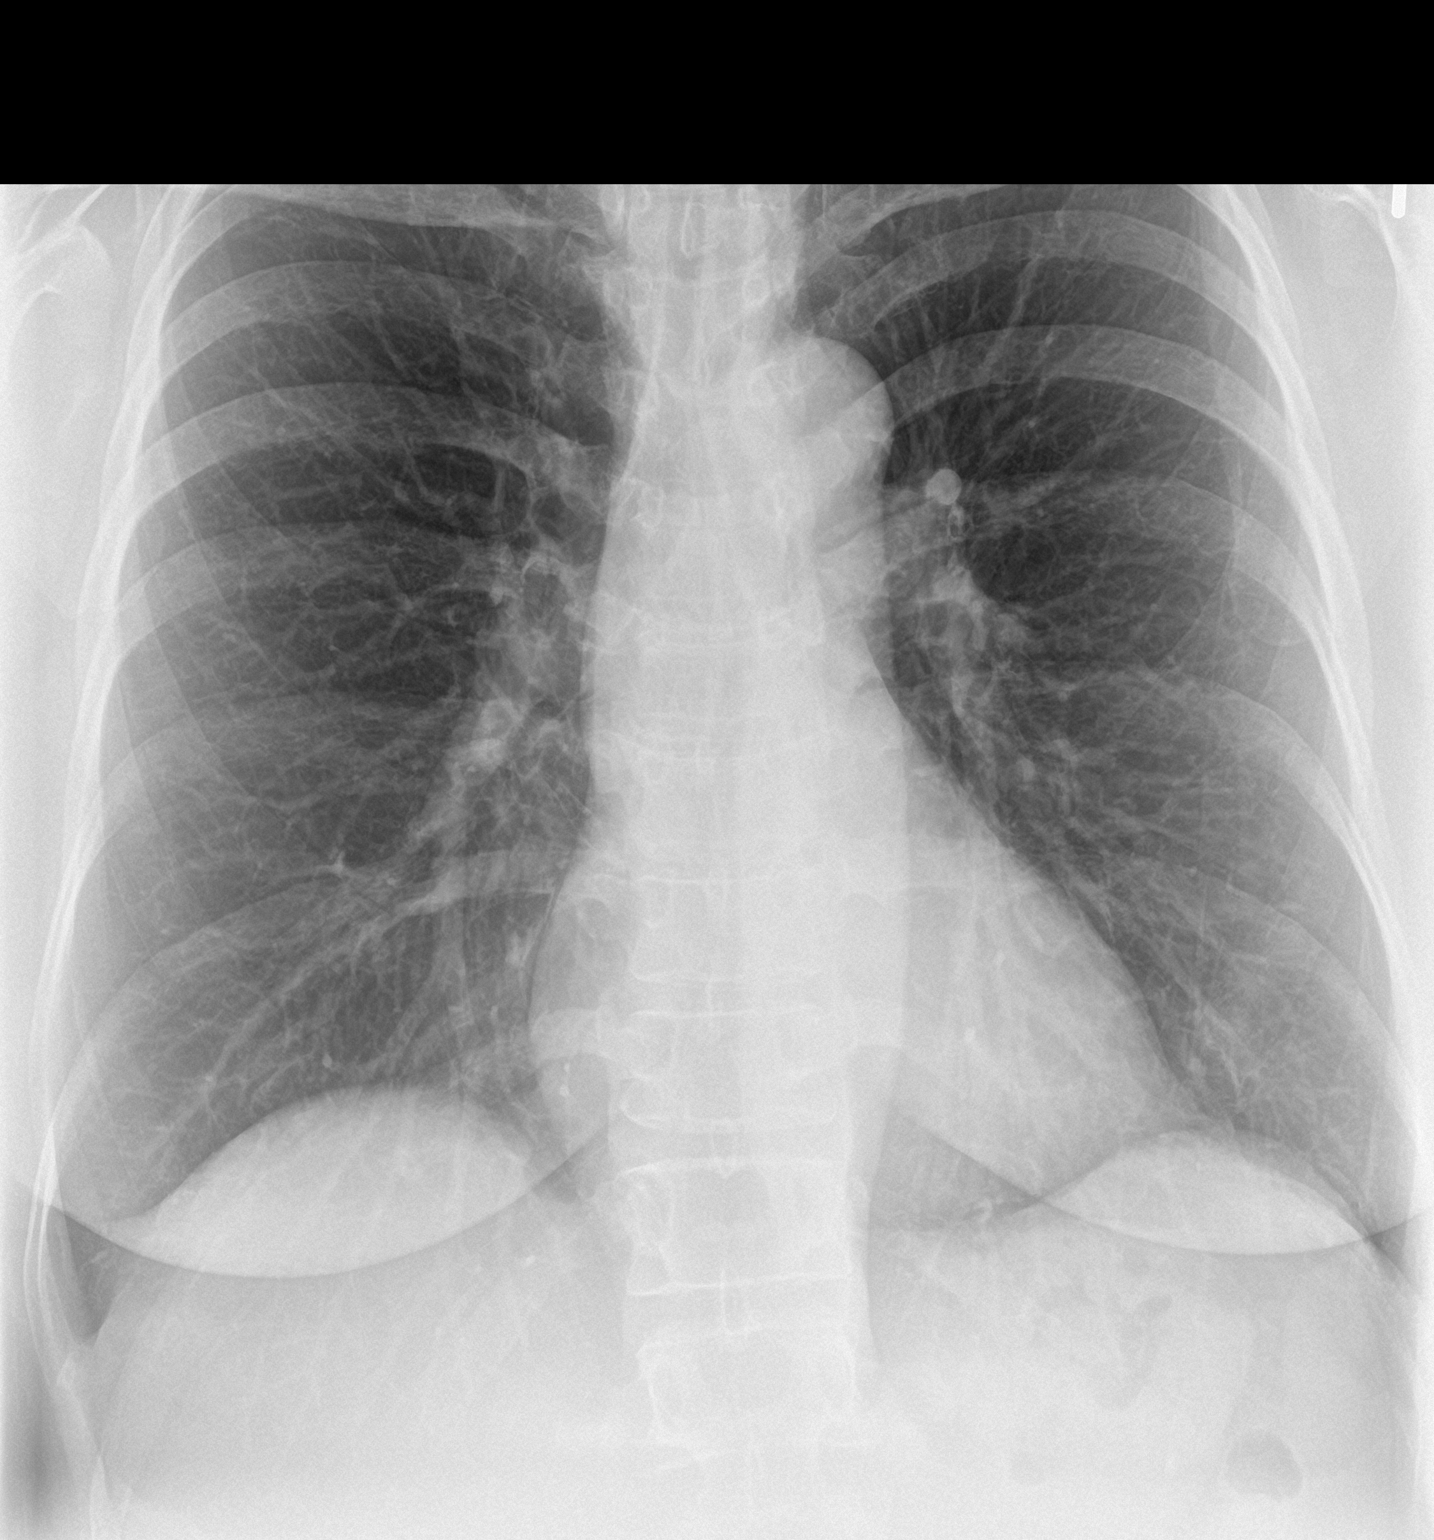

[chest lat]
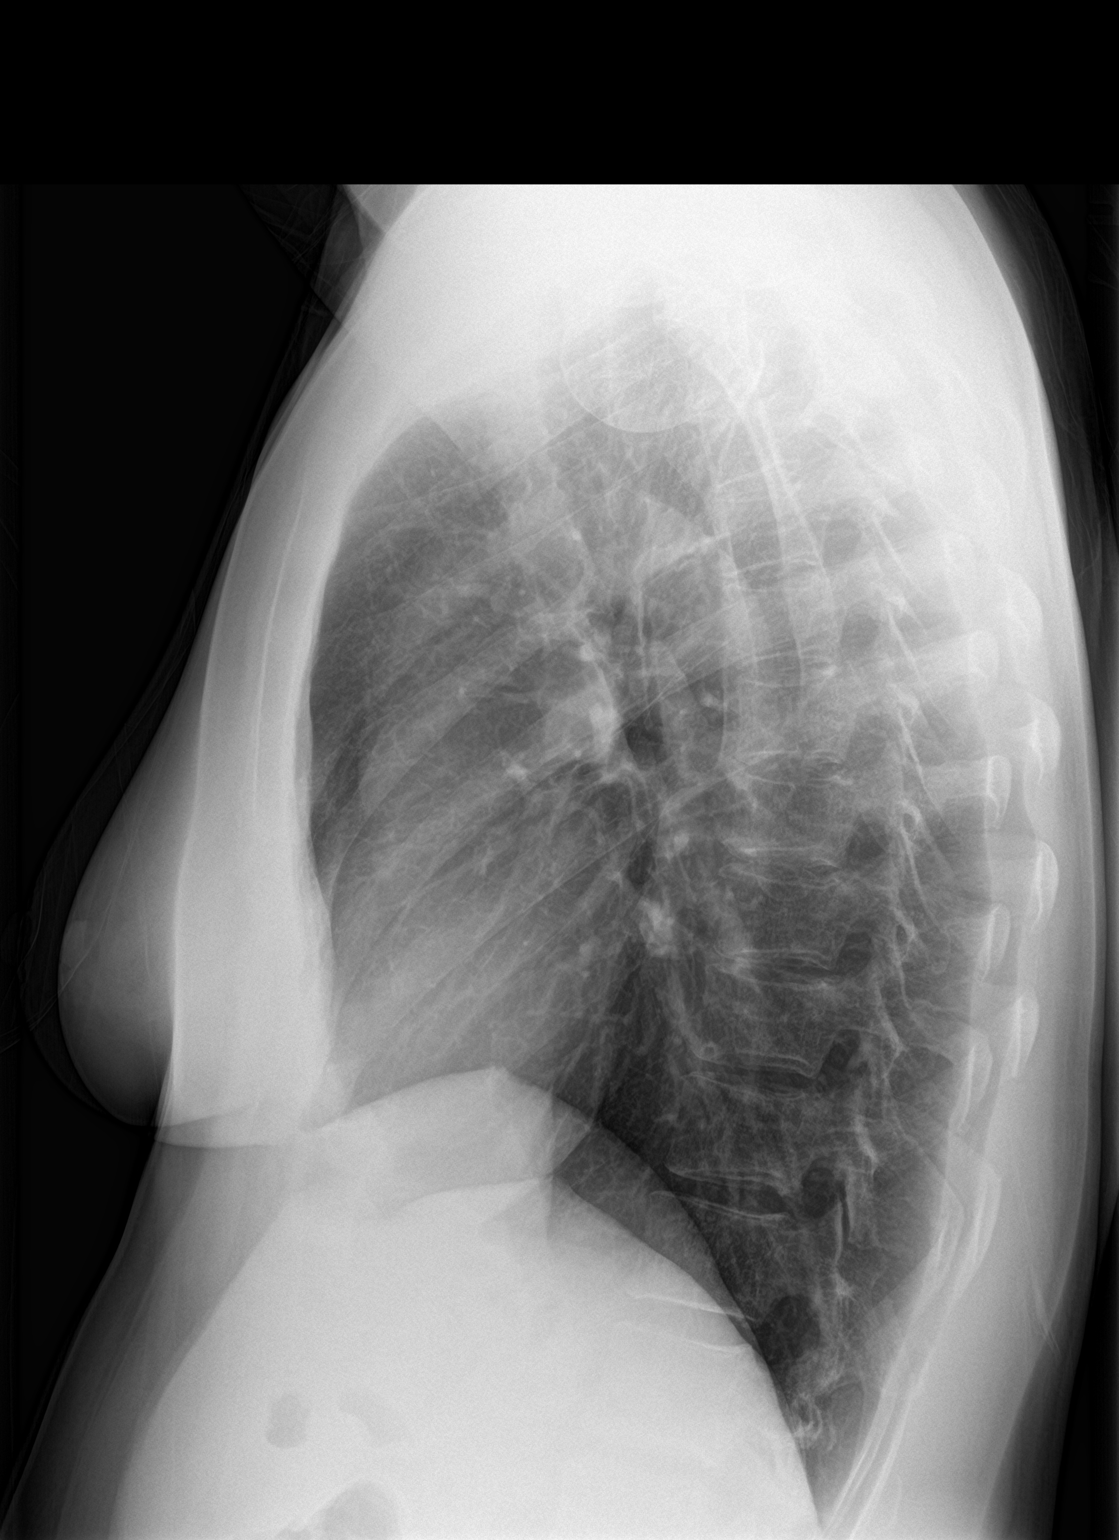

[2 of 2 positions shown; findings below may reference images not displayed]

FINDINGS: The lungs are well-expanded and clear. The heart and pulmonary
vascularity are normal. The mediastinum is normal in width. There is
no pleural effusion. The bony thorax is unremarkable.
IMPRESSION: The there is no active cardiopulmonary disease. Known lung nodules
demonstrated on previous CT studies and felt to be stable and benign
are not evident on this radiographic series.

## 2017-02-13 ENCOUNTER — Other Ambulatory Visit: Payer: Self-pay | Admitting: Internal Medicine

## 2017-03-04 ENCOUNTER — Other Ambulatory Visit: Payer: Self-pay | Admitting: Internal Medicine

## 2017-03-23 ENCOUNTER — Other Ambulatory Visit: Payer: Self-pay | Admitting: Internal Medicine

## 2017-03-23 MED ORDER — PREDNISONE 10 MG PO TABS: ORAL_TABLET | ORAL | 0 refills | Status: DC

## 2017-03-23 NOTE — Telephone Encounter (Signed)
Pt called asking for  Rx for prednisone. She states she has gotten into poison ivy again and states this is a thing that happens a couple times a year.  Please advise.  Last OV 01-12-2017   [facility] [address] [facility] [place]

## 2017-03-23 NOTE — Telephone Encounter (Signed)
I will send it in but she should be seen in the future for this.  rx pending.   - I do not see that pharmacy - ?

## 2017-03-23 NOTE — Telephone Encounter (Signed)
Spoke with pt to inform.  

## 2017-04-22 ENCOUNTER — Telehealth: Payer: Self-pay | Admitting: Internal Medicine

## 2017-04-22 ENCOUNTER — Encounter: Payer: Self-pay | Admitting: Internal Medicine

## 2017-04-22 MED ORDER — VALACYCLOVIR HCL 1 G PO TABS: 2000.0000 mg | ORAL_TABLET | Freq: Two times a day (BID) | ORAL | 5 refills | Status: DC | PRN

## 2017-04-22 NOTE — Telephone Encounter (Signed)
[place] controlled substance database checked.  Ok to fill medication.  Can you please date the prescriptions for three months

## 2017-04-22 NOTE — Telephone Encounter (Signed)
Pls advise if ok to refill../lmb 

## 2017-04-23 ENCOUNTER — Other Ambulatory Visit: Payer: Self-pay | Admitting: Emergency Medicine

## 2017-04-23 MED ORDER — AMPHETAMINE-DEXTROAMPHETAMINE 15 MG PO TABS: 15.0000 mg | ORAL_TABLET | Freq: Three times a day (TID) | ORAL | 0 refills | Status: DC

## 2017-04-23 NOTE — Telephone Encounter (Signed)
RXs printed. Pt notified she can pick up prescriptions Friday morning.
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
## 2017-04-23 NOTE — Telephone Encounter (Signed)
RXs printed. Pt advised to come in AM to get RXs

## 2017-06-29 ENCOUNTER — Other Ambulatory Visit: Payer: Self-pay | Admitting: Internal Medicine

## 2017-06-29 MED ORDER — AMPHETAMINE-DEXTROAMPHETAMINE 15 MG PO TABS: 15.0000 mg | ORAL_TABLET | Freq: Three times a day (TID) | ORAL | 0 refills | Status: DC

## 2017-06-29 NOTE — Telephone Encounter (Signed)
[place] Controlled Substance Database checked. Last filled on 05/28/17 

## 2017-06-29 NOTE — Telephone Encounter (Signed)
amphetamine-dextroamphetamine (ADDERALL) 15 MG tablet   [facility] [address]  [place], [place] [address] [phone]  Patient is requesting a refill on this medication. Please advise.

## 2017-07-06 ENCOUNTER — Other Ambulatory Visit: Payer: Self-pay | Admitting: Internal Medicine

## 2017-07-29 ENCOUNTER — Telehealth: Payer: Self-pay | Admitting: Internal Medicine

## 2017-07-29 MED ORDER — AMPHETAMINE-DEXTROAMPHETAMINE 15 MG PO TABS: 15.0000 mg | ORAL_TABLET | Freq: Three times a day (TID) | ORAL | 0 refills | Status: DC

## 2017-07-29 NOTE — Telephone Encounter (Signed)
Check  registry last filled 07/01/17.../lm,b

## 2017-07-29 NOTE — Telephone Encounter (Signed)
Copied from CRM [id number]. Topic: Quick Communication - See Telephone Encounter >> Jul 29, 2017  9:38 AM Windy KalataMichael, Tashanti Dalporto L, NT wrote: CRM for notification. See Telephone encounter for:  07/29/17.  Patient is calling for a refill on adderall 15mg . [facility]. Thank you

## 2017-07-29 NOTE — Telephone Encounter (Signed)
Notified pt [facility]../lmb 

## 2017-08-25 ENCOUNTER — Other Ambulatory Visit: Payer: Self-pay | Admitting: Internal Medicine

## 2017-09-07 ENCOUNTER — Telehealth: Payer: Self-pay | Admitting: Internal Medicine

## 2017-09-07 MED ORDER — AMPHETAMINE-DEXTROAMPHETAMINE 15 MG PO TABS: 15.0000 mg | ORAL_TABLET | Freq: Three times a day (TID) | ORAL | 0 refills | Status: DC

## 2017-09-07 NOTE — Telephone Encounter (Signed)
Called pt no answer LMOM rx sent to [facility].../lmb 

## 2017-09-07 NOTE — Telephone Encounter (Signed)
Copied from CRM [id number]. Topic: Quick Communication - Rx Refill/Question >> Sep 07, 2017 12:14 PM [name], Julia wrote: Medication:  amphetamine-dextroamphetamine (ADDERALL) 15 MG tablet    Has the patient contacted their pharmacy? Yes.     (Agent: If no, request that the patient contact the pharmacy for the refill.)   Preferred Pharmacy (with phone number or street name):   [id number] - [place], [place] - [address] [facility] [address] [place] [address] Phone: [phone] Fax: [phone]    Agent: Please be advised that RX refills may take up to 3 business days. We ask that you follow-up with your pharmacy.

## 2017-09-07 NOTE — Telephone Encounter (Signed)
Requesting refill of Adderall  LOV 01/12/17 with Dr. Lawerance BachBurns  [facility] 07/29/[facility]  #90   0 refills  [facility] [address] [place]  Ph [phone]    FAX [phone]

## 2017-10-02 ENCOUNTER — Other Ambulatory Visit: Payer: Self-pay | Admitting: Internal Medicine

## 2017-10-02 MED ORDER — AMPHETAMINE-DEXTROAMPHETAMINE 15 MG PO TABS: 15.0000 mg | ORAL_TABLET | Freq: Three times a day (TID) | ORAL | 0 refills | Status: DC

## 2017-10-02 NOTE — Telephone Encounter (Signed)
[place] Controlled Substance Database checked. Last filled on 09/10/17 

## 2017-10-02 NOTE — Telephone Encounter (Signed)
Copied from CRM [id number]. Topic: Quick Communication - Rx Refill/Question >> Oct 02, 2017  3:27 PM Arlyss Gandyichardson, Maxwel Meadowcroft N, NT wrote: Medication:  amphetamine-dextroamphetamine (ADDERALL)   Has the patient contacted their pharmacy? Yes.     (Agent: If no, request that the patient contact the pharmacy for the refill.)   [facility]erred [facility]with phone [facility] [address]   Agent: Please be advised that RX refills may take up to 3 business days. We ask that you follow-up with your pharmacy.

## 2017-10-02 NOTE — Telephone Encounter (Signed)
Adderall  LOV 01/12/17  Dr. Gwenevere AbbotBurns  RX. verified

## 2017-11-03 ENCOUNTER — Other Ambulatory Visit: Payer: Self-pay | Admitting: Internal Medicine

## 2017-11-03 MED ORDER — AMPHETAMINE-DEXTROAMPHETAMINE 15 MG PO TABS: 15.0000 mg | ORAL_TABLET | Freq: Three times a day (TID) | ORAL | 0 refills | Status: DC

## 2017-11-03 NOTE — Telephone Encounter (Signed)
Check [place] registry last filled 10/08/2017../lmb  

## 2017-11-14 ENCOUNTER — Other Ambulatory Visit: Payer: Self-pay | Admitting: Internal Medicine

## 2017-12-04 ENCOUNTER — Other Ambulatory Visit: Payer: Self-pay | Admitting: Internal Medicine

## 2017-12-04 MED ORDER — AMPHETAMINE-DEXTROAMPHETAMINE 15 MG PO TABS: 15.0000 mg | ORAL_TABLET | Freq: Three times a day (TID) | ORAL | 0 refills | Status: DC

## 2017-12-04 NOTE — Telephone Encounter (Signed)
MD out of office pls advise on refill. Check [place] registry last filled 11/05/2017.Marland Kitchen.Raechel Chute/lmb

## 2017-12-04 NOTE — Telephone Encounter (Signed)
Done erx 

## 2017-12-16 ENCOUNTER — Other Ambulatory Visit: Payer: Self-pay | Admitting: Internal Medicine

## 2017-12-17 ENCOUNTER — Encounter: Payer: Self-pay | Admitting: Internal Medicine

## 2017-12-17 NOTE — Telephone Encounter (Signed)
She should be seen.

## 2017-12-17 NOTE — Telephone Encounter (Signed)
Pt has not been seen since June 2018. Please advise.

## 2018-01-06 ENCOUNTER — Other Ambulatory Visit: Payer: Self-pay | Admitting: Internal Medicine

## 2018-01-06 MED ORDER — AMPHETAMINE-DEXTROAMPHETAMINE 15 MG PO TABS: 15.0000 mg | ORAL_TABLET | Freq: Three times a day (TID) | ORAL | 0 refills | Status: DC

## 2018-01-06 NOTE — Telephone Encounter (Signed)
12/06/2017 90# 

## 2018-02-09 ENCOUNTER — Other Ambulatory Visit: Payer: Self-pay | Admitting: Internal Medicine

## 2018-02-09 DIAGNOSIS — Z79890 Hormone replacement therapy (postmenopausal): Secondary | ICD-10-CM | POA: Insufficient documentation

## 2018-02-09 NOTE — Progress Notes (Signed)
Subjective:    Patient ID: [name], female    DOB: [birth year], 62 y.o.   MRN: [id number]  HPI The patient is here for follow up.  Hypertension: She is taking her verapamil daily, but she ran out of the maxide and has not taken that recently. She is compliant with a low sodium diet.  She denies chest pain, palpitations, edema, shortness of breath and regular headaches. She is exercising regularly.       ADD:  She is taking her medication as prescribed.  She feels the medication is effective.  She denies side effects, including palpitations, headaches, lightheadedness, decreased appetite and weight loss.   HRT:  She ran out of the estradiol and has been having some hot flushes.   She does want to continue the medication for now.    Left foot injury - dropped a board on her foot last week.  She wore a foot brace for the first several days.  The swelling and pain has improved.  She does not think she broke anything.  She can walk on it.  She has bruising.    Cough:  Her cough is left over from a few weeks ago.  Her granddaughter was also sick at that time and she thinks she got the same thing.  She coughs primarily at night when she lays down - she feels it is due to drainage.  She denies SOB, wheeze and fevers.    Medications and allergies reviewed with patient and updated if appropriate.  Patient Active Problem List   Diagnosis Date Noted  . Hormone replacement therapy (HRT) 02/09/2018  . Right calf pain 01/12/2017  . Incidental lung nodule 01/08/2012  . Eczema 01/08/2012  . Attention deficit disorder 04/14/2007  . Essential hypertension 12/09/2006  . ALLERGIC RHINITIS 12/09/2006  . GERD 12/09/2006  . IBS 12/09/2006    Current Outpatient Medications on File Prior to Visit  Medication Sig Dispense Refill  . amphetamine-dextroamphetamine (ADDERALL) 15 MG tablet Take 1 tablet by mouth 3 (three) times daily. 90 tablet 0  . estradiol (ESTRACE) 1 MG tablet Take 1 tablet (1 mg  total) by mouth daily. -- Office visit needed for further refills 90 tablet 0  . loratadine (CLARITIN) 10 MG tablet Take 10 mg by mouth daily as needed.    . Melatonin 3 MG CAPS Take 1 capsule by mouth at bedtime as needed.    Marland Kitchen omeprazole (PRILOSEC OTC) 20 MG tablet Take 20 mg by mouth daily as needed.      . valACYclovir (VALTREX) 1000 MG tablet Take 2 tablets (2,000 mg total) by mouth 2 (two) times daily as needed. 40 tablet 5  . verapamil (CALAN) 120 MG tablet TAKE 1 TABLET BY MOUTH 3 TIMES A DAY 270 tablet 1  . triamterene-hydrochlorothiazide (MAXZIDE-25) 37.5-25 MG tablet Take 1 tablet by mouth daily. -- Office visit needed for further refills (Patient not taking: Reported on 02/10/2018) 90 tablet 0   No current facility-administered medications on file prior to visit.     Past Medical History:  Diagnosis Date  . ADD   . ALLERGIC RHINITIS   . GERD   . HOMOCYSTINEMIA   . HYPERTENSION   . IBS   . Incidental lung nodule 02/2011 ct   unchanged 07/16/11 CT, 6 mo f/u  planned    Past Surgical History:  Procedure Laterality Date  . ABDOMINAL HYSTERECTOMY    . Bladder tack  2001    Social History  Socioeconomic History  . Marital status: Married    Spouse name: Not on file  . Number of children: 3  . Years of education: Not on file  . Highest education level: Not on file  Occupational History  . Occupation: Investment banker, corporatesales    Employer: [employer]  Social Needs  . Financial resource strain: Not on file  . Food insecurity:    Worry: Not on file    Inability: Not on file  . Transportation needs:    Medical: Not on file    Non-medical: Not on file  Tobacco Use  . Smoking status: Former Smoker    Types: Cigarettes    Start date: 08/11/1976  . Smokeless tobacco: Never Used  . Tobacco comment: Single-occassional boyfriend. 3 children-yougest son with cystic fibrosis. currently unemployed since 01/2010. 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 Alcohol use: Yes    Comment: a glass of winw a night  . Drug use: No  . Sexual activity: Not on file  Lifestyle  . Physical activity:    Days per week: Not on file    Minutes per session: Not on file  . Stress: Not on file  Relationships  . Social connections:    Talks on phone: Not on file    Gets together: Not on file    Attends religious service: Not on file    Active member of club or organization: Not on file    Attends meetings of clubs or organizations: Not on file    Relationship status: Not on file  Other Topics Concern  . Not on file  Social History Narrative  . Not on file    Family History  Problem Relation Age of Onset  . Coronary artery disease Mother   . Hypertension Mother   . Heart failure Mother   . Parkinson's disease Mother   . Coronary artery disease Father   . Hypertension Father   . Diabetes Father   . Stroke Father   . Coronary artery disease Maternal Aunt   . Stroke Maternal Aunt   . Parkinson's disease Maternal Aunt   . Stroke Maternal Uncle   . Stroke Paternal Uncle   . Hypertension Maternal Grandmother   . Hypertension Maternal Grandfather   . Colon cancer Maternal Grandfather   . Hypertension Paternal Grandmother   . Hypertension Paternal Grandfather   . Colon cancer Paternal Grandfather   . Colon cancer Cousin     Review of Systems  Constitutional: Negative for chills and fever.  Respiratory: Positive for cough (dry). Negative for shortness of breath and wheezing.   Cardiovascular: Negative for chest pain, palpitations and leg swelling.  Neurological: Negative for light-headedness and headaches.       Objective:   Vitals:   02/10/18 1021  BP: 136/90  Pulse: 76  Temp: 97.7 F (36.5 C)  SpO2: 99%   BP Readings from Last 3 Encounters:  02/10/18 136/90  01/12/17 128/88  08/22/15 124/86   Wt Readings from Last 3 Encounters:  02/10/18 158 lb (71.7 kg)  01/12/17 153 lb (69.4 kg)  08/22/15 158 lb (71.7 kg)   Body  mass index is 25.5 kg/m.   Physical Exam    Constitutional: Appears well-developed and well-nourished. No distress.  HENT:  Head: Normocephalic and atraumatic.  Neck: Neck supple. No tracheal deviation present. No thyromegaly present.  No cervical lymphadenopathy Cardiovascular: Normal rate, regular rhythm and normal heart sounds.   No  murmur heard. No carotid bruit .  No edema Pulmonary/Chest: Effort normal and breath sounds normal. No respiratory distress. No has no wheezes. No rales.  Msk:  Left foot with swelling and tenderness distal first metatarsal, bruising in distal and proximal part of foot. No other swelling. Skin: Skin is warm and dry. Not diaphoretic.  Psychiatric: Normal mood and affect. Behavior is normal.      Assessment & Plan:    See Problem List for Assessment and Plan of chronic medical problems.

## 2018-02-10 ENCOUNTER — Encounter: Payer: Self-pay | Admitting: Internal Medicine

## 2018-02-10 ENCOUNTER — Other Ambulatory Visit (INDEPENDENT_AMBULATORY_CARE_PROVIDER_SITE_OTHER): Payer: Self-pay

## 2018-02-10 ENCOUNTER — Ambulatory Visit: Payer: Self-pay | Admitting: Internal Medicine

## 2018-02-10 ENCOUNTER — Telehealth: Payer: Self-pay | Admitting: Internal Medicine

## 2018-02-10 VITALS — BP 136/90 | HR 76 | Temp 97.7°F | Wt 158.0 lb

## 2018-02-10 DIAGNOSIS — R05 Cough: Secondary | ICD-10-CM

## 2018-02-10 DIAGNOSIS — Z79890 Hormone replacement therapy (postmenopausal): Secondary | ICD-10-CM

## 2018-02-10 DIAGNOSIS — S99922A Unspecified injury of left foot, initial encounter: Secondary | ICD-10-CM

## 2018-02-10 DIAGNOSIS — F988 Other specified behavioral and emotional disorders with onset usually occurring in childhood and adolescence: Secondary | ICD-10-CM

## 2018-02-10 DIAGNOSIS — I10 Essential (primary) hypertension: Secondary | ICD-10-CM

## 2018-02-10 DIAGNOSIS — R059 Cough, unspecified: Secondary | ICD-10-CM | POA: Insufficient documentation

## 2018-02-10 LAB — COMPREHENSIVE METABOLIC PANEL
ALT: 28 U/L (ref 0–35)
AST: 27 U/L (ref 0–37)
Albumin: 4.3 g/dL (ref 3.5–5.2)
Alkaline Phosphatase: 69 U/L (ref 39–117)
BUN: 16 mg/dL (ref 6–23)
CO2: 31 meq/L (ref 19–32)
Calcium: 9.2 mg/dL (ref 8.4–10.5)
Chloride: 100 mEq/L (ref 96–112)
Creatinine, Ser: 0.66 mg/dL (ref 0.40–1.20)
GFR: 117.44 mL/min (ref >60.00)
GLUCOSE: 87 mg/dL (ref 70–99)
POTASSIUM: 3.5 meq/L (ref 3.5–5.1)
Sodium: 139 mEq/L (ref 135–145)
Total Bilirubin: 0.3 mg/dL (ref 0.2–1.2)
Total Protein: 7 g/dL (ref 6.0–8.3)

## 2018-02-10 MED ORDER — TRIAMTERENE-HCTZ 37.5-25 MG PO TABS: 1.0000 | ORAL_TABLET | Freq: Every day | ORAL | 1 refills | Status: DC

## 2018-02-10 MED ORDER — ESTRADIOL 1 MG PO TABS: 1.0000 mg | ORAL_TABLET | Freq: Every day | ORAL | 1 refills | Status: DC

## 2018-02-10 MED ORDER — AMPHETAMINE-DEXTROAMPHETAMINE 15 MG PO TABS: 15.0000 mg | ORAL_TABLET | Freq: Three times a day (TID) | ORAL | 0 refills | Status: DC

## 2018-02-10 MED ORDER — VERAPAMIL HCL 120 MG PO TABS: 120.0000 mg | ORAL_TABLET | Freq: Three times a day (TID) | ORAL | 1 refills | Status: DC

## 2018-02-10 NOTE — Patient Instructions (Addendum)

## 2018-02-10 NOTE — Telephone Encounter (Signed)
Pt saw MD this am med was refilled and sent to [facility].Marland Kitchen.Raechel Chute/lmb

## 2018-02-10 NOTE — Assessment & Plan Note (Signed)
Controlled, stable Continue current dose of medication Follow-up in 6 months

## 2018-02-10 NOTE — Telephone Encounter (Signed)
Check [place] registry last filled 01/06/2018../lmb  

## 2018-02-10 NOTE — Telephone Encounter (Signed)
Copied from CRM [id number]. Topic: Quick Communication - See Telephone Encounter >> Feb 10, 2018 12:53 PM Windy KalataMichael, Jeshurun Oaxaca L, NT wrote: CRM for notification. See Telephone encounter for: 02/10/18.  Patient is calling and states she went to pick up amphetamine-dextroamphetamine (ADDERALL) 15 MG tablet and they are out of this medication. She would like this sent to a different pharmacy provided below.  [id number] - [place], [place] - [address] AT [facility] [address] [place] [address] Phone: [phone] Fax: [phone]

## 2018-02-10 NOTE — Assessment & Plan Note (Signed)
Dry cough from postnasal drip Discussed symptomatic treatment No concerning symptoms to think she has an active infection or needs further evaluation

## 2018-02-10 NOTE — Assessment & Plan Note (Signed)
Has run out of medication and she has been having hot flushes Would like to continue medication-okay at this point, but discussed that eventually we will need to discontinue the medication around age 62 because of the risks will start to outweigh the benefits Restart estradiol 1 mg daily-advised that she can try taking half a pill daily as well to keep the estrogen at a minimal level

## 2018-02-10 NOTE — Telephone Encounter (Signed)
Sent to pharmacy this morning.

## 2018-02-10 NOTE — Assessment & Plan Note (Signed)
A very heavy board fell on her left foot proximally 1 week ago.  She did wear a foot brace/boot for several days, which did help She does have some swelling and pain, but it has improved and she is able to walk on it Bruising improving She does not think she broke anything and does not feel that she needs further evaluation at this time

## 2018-02-10 NOTE — Assessment & Plan Note (Signed)
Slightly elevated here today, but has not been taking her one medication Restart Maxide and continue verapamil CMP

## 2018-02-12 ENCOUNTER — Encounter: Payer: Self-pay | Admitting: Internal Medicine

## 2018-02-16 ENCOUNTER — Telehealth: Payer: Self-pay | Admitting: Internal Medicine

## 2018-02-16 MED ORDER — AMPHETAMINE-DEXTROAMPHETAMINE 15 MG PO TABS: 15.0000 mg | ORAL_TABLET | Freq: Three times a day (TID) | ORAL | 0 refills | Status: DC

## 2018-02-16 NOTE — Telephone Encounter (Signed)
[place] Controlled Substance Database checked. Last filled on 01/06/18. RX from 02/10/18 has not been filled.

## 2018-02-16 NOTE — Telephone Encounter (Signed)
LVM informing pt

## 2018-02-16 NOTE — Telephone Encounter (Signed)
Yes, ok to delete.  [facility] [address] [facility]

## 2018-02-16 NOTE — Telephone Encounter (Signed)
Copied from CRM [id number]. Topic: Quick Communication - See Telephone Encounter >> Feb 16, 2018 10:28 AM Windy KalataMichael, Gem Conkle L, NT wrote: CRM for notification. See Telephone encounter for: 02/16/18.  [facility] [address] [facility] and states they received prescription for amphetamine-dextroamphetamine (ADDERALL) 15 MG tablet but states they will not have it in stock for awhile. She states [facility] [address] [facility] and wants to know can this one be deleted and we resend it to [address]. Please advise  Fax# [phone]  Phone# [phone]  [id number] [place] [facility] - [address] [place] [address] Phone: [phone] Fax: [phone]

## 2018-03-16 ENCOUNTER — Other Ambulatory Visit: Payer: Self-pay | Admitting: Internal Medicine

## 2018-03-16 MED ORDER — AMPHETAMINE-DEXTROAMPHETAMINE 15 MG PO TABS: 15.0000 mg | ORAL_TABLET | Freq: Three times a day (TID) | ORAL | 0 refills | Status: DC

## 2018-03-16 NOTE — Telephone Encounter (Signed)
Last refill was 02/16/2018 per database #90.

## 2018-04-18 ENCOUNTER — Other Ambulatory Visit: Payer: Self-pay | Admitting: Internal Medicine

## 2018-04-18 ENCOUNTER — Encounter: Payer: Self-pay | Admitting: Internal Medicine

## 2018-04-19 MED ORDER — AMPHETAMINE-DEXTROAMPHETAMINE 15 MG PO TABS: 15.0000 mg | ORAL_TABLET | Freq: Three times a day (TID) | ORAL | 0 refills | Status: DC

## 2018-04-19 NOTE — Telephone Encounter (Signed)
Last refill was 03/16/18 per database.  Last OV was 02/10/18 No office visit made for follow up

## 2018-04-20 ENCOUNTER — Telehealth: Payer: Self-pay | Admitting: Internal Medicine

## 2018-04-20 MED ORDER — AMPHETAMINE-DEXTROAMPHETAMINE 15 MG PO TABS: 15.0000 mg | ORAL_TABLET | Freq: Three times a day (TID) | ORAL | 0 refills | Status: DC

## 2018-04-20 NOTE — Telephone Encounter (Signed)
Copied from CRM [id number]. Topic: Quick Communication - See Telephone Encounter >> Apr 20, 2018 10:16 AM Jolayne Haines L wrote: CRM for notification. See Telephone encounter for: 04/20/18.  Patient [facility]-dextroamphetamine (ADDERALL) 15 MG tablet, they only have one bottle left. [facility] [place] [facility]. Can that be sent to [facility] in [place] ?

## 2018-04-20 NOTE — Telephone Encounter (Signed)
Check [place] registry last filled 03/16/2018.Marland KitchenRaechel Chute

## 2018-04-20 NOTE — Telephone Encounter (Signed)
Sent to [facility]  

## 2018-04-20 NOTE — Telephone Encounter (Signed)
MD approved and sent electronically to [facility]../lmb  

## 2018-04-28 NOTE — Telephone Encounter (Signed)
Please advise 

## 2018-04-28 NOTE — Telephone Encounter (Signed)
Ok - do they want the rx send now or in two weeks?

## 2018-04-28 NOTE — Telephone Encounter (Signed)
[name] with [facility] calling and states that they only have enough medication on the shelf to fill the prescription for 40 tablets. Would like to know if Dr Lawerance BachBurns is okay filling it for the 40 tablets and then sending another prescription for the remaining 50 tablets for the pharmacy to fill in a couple weeks when they get more adderall?? When filling for a partial script, the system voids out the remaining quantity. Would need new rx. CB#: [phone]

## 2018-04-29 NOTE — Telephone Encounter (Signed)
Spoke with pharmacy and they stated to hold off on script for now until they know they can get more in. Pt might have to use another pharmacy for future refills until they are able to get it in.

## 2018-05-10 MED ORDER — AMPHETAMINE-DEXTROAMPHETAMINE 15 MG PO TABS: 15.0000 mg | ORAL_TABLET | Freq: Three times a day (TID) | ORAL | 0 refills | Status: DC

## 2018-05-10 NOTE — Telephone Encounter (Signed)
Pt only received #30 on 04-20-18. That was all that [facility] [address] had. Pt would like refill #270 for 90 day supply. [facility] [address] does have that qty in stock

## 2018-05-10 NOTE — Addendum Note (Signed)
Addended by: Pincus Sanes on: 05/10/2018 04:33 PM   Modules accepted: Orders

## 2018-05-11 NOTE — Telephone Encounter (Signed)
Pt called stating [facility] only has 90 doses (30 day supply) of adderall. She wants chart to be noted so that when she needs medication next month we don't tell her it's too soon. Please update med refill from 05/10/18.

## 2018-05-13 NOTE — Telephone Encounter (Signed)
FYI

## 2018-05-13 NOTE — Telephone Encounter (Signed)
noted 

## 2018-06-07 ENCOUNTER — Telehealth: Payer: Self-pay | Admitting: Internal Medicine

## 2018-06-07 NOTE — Telephone Encounter (Signed)
Copied from CRM [id number]. Topic: Quick Communication - Rx Refill/Question >> Jun 07, 2018  4:23 PM Jens Som A wrote: Medication: amphetamine-dextroamphetamine (ADDERALL) 15 MG tablet [[id number]] -Previously pharmacy filled it for 30days. There was a Geneticist, molecular.  Has the patient contacted their pharmacy? Yes  (Agent: If no, request that the patient contact the pharmacy for the refill.) (Agent: If yes, when and what did the pharmacy advise?)  Preferred Pharmacy (with phone number or street name): [facility] [address] [place] - [address] [place] [address] Phone: [phone] Fax: [phone]    Agent: Please be advised that RX refills may take up to 3 business days. We ask that you follow-up with your pharmacy.

## 2018-06-08 MED ORDER — AMPHETAMINE-DEXTROAMPHETAMINE 15 MG PO TABS: 15.0000 mg | ORAL_TABLET | Freq: Three times a day (TID) | ORAL | 0 refills | Status: DC

## 2018-06-08 NOTE — Telephone Encounter (Signed)
MD approved and sent electronically to [facility]../lmb  

## 2018-06-08 NOTE — Telephone Encounter (Signed)
Check [place] registry last filled 05/11/2018../lmb  

## 2018-06-27 ENCOUNTER — Other Ambulatory Visit: Payer: Self-pay | Admitting: Internal Medicine

## 2018-08-19 ENCOUNTER — Other Ambulatory Visit: Payer: Self-pay | Admitting: Internal Medicine

## 2018-09-03 ENCOUNTER — Other Ambulatory Visit: Payer: Self-pay | Admitting: Internal Medicine

## 2018-09-03 MED ORDER — AMPHETAMINE-DEXTROAMPHETAMINE 15 MG PO TABS
15.0000 mg | ORAL_TABLET | Freq: Three times a day (TID) | ORAL | 0 refills | Status: DC
Start: 2018-09-03 — End: 2018-09-06

## 2018-09-03 NOTE — Telephone Encounter (Signed)
Check [place] registry last filled 06/08/2018../lmb  

## 2018-09-05 ENCOUNTER — Encounter: Payer: Self-pay | Admitting: Internal Medicine

## 2018-09-06 ENCOUNTER — Telehealth: Payer: Self-pay | Admitting: Internal Medicine

## 2018-09-06 MED ORDER — AMPHETAMINE-DEXTROAMPHETAMINE 15 MG PO TABS: 15.0000 mg | ORAL_TABLET | Freq: Three times a day (TID) | ORAL | 0 refills | Status: DC

## 2018-09-06 NOTE — Telephone Encounter (Signed)
Pt states that [facility] did not receive ad[facility] that was sent on 09/03/18. Will you please resend medication.

## 2018-09-06 NOTE — Telephone Encounter (Signed)
Medication sent to pharmacy on 09/06/18 at 11:44 am.

## 2018-09-06 NOTE — Addendum Note (Signed)
Addended by: Pincus Sanes on: 09/06/2018 11:44 AM   Modules accepted: Orders

## 2018-09-06 NOTE — Telephone Encounter (Signed)
Copied from CRM [id number]. Topic: Quick Communication - Rx Refill/Question >> Sep 06, 2018 11:03 AM Percival Spanish wrote: Medication   amphetamine-dextroamphetamine (ADDERALL) 15 MG tablet      pharmacy said they never received the RX that sent in on Friday  09/03/2018  Has the patient contacted their pharmacy yes     Preferred Pharmacy    [facility] [address]   Agent: Please be advised that RX refills may take up to 3 business days. We ask that you follow-up with your pharmacy.

## 2018-09-16 ENCOUNTER — Other Ambulatory Visit: Payer: Self-pay | Admitting: Internal Medicine

## 2018-09-20 NOTE — Progress Notes (Signed)
Subjective:    Patient ID: [name], female    DOB: [birth year], 63 y.o.   MRN: [id number]  HPI The patient is here for follow up.  Hypertension: She is taking her medication daily. She is compliant with a low sodium diet.  She denies chest pain, palpitations, edema, shortness of breath and regular headaches. She is exercising regularly.  She does not monitor her blood pressure at home.    ADD:  She is taking her medication as prescribed.  She feels the medication is effective.  She denies side effects, including palpitations, headaches, lightheadedness, decreased appetite and weight loss.    HRT:  She is taking the estradiol daily.  She is happy with her current dose and wants to continue.    Left index finger:  She cut the end of her finger 3 days ago.  It did bleed a lot when it happened.  She states that most likely she needed to get sutures because it is a wider laceration.  The tip of her finger is a minimum, which is new.  There is no redness or pain.  There is no discharge from the laceration.  She is keeping it covered with a Band-Aid.  She punctured her right ring finger the other day as well.  It also bled a lot.  There is some mild redness.  She has been soaking it.  There is been no discharge.  It is slightly tender.  She hit her right anterior leg approximately 1 week ago.  The area did not bruise up, but it looks like a deeper bruise.  It is still extremely painful.  She has been putting lidocaine patches on the area, especially at night so she can sleep.  Medications and allergies reviewed with patient and updated if appropriate.  Patient Active Problem List   Diagnosis Date Noted  . Foot injury, left, initial encounter 02/10/2018  . Cough 02/10/2018  . Hormone replacement therapy (HRT) 02/09/2018  . Incidental lung nodule 01/08/2012  . Eczema 01/08/2012  . Attention deficit disorder 04/14/2007  . Essential hypertension 12/09/2006  . ALLERGIC RHINITIS  12/09/2006  . GERD 12/09/2006  . IBS 12/09/2006    Current Outpatient Medications on File Prior to Visit  Medication Sig Dispense Refill  . amphetamine-dextroamphetamine (ADDERALL) 15 MG tablet Take 1 tablet by mouth 3 (three) times daily. 270 tablet 0  . loratadine (CLARITIN) 10 MG tablet Take 10 mg by mouth daily as needed.     No current facility-administered medications on file prior to visit.     Past Medical History:  Diagnosis Date  . ADD   . ALLERGIC RHINITIS   . GERD   . HOMOCYSTINEMIA   . HYPERTENSION   . IBS   . Incidental lung nodule 02/2011 ct   unchanged 07/16/11 CT, 6 mo f/u  planned    Past Surgical History:  Procedure Laterality Date  . ABDOMINAL HYSTERECTOMY    . Bladder tack  2001    Social History   Socioeconomic History  . Marital status: Married    Spouse name: Not on file  . Number of children: 3  . Years of education: Not on file  . Highest education level: Not on file  Occupational History  . Occupation: Investment banker, corporate: [employer]  Social Needs  . Financial resource strain: Not on file  . Food insecurity:    Worry: Not on file    Inability: Not on file  . Transportation  needs:    Medical: Not on file    Non-medical: Not on file  Tobacco Use  . Smoking status: Former Smoker    Types: Cigarettes    Start date: 08/11/1976  . Smokeless tobacco: Never Used  . Tobacco comment: Single-occassional boyfriend. 3 children-yougest son with cystic fibrosis. currently unemployed since 01/2010. 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 Alcohol use: Yes    Comment: a glass of winw a night  . Drug use: No  . Sexual activity: Not on file  Lifestyle  . Physical activity:    Days per week: Not on file    Minutes per session: Not on file  . Stress: Not on file  Relationships  . Social connections:    Talks on phone: Not on file    Gets together: Not on file    Attends religious service: Not on file      Active member of club or organization: Not on file    Attends meetings of clubs or organizations: Not on file    Relationship status: Not on file  Other Topics Concern  . Not on file  Social History Narrative  . Not on file    Family History  Problem Relation Age of Onset  . Coronary artery disease Mother   . Hypertension Mother   . Heart failure Mother   . Parkinson's disease Mother   . Coronary artery disease Father   . Hypertension Father   . Diabetes Father   . Stroke Father   . Coronary artery disease Maternal Aunt   . Stroke Maternal Aunt   . Parkinson's disease Maternal Aunt   . Stroke Maternal Uncle   . Stroke Paternal Uncle   . Hypertension Maternal Grandmother   . Hypertension Maternal Grandfather   . Colon cancer Maternal Grandfather   . Hypertension Paternal Grandmother   . Hypertension Paternal Grandfather   . Colon cancer Paternal Grandfather   . Colon cancer Cousin     Review of Systems  Constitutional: Negative for appetite change, chills, fever and unexpected weight change.  Cardiovascular: Negative for chest pain and palpitations.  Neurological: Negative for light-headedness and headaches.       Objective:   Vitals:   09/21/18 0804  BP: 128/80  Pulse: 90  Resp: 16  Temp: 97.7 F (36.5 C)  SpO2: 97%   BP Readings from Last 3 Encounters:  09/21/18 128/80  02/10/18 136/90  01/12/17 128/88   Wt Readings from Last 3 Encounters:  09/21/18 159 lb 3.2 oz (72.2 kg)  02/10/18 158 lb (71.7 kg)  01/12/17 153 lb (69.4 kg)   Body mass index is 25.7 kg/m.   Physical Exam    Constitutional: Appears well-developed and well-nourished. No distress.  HENT:  Head: Normocephalic and atraumatic.  Neck: Neck supple. No tracheal deviation present. No thyromegaly present.  No cervical lymphadenopathy Cardiovascular: Normal rate, regular rhythm and normal heart sounds.   No murmur heard. No carotid bruit .  No edema Pulmonary/Chest: Effort normal and  breath sounds normal. No respiratory distress. No has no wheezes. No rales.  Skin, Msk: Skin is warm and dry. Not diaphoretic.  Laceration left index finger just distal of DIP on anterior aspect.  No surrounding erythema.  No discharge.  Tip of finger has decreased sensation.  Right ring finger with mild erythema and swelling and posterior medial surface of middle.  No active discharge, minimal tenderness.  Right anterior lower leg with  bruise, no open wound, area tender Psychiatric: Normal mood and affect. Behavior is normal.      Assessment & Plan:    See Problem List for Assessment and Plan of chronic medical problems.

## 2018-09-20 NOTE — Patient Instructions (Addendum)
  Tests ordered today. Your results will be released to MyChart (or called to you) after review, usually within 72hours after test completion. If any changes need to be made, you will be notified at that same time.  Flu immunization administered today.   Medications reviewed and updated.  Changes include :   none  Your prescription(s) have been submitted to your pharmacy. Please take as directed and contact our office if you believe you are having problem(s) with the medication(s).   Please followup in 6 months   

## 2018-09-21 ENCOUNTER — Encounter: Payer: Self-pay | Admitting: Internal Medicine

## 2018-09-21 ENCOUNTER — Ambulatory Visit (INDEPENDENT_AMBULATORY_CARE_PROVIDER_SITE_OTHER): Payer: Self-pay | Admitting: Internal Medicine

## 2018-09-21 ENCOUNTER — Other Ambulatory Visit (INDEPENDENT_AMBULATORY_CARE_PROVIDER_SITE_OTHER): Payer: Self-pay

## 2018-09-21 VITALS — BP 128/80 | HR 90 | Temp 97.7°F | Resp 16 | Ht 66.0 in | Wt 159.2 lb

## 2018-09-21 DIAGNOSIS — Z79890 Hormone replacement therapy (postmenopausal): Secondary | ICD-10-CM

## 2018-09-21 DIAGNOSIS — S61239A Puncture wound without foreign body of unspecified finger without damage to nail, initial encounter: Secondary | ICD-10-CM

## 2018-09-21 DIAGNOSIS — I10 Essential (primary) hypertension: Secondary | ICD-10-CM

## 2018-09-21 DIAGNOSIS — F988 Other specified behavioral and emotional disorders with onset usually occurring in childhood and adolescence: Secondary | ICD-10-CM

## 2018-09-21 DIAGNOSIS — S61211A Laceration without foreign body of left index finger without damage to nail, initial encounter: Secondary | ICD-10-CM

## 2018-09-21 DIAGNOSIS — Z23 Encounter for immunization: Secondary | ICD-10-CM | POA: Insufficient documentation

## 2018-09-21 LAB — COMPREHENSIVE METABOLIC PANEL
ALT: 22 U/L (ref 0–35)
AST: 23 U/L (ref 0–37)
Albumin: 4.3 g/dL (ref 3.5–5.2)
Alkaline Phosphatase: 63 U/L (ref 39–117)
BUN: 12 mg/dL (ref 6–23)
CO2: 30 mEq/L (ref 19–32)
Calcium: 8.9 mg/dL (ref 8.4–10.5)
Chloride: 103 mEq/L (ref 96–112)
Creatinine, Ser: 0.72 mg/dL (ref 0.40–1.20)
GFR: 99.74 mL/min (ref >60.00)
Glucose, Bld: 82 mg/dL (ref 70–99)
Potassium: 3.9 mEq/L (ref 3.5–5.1)
SODIUM: 140 meq/L (ref 135–145)
Total Bilirubin: 0.3 mg/dL (ref 0.2–1.2)
Total Protein: 6.7 g/dL (ref 6.0–8.3)

## 2018-09-21 LAB — CBC WITH DIFFERENTIAL/PLATELET
BASOS ABS: 0 10*3/uL (ref 0.0–0.1)
Basophils Relative: 0.5 % (ref 0.0–3.0)
Eosinophils Absolute: 0.1 10*3/uL (ref 0.0–0.7)
Eosinophils Relative: 1.9 % (ref 0.0–5.0)
HCT: 41.2 % (ref 36.0–46.0)
Hemoglobin: 14.2 g/dL (ref 12.0–15.0)
Lymphocytes Relative: 21.6 % (ref 12.0–46.0)
Lymphs Abs: 1.1 10*3/uL (ref 0.7–4.0)
MCHC: 34.3 g/dL (ref 30.0–36.0)
MCV: 96.3 fl (ref 78.0–100.0)
Monocytes Absolute: 0.5 10*3/uL (ref 0.1–1.0)
Monocytes Relative: 9.4 % (ref 3.0–12.0)
NEUTROS PCT: 66.6 % (ref 43.0–77.0)
Neutro Abs: 3.4 10*3/uL (ref 1.4–7.7)
Platelets: 253 10*3/uL (ref 150.0–400.0)
RBC: 4.28 Mil/uL (ref 3.87–5.11)
RDW: 13.3 % (ref 11.5–15.5)
WBC: 5.2 10*3/uL (ref 4.0–10.5)

## 2018-09-21 MED ORDER — VALACYCLOVIR HCL 1 G PO TABS: 2000.0000 mg | ORAL_TABLET | Freq: Two times a day (BID) | ORAL | 2 refills | Status: DC | PRN

## 2018-09-21 MED ORDER — VERAPAMIL HCL 120 MG PO TABS: 120.0000 mg | ORAL_TABLET | Freq: Three times a day (TID) | ORAL | 1 refills | Status: DC

## 2018-09-21 MED ORDER — ESTRADIOL 1 MG PO TABS: 1.0000 mg | ORAL_TABLET | Freq: Every day | ORAL | 1 refills | Status: DC

## 2018-09-21 MED ORDER — TRIAMTERENE-HCTZ 37.5-25 MG PO TABS: ORAL_TABLET | ORAL | 1 refills | Status: DC

## 2018-09-21 NOTE — Assessment & Plan Note (Signed)
Small laceration left index finger without foreign body or evidence of infection.  She does have some numbness in her fingertip, which she plans to just monitor Continue antibacterial ointment and keeping the area covered If numbness does not improve or she has symptoms of infection she will call

## 2018-09-21 NOTE — Assessment & Plan Note (Signed)
Stable, controlled Continue current dose of Adderall

## 2018-09-21 NOTE — Assessment & Plan Note (Signed)
Puncture wound of right ring finger Mild redness, swelling and tenderness She is soaking the finger She can apply topical antibacterial ointment If symptoms do not improve she will call and we will consider an oral antibiotic

## 2018-09-21 NOTE — Assessment & Plan Note (Addendum)
BP well controlled Current regimen effective and well tolerated Continue current medications at current doses CMP, CBC 

## 2018-09-21 NOTE — Assessment & Plan Note (Signed)
Doing well with her current dose of estradiol 1 mg daily She would like to continue We will refill

## 2018-09-22 ENCOUNTER — Encounter: Payer: Self-pay | Admitting: Internal Medicine

## 2018-11-06 ENCOUNTER — Other Ambulatory Visit: Payer: Self-pay | Admitting: Internal Medicine

## 2018-11-08 ENCOUNTER — Encounter: Payer: Self-pay | Admitting: Internal Medicine

## 2018-11-08 MED ORDER — AMPHETAMINE-DEXTROAMPHETAMINE 15 MG PO TABS: 15.0000 mg | ORAL_TABLET | Freq: Three times a day (TID) | ORAL | 0 refills | Status: DC

## 2018-11-08 NOTE — Telephone Encounter (Signed)
Check [place] registry last filled 09/06/2018.Marland KitchenRaechel Duncan

## 2018-12-15 ENCOUNTER — Telehealth: Payer: Self-pay | Admitting: *Deleted

## 2018-12-15 NOTE — Telephone Encounter (Signed)
Entered in error

## 2019-02-12 ENCOUNTER — Telehealth: Payer: Self-pay | Admitting: Family

## 2019-02-12 DIAGNOSIS — H01114 Allergic dermatitis of left upper eyelid: Secondary | ICD-10-CM

## 2019-02-12 MED ORDER — PREDNISONE 10 MG (21) PO TBPK: ORAL_TABLET | ORAL | 0 refills | Status: DC

## 2019-02-12 NOTE — Progress Notes (Signed)
E Visit for Rash  We are sorry that you are not feeling well. Here is how we plan to help!  Based on what you shared with me it looks like you have contact dermatitis.  Contact dermatitis is a skin rash caused by something that touches the skin and causes irritation or inflammation.  Your skin may be red, swollen, dry, cracked, and itch.  The rash should go away in a few days but can last a few weeks.  If you get a rash, it's important to figure out what caused it so the irritant can be avoided in the future.   Prednisone 5 mg daily for 6 days (see taper instructions below)  Directions for 6 day taper: Day 1: 2 tablets before breakfast, 1 after both lunch & dinner and 2 at bedtime Day 2: 1 tab before breakfast, 1 after both lunch & dinner and 2 at bedtime Day 3: 1 tab at each meal & 1 at bedtime Day 4: 1 tab at breakfast, 1 at lunch, 1 at bedtime Day 5: 1 tab at breakfast & 1 tab at bedtime Day 6: 1 tab at breakfast     HOME CARE:   Take cool showers and avoid direct sunlight.  Apply cool compress or wet dressings.  Take a bath in an oatmeal bath.  Sprinkle content of one Aveeno packet under running faucet with comfortably warm water.  Bathe for 15-20 minutes, 1-2 times daily.  Pat dry with a towel. Do not rub the rash.  Use hydrocortisone cream.  Take an antihistamine like Benadryl for widespread rashes that itch.  The adult dose of Benadryl is 25-50 mg by mouth 4 times daily.  Caution:  This type of medication may cause sleepiness.  Do not drink alcohol, drive, or operate dangerous machinery while taking antihistamines.  Do not take these medications if you have prostate enlargement.  Read package instructions thoroughly on all medications that you take.  GET HELP RIGHT AWAY IF:   Symptoms don't go away after treatment.  Severe itching that persists.  If you rash spreads or swells.  If you rash begins to smell.  If it blisters and opens or develops a yellow-brown  crust.  You develop a fever.  You have a sore throat.  You become short of breath.  MAKE SURE YOU:  Understand these instructions. Will watch your condition. Will get help right away if you are not doing well or get worse.  Thank you for choosing an e-visit. Your e-visit answers were reviewed by a board certified advanced clinical practitioner to complete your personal care plan. Depending upon the condition, your plan could have included both over the counter or prescription medications. Please review your pharmacy choice. Be sure that the pharmacy you have chosen is open so that you can pick up your prescription now.  If there is a problem you may message your provider in MyChart to have the prescription routed to another pharmacy. Your safety is important to us. If you have drug allergies check your prescription carefully.  For the next 24 hours, you can use MyChart to ask questions about today's visit, request a non-urgent call back, or ask for a work or school excuse from your e-visit provider. You will get an email in the next two days asking about your experience. I hope that your e-visit has been valuable and will speed your recovery.    Greater than 5 minutes, yet less than 10 minutes of time have been spent researching,  coordinating, and implementing care for this patient today.  Thank you for the details you included in the comment boxes. Those details are very helpful in determining the best course of treatment for you and help [place] to provide the best care.

## 2019-02-28 ENCOUNTER — Other Ambulatory Visit: Payer: Self-pay | Admitting: Internal Medicine

## 2019-02-28 NOTE — Telephone Encounter (Signed)
Check [place] registry last filled 12/03/2018.Marland KitchenJohny Chess

## 2019-03-01 MED ORDER — AMPHETAMINE-DEXTROAMPHETAMINE 15 MG PO TABS: 15.0000 mg | ORAL_TABLET | Freq: Three times a day (TID) | ORAL | 0 refills | Status: DC

## 2019-03-23 ENCOUNTER — Other Ambulatory Visit: Payer: Self-pay | Admitting: Internal Medicine

## 2019-04-19 ENCOUNTER — Other Ambulatory Visit: Payer: Self-pay | Admitting: Internal Medicine

## 2019-04-23 ENCOUNTER — Other Ambulatory Visit: Payer: Self-pay | Admitting: Internal Medicine

## 2019-05-19 ENCOUNTER — Telehealth: Payer: Self-pay | Admitting: Internal Medicine

## 2019-05-20 MED ORDER — TRIAMTERENE-HCTZ 37.5-25 MG PO TABS: ORAL_TABLET | ORAL | 0 refills | Status: DC

## 2019-05-20 NOTE — Telephone Encounter (Signed)
Pt has made appt for 10/15, but will be out of medication. Requesting 30 day refill, and she will see you next week! °

## 2019-05-20 NOTE — Telephone Encounter (Signed)
Rx sent 

## 2019-05-20 NOTE — Telephone Encounter (Signed)
Pt has made appt for 10/15, but will be out of medication. Requesting 30 day refill, and she will see you next week!

## 2019-05-20 NOTE — Addendum Note (Signed)
Addended by: Delice Bison E on: 05/20/2019 12:31 PM   Modules accepted: Orders

## 2019-05-25 NOTE — Patient Instructions (Signed)

## 2019-05-25 NOTE — Progress Notes (Signed)
Subjective:    Patient ID: [name], female    DOB: [birth year], 63 y.o.   MRN: [id number]  HPI The patient is here for follow up.        Medications and allergies reviewed with patient and updated if appropriate.  Patient Active Problem List   Diagnosis Date Noted  . Laceration of left index finger without foreign body without damage to nail 09/21/2018  . Need for influenza vaccination 09/21/2018  . Puncture wound of finger of right hand 09/21/2018  . Foot injury, left, initial encounter 02/10/2018  . Cough 02/10/2018  . Hormone replacement therapy (HRT) 02/09/2018  . Incidental lung nodule 01/08/2012  . Eczema 01/08/2012  . Attention deficit disorder 04/14/2007  . Essential hypertension 12/09/2006  . ALLERGIC RHINITIS 12/09/2006  . GERD 12/09/2006  . IBS 12/09/2006    Current Outpatient Medications on File Prior to Visit  Medication Sig Dispense Refill  . amphetamine-dextroamphetamine (ADDERALL) 15 MG tablet Take 1 tablet by mouth 3 (three) times daily. 270 tablet 0  . estradiol (ESTRACE) 1 MG tablet TAKE 1 TABLET BY MOUTH DAILY *NEED APPOINTMENT FOR REFILLS* 30 tablet 0  . loratadine (CLARITIN) 10 MG tablet Take 10 mg by mouth daily as needed.    . predniSONE (STERAPRED UNI-PAK 21 TAB) 10 MG (21) TBPK tablet As directed 21 tablet 0  . triamterene-hydrochlorothiazide (MAXZIDE-25) 37.5-25 MG tablet TAKE 1 TABLET BY MOUTH EVERY DAY. NEED OFFICE VISIT FOR MORE REFILLS. 30 tablet 0  . valACYclovir (VALTREX) 1000 MG tablet Take 2 tablets (2,000 mg total) by mouth 2 (two) times daily as needed. 40 tablet 2  . verapamil (CALAN) 120 MG tablet Take 1 tablet (120 mg total) by mouth 3 (three) times daily. 270 tablet 1   No current facility-administered medications on file prior to visit.     Past Medical History:  Diagnosis Date  . ADD   . ALLERGIC RHINITIS   . GERD   . HOMOCYSTINEMIA   . HYPERTENSION   . IBS   . Incidental lung nodule 02/2011 ct   unchanged  07/16/11 CT, 6 mo f/u  planned    Past Surgical History:  Procedure Laterality Date  . ABDOMINAL HYSTERECTOMY    . Bladder tack  2001    Social History   Socioeconomic History  . Marital status: Legally Separated    Spouse name: Not on file  . Number of children: 3  . Years of education: Not on file  . Highest education level: Not on file  Occupational History  . Occupation: Investment banker, corporate: [employer]  Social Needs  . Financial resource strain: Not on file  . Food insecurity    Worry: Not on file    Inability: Not on file  . Transportation needs    Medical: Not on file    Non-medical: Not on file  Tobacco Use  . Smoking status: Former Smoker    Types: Cigarettes    Start date: 08/11/1976  . Smokeless tobacco: Never Used  . Tobacco comment: Single-occassional boyfriend. 3 children-yougest son with cystic fibrosis. currently unemployed since 01/2010. Prev worked in Airline pilot, frequent travel of town  Substance and Sexual Activity  . Alcohol use: Yes    Comment: a glass of winw a night  . Drug use: No  . Sexual activity: Not on file  Lifestyle  . Physical activity    Days per week: Not on file    Minutes per session: Not on file  .  Stress: Not on file  Relationships  . Social Herbalist on phone: Not on file    Gets together: Not on file    Attends religious service: Not on file    Active member of club or organization: Not on file    Attends meetings of clubs or organizations: Not on file    Relationship status: Not on file  Other Topics Concern  . Not on file  Social History Narrative  . Not on file    Family History  Problem Relation Age of Onset  . Coronary artery disease Mother   . Hypertension Mother   . Heart failure Mother   . Parkinson's disease Mother   . Coronary artery disease Father   . Hypertension Father   . Diabetes Father   . Stroke Father   . Coronary artery disease Maternal Aunt   . Stroke Maternal Aunt   .  Parkinson's disease Maternal Aunt   . Stroke Maternal Uncle   . Stroke Paternal Uncle   . Hypertension Maternal Grandmother   . Hypertension Maternal Grandfather   . Colon cancer Maternal Grandfather   . Hypertension Paternal Grandmother   . Hypertension Paternal Grandfather   . Colon cancer Paternal Grandfather   . Colon cancer Cousin     Review of Systems     Objective:  There were no vitals filed for this visit. BP Readings from Last 3 Encounters:  09/21/18 128/80  02/10/18 136/90  01/12/17 128/88   Wt Readings from Last 3 Encounters:  09/21/18 159 lb 3.2 oz (72.2 kg)  02/10/18 158 lb (71.7 kg)  01/12/17 153 lb (69.4 kg)   There is no height or weight on file to calculate BMI.   Physical Exam          Assessment & Plan:    See Problem List for Assessment and Plan of chronic medical problems.   This encounter was created in error - please disregard.

## 2019-05-26 ENCOUNTER — Encounter: Payer: Self-pay | Admitting: Internal Medicine

## 2019-05-26 NOTE — Progress Notes (Signed)
Subjective:    Patient ID: [name], female    DOB: 1956-06-02, 63 y.o.   MRN: [id number]  HPI The patient is here for follow up.  She is very active, but not exercising regularly.    Hypertension: She is taking her medication daily. She is compliant with a low sodium diet.  She denies chest pain, palpitations, edema, shortness of breath and regular headaches. She does monitor her blood pressure at home.    ADD:  She is taking her medication as prescribed.  She feels the medication is effective.  She denies side effects, including palpitations, headaches, lightheadedness, decreased appetite and weight loss.   HRT:  She is taking estradiol daily and would like to continue it at its current dose.      Medications and allergies reviewed with patient and updated if appropriate.  Patient Active Problem List   Diagnosis Date Noted  . Cough 02/10/2018  . Hormone replacement therapy (HRT) 02/09/2018  . Incidental lung nodule 01/08/2012  . Eczema 01/08/2012  . Attention deficit disorder 04/14/2007  . Essential hypertension 12/09/2006  . ALLERGIC RHINITIS 12/09/2006  . GERD 12/09/2006  . IBS 12/09/2006    Current Outpatient Medications on File Prior to Visit  Medication Sig Dispense Refill  . amphetamine-dextroamphetamine (ADDERALL) 15 MG tablet Take 1 tablet by mouth 3 (three) times daily. 270 tablet 0  . estradiol (ESTRACE) 1 MG tablet TAKE 1 TABLET BY MOUTH DAILY *NEED APPOINTMENT FOR REFILLS* 30 tablet 0  . loratadine (CLARITIN) 10 MG tablet Take 10 mg by mouth daily as needed.    . triamterene-hydrochlorothiazide (MAXZIDE-25) 37.5-25 MG tablet TAKE 1 TABLET BY MOUTH EVERY DAY. NEED OFFICE VISIT FOR MORE REFILLS. 30 tablet 0  . valACYclovir (VALTREX) 1000 MG tablet Take 2 tablets (2,000 mg total) by mouth 2 (two) times daily as needed. 40 tablet 2  . verapamil (CALAN) 120 MG tablet Take 1 tablet (120 mg total) by mouth 3 (three) times daily. 270 tablet 1   No current  facility-administered medications on file prior to visit.     Past Medical History:  Diagnosis Date  . ADD   . ALLERGIC RHINITIS   . GERD   . HOMOCYSTINEMIA   . HYPERTENSION   . IBS   . Incidental lung nodule 02/2011 ct   unchanged 07/16/11 CT, 6 mo f/u  planned    Past Surgical History:  Procedure Laterality Date  . ABDOMINAL HYSTERECTOMY    . Bladder tack  2001    Social History   Socioeconomic History  . Marital status: Legally Separated    Spouse name: Not on file  . Number of children: 3  . Years of education: Not on file  . Highest education level: Not on file  Occupational History  . Occupation: Scientist, clinical (histocompatibility and immunogenetics): [employer]  . Financial resource strain: Not on file  . Food insecurity    Worry: Not on file    Inability: Not on file  . Transportation needs    Medical: Not on file    Non-medical: Not on file  Tobacco Use  . Smoking status: Former Smoker    Types: Cigarettes    Start date: 08/11/1976  . Smokeless tobacco: Never Used  . Tobacco comment: Single-occassional boyfriend. 3 children-yougest son with cystic fibrosis. currently unemployed since 01/2010. Prev worked in Press photographer, frequent travel of town  Substance and Sexual Activity  . Alcohol use: Yes    Comment: a glass  of winw a night  . Drug use: No  . Sexual activity: Not on file  Lifestyle  . Physical activity    Days per week: Not on file    Minutes per session: Not on file  . Stress: Not on file  Relationships  . Social Musician on phone: Not on file    Gets together: Not on file    Attends religious service: Not on file    Active member of club or organization: Not on file    Attends meetings of clubs or organizations: Not on file    Relationship status: Not on file  Other Topics Concern  . Not on file  Social History Narrative  . Not on file    Family History  Problem Relation Age of Onset  . Coronary artery disease Mother   . Hypertension Mother    . Heart failure Mother   . Parkinson's disease Mother   . Coronary artery disease Father   . Hypertension Father   . Diabetes Father   . Stroke Father   . Coronary artery disease Maternal Aunt   . Stroke Maternal Aunt   . Parkinson's disease Maternal Aunt   . Stroke Maternal Uncle   . Stroke Paternal Uncle   . Hypertension Maternal Grandmother   . Hypertension Maternal Grandfather   . Colon cancer Maternal Grandfather   . Hypertension Paternal Grandmother   . Hypertension Paternal Grandfather   . Colon cancer Paternal Grandfather   . Colon cancer Cousin     Review of Systems  Constitutional: Negative for chills and fever.  Respiratory: Negative for cough, shortness of breath and wheezing.   Cardiovascular: Negative for chest pain, palpitations and leg swelling.  Neurological: Positive for headaches (occ). Negative for light-headedness.       Objective:   Vitals:   05/27/19 0751  BP: (!) 140/92  Pulse: 76  Resp: 16  Temp: 98.3 F (36.8 C)  SpO2: 97%   BP Readings from Last 3 Encounters:  05/27/19 (!) 140/92  09/21/18 128/80  02/10/18 136/90   Wt Readings from Last 3 Encounters:  05/27/19 158 lb (71.7 kg)  09/21/18 159 lb 3.2 oz (72.2 kg)  02/10/18 158 lb (71.7 kg)   Body mass index is 25.5 kg/m.   Physical Exam    Constitutional: Appears well-developed and well-nourished. No distress.  HENT:  Head: Normocephalic and atraumatic.  Neck: Neck supple. No tracheal deviation present. No thyromegaly present.  No cervical lymphadenopathy Cardiovascular: Normal rate, regular rhythm and normal heart sounds.   No murmur heard. No carotid bruit .  No edema Pulmonary/Chest: Effort normal and breath sounds normal. No respiratory distress. No has no wheezes. No rales.  Skin: Skin is warm and dry. Not diaphoretic.  Psychiatric: Normal mood and affect. Behavior is normal.      Assessment & Plan:    See Problem List for Assessment and Plan of chronic medical  problems.

## 2019-05-26 NOTE — Patient Instructions (Addendum)
tetanus immunization administered today.   Medications reviewed and updated.  Changes include : none    Your prescription(s) have been submitted to your pharmacy. Please take as directed and contact our office if you believe you are having problem(s) with the medication(s).    Please followup in 6 months

## 2019-05-27 ENCOUNTER — Encounter: Payer: Self-pay | Admitting: Internal Medicine

## 2019-05-27 ENCOUNTER — Ambulatory Visit (INDEPENDENT_AMBULATORY_CARE_PROVIDER_SITE_OTHER): Payer: Self-pay | Admitting: Internal Medicine

## 2019-05-27 ENCOUNTER — Other Ambulatory Visit: Payer: Self-pay

## 2019-05-27 VITALS — BP 140/92 | HR 76 | Temp 98.3°F | Resp 16 | Ht 66.0 in | Wt 158.0 lb

## 2019-05-27 DIAGNOSIS — I10 Essential (primary) hypertension: Secondary | ICD-10-CM

## 2019-05-27 DIAGNOSIS — Z23 Encounter for immunization: Secondary | ICD-10-CM

## 2019-05-27 DIAGNOSIS — F988 Other specified behavioral and emotional disorders with onset usually occurring in childhood and adolescence: Secondary | ICD-10-CM

## 2019-05-27 DIAGNOSIS — Z79890 Hormone replacement therapy (postmenopausal): Secondary | ICD-10-CM

## 2019-05-27 MED ORDER — AMPHETAMINE-DEXTROAMPHETAMINE 15 MG PO TABS: 15.0000 mg | ORAL_TABLET | Freq: Three times a day (TID) | ORAL | 0 refills | Status: DC

## 2019-05-27 NOTE — Assessment & Plan Note (Addendum)
BP well controlled overall, slightly elevated here today Current regimen effective and well tolerated Continue current medications at current doses

## 2019-05-27 NOTE — Assessment & Plan Note (Signed)
Controlled, stable Continue current dose of medication  - Adderall 15 mg TID

## 2019-05-27 NOTE — Assessment & Plan Note (Signed)
She is doing well with her current dose of estradiol Will continue - estradiol 1 mg daily

## 2019-05-28 ENCOUNTER — Other Ambulatory Visit: Payer: Self-pay | Admitting: Internal Medicine

## 2019-06-13 ENCOUNTER — Other Ambulatory Visit: Payer: Self-pay | Admitting: Internal Medicine

## 2019-06-22 ENCOUNTER — Other Ambulatory Visit: Payer: Self-pay | Admitting: Internal Medicine

## 2019-08-18 ENCOUNTER — Other Ambulatory Visit: Payer: Self-pay | Admitting: Internal Medicine

## 2019-08-18 MED ORDER — AMPHETAMINE-DEXTROAMPHETAMINE 15 MG PO TABS: 15.0000 mg | ORAL_TABLET | Freq: Three times a day (TID) | ORAL | 0 refills | Status: DC

## 2019-08-18 NOTE — Telephone Encounter (Signed)
[place] Controlled Database Checked Last filled: 05/28/19 # 270 LOV w/you: 05/27/19 Next appt w/you: None

## 2019-08-24 ENCOUNTER — Other Ambulatory Visit: Payer: Self-pay | Admitting: Internal Medicine

## 2019-09-16 ENCOUNTER — Other Ambulatory Visit: Payer: Self-pay | Admitting: Internal Medicine

## 2019-09-16 MED ORDER — VERAPAMIL HCL 120 MG PO TABS: 120.0000 mg | ORAL_TABLET | Freq: Three times a day (TID) | ORAL | 0 refills | Status: DC

## 2019-09-16 MED ORDER — AMPHETAMINE-DEXTROAMPHETAMINE 15 MG PO TABS: 15.0000 mg | ORAL_TABLET | Freq: Three times a day (TID) | ORAL | 0 refills | Status: DC

## 2019-09-16 MED ORDER — VALACYCLOVIR HCL 1 G PO TABS: 2000.0000 mg | ORAL_TABLET | Freq: Two times a day (BID) | ORAL | 0 refills | Status: DC | PRN

## 2019-09-16 MED ORDER — TRIAMTERENE-HCTZ 37.5-25 MG PO TABS: ORAL_TABLET | ORAL | 0 refills | Status: DC

## 2019-09-16 MED ORDER — ESTRADIOL 1 MG PO TABS: ORAL_TABLET | ORAL | 0 refills | Status: DC

## 2019-09-16 NOTE — Telephone Encounter (Signed)
Adderall pended. Please advise.

## 2019-09-16 NOTE — Telephone Encounter (Signed)
   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 All medications need to be refilled.     1. Which medications need to be refilled? (please list name of each medication and dose if known)  ALL meds   2. Which pharmacy/location (including street and city if local pharmacy) is medication to be sent to? [facility]  Phone  [phone]  [address] [place]  3. Do they need a 30 day or 90 day supply?

## 2019-09-20 ENCOUNTER — Other Ambulatory Visit: Payer: Self-pay

## 2019-09-20 MED ORDER — TRIAMTERENE-HCTZ 37.5-25 MG PO TABS: ORAL_TABLET | ORAL | 0 refills | Status: DC

## 2019-10-17 ENCOUNTER — Encounter: Payer: Self-pay | Admitting: Internal Medicine

## 2019-10-17 MED ORDER — AMPHETAMINE-DEXTROAMPHETAMINE 15 MG PO TABS: 15.0000 mg | ORAL_TABLET | Freq: Three times a day (TID) | ORAL | 0 refills | Status: DC

## 2019-10-17 NOTE — Telephone Encounter (Signed)
Check [place] registry last filled 08/24/2019, also updated pharmacy.[name]

## 2019-11-15 ENCOUNTER — Other Ambulatory Visit: Payer: Self-pay | Admitting: Internal Medicine

## 2019-11-17 MED ORDER — AMPHETAMINE-DEXTROAMPHETAMINE 15 MG PO TABS: 15.0000 mg | ORAL_TABLET | Freq: Three times a day (TID) | ORAL | 0 refills | Status: DC

## 2019-11-27 ENCOUNTER — Other Ambulatory Visit: Payer: Self-pay | Admitting: Internal Medicine

## 2019-12-01 ENCOUNTER — Ambulatory Visit: Payer: Self-pay

## 2019-12-01 ENCOUNTER — Ambulatory Visit: Payer: Self-pay | Attending: Internal Medicine

## 2019-12-01 DIAGNOSIS — Z23 Encounter for immunization: Secondary | ICD-10-CM

## 2019-12-01 NOTE — Progress Notes (Signed)
   Covid-19 Vaccination Clinic  Name:  [name]    MRN: [id number] DOB: 11/27/1955  12/01/2019  [name] was observed post Covid-19 immunization for 15 minutes without incident. She was provided with Vaccine Information Sheet and instruction to access the V-Safe system.   [name] was instructed to call 911 with any severe reactions post vaccine: Marland Kitchen Difficulty breathing  . Swelling of face and throat  . A fast heartbeat  . A bad rash all over body  . Dizziness and weakness   Immunizations Administered    Name Date Dose VIS Date Route   Pfizer COVID-19 Vaccine 12/01/2019  4:37 PM 0.3 mL 10/05/2018 Intramuscular   Manufacturer: [employer], [employer]   Lot: [id number]   NDC: [address]-4

## 2019-12-14 ENCOUNTER — Other Ambulatory Visit: Payer: Self-pay | Admitting: Internal Medicine

## 2019-12-14 MED ORDER — AMPHETAMINE-DEXTROAMPHETAMINE 15 MG PO TABS: 15.0000 mg | ORAL_TABLET | Freq: Three times a day (TID) | ORAL | 0 refills | Status: DC

## 2019-12-14 MED ORDER — VERAPAMIL HCL 120 MG PO TABS: 120.0000 mg | ORAL_TABLET | Freq: Three times a day (TID) | ORAL | 0 refills | Status: DC

## 2019-12-14 NOTE — Telephone Encounter (Signed)
Check [place] registry last filled Adderal 11/17/2019.Marland KitchenRaechel Chute

## 2019-12-24 ENCOUNTER — Other Ambulatory Visit: Payer: Self-pay | Admitting: Internal Medicine

## 2020-01-09 ENCOUNTER — Other Ambulatory Visit: Payer: Self-pay | Admitting: Internal Medicine

## 2020-01-12 ENCOUNTER — Other Ambulatory Visit: Payer: Self-pay | Admitting: Internal Medicine

## 2020-01-13 MED ORDER — AMPHETAMINE-DEXTROAMPHETAMINE 15 MG PO TABS: 15.0000 mg | ORAL_TABLET | Freq: Three times a day (TID) | ORAL | 0 refills | Status: DC

## 2020-02-08 ENCOUNTER — Telehealth: Payer: Self-pay | Admitting: Internal Medicine

## 2020-02-08 MED ORDER — AMPHETAMINE-DEXTROAMPHETAMINE 15 MG PO TABS: 15.0000 mg | ORAL_TABLET | Freq: Three times a day (TID) | ORAL | 0 refills | Status: DC

## 2020-02-08 NOTE — Telephone Encounter (Signed)
   1.Medication Requested:amphetamine-dextroamphetamine (ADDERALL) 15 MG tablet  2. Pharmacy (Name, Street, City):[id number] - [place], [place] - [address]  3. On Med List: yes  4. Last Visit with PCP: 05/27/19  5. Next visit date with PCP:02/21/20   Agent: Please be advised that RX refills may take up to 3 business days. We ask that you follow-up with your pharmacy.

## 2020-02-21 ENCOUNTER — Ambulatory Visit: Payer: Self-pay | Admitting: Internal Medicine

## 2020-02-23 ENCOUNTER — Other Ambulatory Visit: Payer: Self-pay | Admitting: Internal Medicine

## 2020-02-24 ENCOUNTER — Other Ambulatory Visit: Payer: Self-pay | Admitting: Internal Medicine

## 2020-03-01 ENCOUNTER — Other Ambulatory Visit: Payer: Self-pay | Admitting: Internal Medicine

## 2020-03-02 MED ORDER — VERAPAMIL HCL 120 MG PO TABS: 120.0000 mg | ORAL_TABLET | Freq: Three times a day (TID) | ORAL | 0 refills | Status: DC

## 2020-03-06 NOTE — Progress Notes (Signed)
Subjective:    Patient ID: [name], female    DOB: 1956/01/06, 64 y.o.   MRN: [id number]  HPI The patient is here for follow up of their chronic medical problems, including htn, ADD, HRT.  She is very active, but not exercising regularly.     She has no concerns.  She did twist her left ankle recently, but it is improving and she is not concerned.      Medications and allergies reviewed with patient and updated if appropriate.  Patient Active Problem List   Diagnosis Date Noted  . Cough 02/10/2018  . Hormone replacement therapy (HRT) 02/09/2018  . Incidental lung nodule 01/08/2012  . Eczema 01/08/2012  . Attention deficit disorder 04/14/2007  . Essential hypertension 12/09/2006  . ALLERGIC RHINITIS 12/09/2006  . IBS 12/09/2006    Current Outpatient Medications on File Prior to Visit  Medication Sig Dispense Refill  . loratadine (CLARITIN) 10 MG tablet Take 10 mg by mouth daily as needed.     No current facility-administered medications on file prior to visit.    Past Medical History:  Diagnosis Date  . ADD   . ALLERGIC RHINITIS   . GERD   . HOMOCYSTINEMIA   . HYPERTENSION   . IBS   . Incidental lung nodule 02/2011 ct   unchanged 07/16/11 CT, 6 mo f/u  planned    Past Surgical History:  Procedure Laterality Date  . ABDOMINAL HYSTERECTOMY    . Bladder tack  2001    Social History   Socioeconomic History  . Marital status: Legally Separated    Spouse name: Not on file  . Number of children: 3  . Years of education: Not on file  . Highest education level: Not on file  Occupational History  . Occupation: Investment banker, corporate: [employer]  Tobacco Use  . Smoking status: Former Smoker    Types: Cigarettes    Start date: 08/11/1976  . Smokeless tobacco: Never Used  . Tobacco comment: Single-occassional boyfriend. 3 children-yougest son with cystic fibrosis. currently unemployed since 01/2010. Prev worked in Airline pilot, frequent travel of town    Substance and Sexual Activity  . Alcohol use: Yes    Comment: a glass of winw a night  . Drug use: No  . Sexual activity: Not on file  Other Topics Concern  . Not on file  Social History Narrative  . Not on file   Social Determinants of Health   Financial Resource Strain:   . Difficulty of Paying Living Expenses:   Food Insecurity:   . Worried About Programme researcher, broadcasting/film/video in the Last Year:   . Barista in the Last Year:   Transportation Needs:   . Freight forwarder (Medical):   Marland Kitchen Lack of Transportation (Non-Medical):   Physical Activity:   . Days of Exercise per Week:   . Minutes of Exercise per Session:   Stress:   . Feeling of Stress :   Social Connections:   . Frequency of Communication with Friends and Family:   . Frequency of Social Gatherings with Friends and Family:   . Attends Religious Services:   . Active Member of Clubs or Organizations:   . Attends Banker Meetings:   Marland Kitchen Marital Status:     Family History  Problem Relation Age of Onset  . Coronary artery disease Mother   . Hypertension Mother   . Heart failure Mother   . Parkinson's disease  Mother   . Coronary artery disease Father   . Hypertension Father   . Diabetes Father   . Stroke Father   . Coronary artery disease Maternal Aunt   . Stroke Maternal Aunt   . Parkinson's disease Maternal Aunt   . Stroke Maternal Uncle   . Stroke Paternal Uncle   . Hypertension Maternal Grandmother   . Hypertension Maternal Grandfather   . Colon cancer Maternal Grandfather   . Hypertension Paternal Grandmother   . Hypertension Paternal Grandfather   . Colon cancer Paternal Grandfather   . Colon cancer Cousin     Review of Systems  Constitutional: Negative for chills and fever.  Respiratory: Negative for cough, shortness of breath and wheezing.   Cardiovascular: Negative for chest pain, palpitations and leg swelling.  Neurological: Positive for dizziness (occ). Negative for  light-headedness and headaches.       Objective:   Vitals:   03/07/20 0818  BP: (!) 140/88  Pulse: 80  Temp: 98.1 F (36.7 C)  SpO2: 98%   BP Readings from Last 3 Encounters:  03/07/20 (!) 140/88  05/27/19 (!) 140/92  09/21/18 128/80   Wt Readings from Last 3 Encounters:  03/07/20 160 lb (72.6 kg)  05/27/19 158 lb (71.7 kg)  09/21/18 159 lb 3.2 oz (72.2 kg)   Body mass index is 25.82 kg/m.   Physical Exam    Constitutional: Appears well-developed and well-nourished. No distress.  HENT:  Head: Normocephalic and atraumatic.  Neck: Neck supple. No tracheal deviation present. No thyromegaly present.  No cervical lymphadenopathy Cardiovascular: Normal rate, regular rhythm and normal heart sounds.   No murmur heard. No carotid bruit .  No edema Pulmonary/Chest: Effort normal and breath sounds normal. No respiratory distress. No has no wheezes. No rales.  Skin: Skin is warm and dry. Not diaphoretic.  Psychiatric: Normal mood and affect. Behavior is normal.      Assessment & Plan:    See Problem List for Assessment and Plan of chronic medical problems.    This visit occurred during the SARS-CoV-2 public health emergency.  Safety protocols were in place, including screening questions prior to the visit, additional usage of staff PPE, and extensive cleaning of exam room while observing appropriate contact time as indicated for disinfecting solutions.

## 2020-03-06 NOTE — Patient Instructions (Addendum)
  Blood work was ordered.     Medications reviewed and updated.  Changes include :   none  Your prescription(s) have been submitted to your pharmacy. Please take as directed and contact our office if you believe you are having problem(s) with the medication(s).   Please followup in 6 months   

## 2020-03-07 ENCOUNTER — Other Ambulatory Visit: Payer: Self-pay

## 2020-03-07 ENCOUNTER — Ambulatory Visit (INDEPENDENT_AMBULATORY_CARE_PROVIDER_SITE_OTHER): Payer: Self-pay | Admitting: Internal Medicine

## 2020-03-07 ENCOUNTER — Encounter: Payer: Self-pay | Admitting: Internal Medicine

## 2020-03-07 VITALS — BP 140/88 | HR 80 | Temp 98.1°F | Ht 66.0 in | Wt 160.0 lb

## 2020-03-07 DIAGNOSIS — Z79890 Hormone replacement therapy (postmenopausal): Secondary | ICD-10-CM

## 2020-03-07 DIAGNOSIS — F988 Other specified behavioral and emotional disorders with onset usually occurring in childhood and adolescence: Secondary | ICD-10-CM

## 2020-03-07 DIAGNOSIS — I10 Essential (primary) hypertension: Secondary | ICD-10-CM

## 2020-03-07 LAB — CBC WITH DIFFERENTIAL/PLATELET
Absolute Monocytes: 383 cells/uL (ref 200–950)
Basophils Absolute: 30 cells/uL (ref 0–200)
Basophils Relative: 0.7 %
Eosinophils Absolute: 52 cells/uL (ref 15–500)
Eosinophils Relative: 1.2 %
HCT: 41.4 % (ref 35.0–45.0)
Hemoglobin: 14 g/dL (ref 11.7–15.5)
Lymphs Abs: 1011 cells/uL (ref 850–3900)
MCH: 32.9 pg (ref 27.0–33.0)
MCHC: 33.8 g/dL (ref 32.0–36.0)
MCV: 97.4 fL (ref 80.0–100.0)
MPV: 10.4 fL (ref 7.5–12.5)
Monocytes Relative: 8.9 %
Neutro Abs: 2825 cells/uL (ref 1500–7800)
Neutrophils Relative %: 65.7 %
Platelets: 243 10*3/uL (ref 140–400)
RBC: 4.25 10*6/uL (ref 3.80–5.10)
RDW: 13.4 % (ref 11.0–15.0)
Total Lymphocyte: 23.5 %
WBC: 4.3 10*3/uL (ref 3.8–10.8)

## 2020-03-07 LAB — COMPLETE METABOLIC PANEL WITH GFR
AG Ratio: 2 (calc) (ref 1.0–2.5)
ALT: 21 U/L (ref 6–29)
AST: 23 U/L (ref 10–35)
Albumin: 4.4 g/dL (ref 3.6–5.1)
Alkaline phosphatase (APISO): 68 U/L (ref 37–153)
BUN: 9 mg/dL (ref 7–25)
CO2: 31 mmol/L (ref 20–32)
Calcium: 9.4 mg/dL (ref 8.6–10.4)
Chloride: 102 mmol/L (ref 98–110)
Creat: 0.75 mg/dL (ref 0.50–0.99)
GFR, Est African American: 99 mL/min/{1.73_m2} (ref >=60)
GFR, Est Non African American: 85 mL/min/{1.73_m2} (ref >=60)
Globulin: 2.2 g/dL (calc) (ref 1.9–3.7)
Glucose, Bld: 84 mg/dL (ref 65–99)
Potassium: 4.2 mmol/L (ref 3.5–5.3)
Sodium: 140 mmol/L (ref 135–146)
Total Bilirubin: 0.4 mg/dL (ref 0.2–1.2)
Total Protein: 6.6 g/dL (ref 6.1–8.1)

## 2020-03-07 LAB — LIPID PANEL
Cholesterol: 188 mg/dL (ref <200)
HDL: 80 mg/dL (ref >=50)
LDL Cholesterol (Calc): 87 mg/dL (calc)
Non-HDL Cholesterol (Calc): 108 mg/dL (calc) (ref <130)
Total CHOL/HDL Ratio: 2.4 (calc) (ref <5.0)
Triglycerides: 116 mg/dL (ref <150)

## 2020-03-07 MED ORDER — ESTRADIOL 1 MG PO TABS: ORAL_TABLET | ORAL | 1 refills | Status: DC

## 2020-03-07 MED ORDER — TRIAMTERENE-HCTZ 37.5-25 MG PO TABS: 1.0000 | ORAL_TABLET | Freq: Every day | ORAL | 1 refills | Status: DC

## 2020-03-07 MED ORDER — AMPHETAMINE-DEXTROAMPHETAMINE 15 MG PO TABS: 15.0000 mg | ORAL_TABLET | Freq: Three times a day (TID) | ORAL | 0 refills | Status: DC

## 2020-03-07 MED ORDER — VERAPAMIL HCL 120 MG PO TABS: 120.0000 mg | ORAL_TABLET | Freq: Three times a day (TID) | ORAL | 1 refills | Status: DC

## 2020-03-07 MED ORDER — VALACYCLOVIR HCL 1 G PO TABS: 2000.0000 mg | ORAL_TABLET | Freq: Two times a day (BID) | ORAL | 0 refills | Status: DC | PRN

## 2020-03-07 NOTE — Assessment & Plan Note (Signed)
Chronic Controlled, stable Continue current dose of medication Adderall TID 15 mg

## 2020-03-07 NOTE — Assessment & Plan Note (Signed)
Chronic BP slightly elevated here today Continue current medications at current doses Cmp, cbc

## 2020-03-07 NOTE — Assessment & Plan Note (Signed)
Chronic Doing well on estradiol and wants to continue it Discussed risks - blood clots and breast ca --- advised getting a mammogram Despite risks she feels benefits are greater Refilled today

## 2020-03-08 ENCOUNTER — Encounter: Payer: Self-pay | Admitting: Internal Medicine

## 2020-03-08 MED ORDER — AMPHETAMINE-DEXTROAMPHETAMINE 15 MG PO TABS: 15.0000 mg | ORAL_TABLET | Freq: Three times a day (TID) | ORAL | 0 refills | Status: DC

## 2020-03-09 NOTE — Telephone Encounter (Signed)
Patient calling and states that she went to the pharmacy to pick up medications yesterday and they told her it was 1 day too early for the adderall. States that she left for [place]. Would like to know if the prescription that was sent yesterday could be canceled and a new rx be sent to the [facility] in [place]. Please advise.  States that she is out of this medication and needs it before the weekend.  [id number] - [place], [place] - [address] [place] [address]

## 2020-03-22 ENCOUNTER — Other Ambulatory Visit: Payer: Self-pay | Admitting: Internal Medicine

## 2020-04-06 ENCOUNTER — Telehealth: Payer: Self-pay | Admitting: Internal Medicine

## 2020-04-06 MED ORDER — AMPHETAMINE-DEXTROAMPHETAMINE 15 MG PO TABS: 15.0000 mg | ORAL_TABLET | Freq: Three times a day (TID) | ORAL | 0 refills | Status: DC

## 2020-04-06 NOTE — Telephone Encounter (Signed)
   1.Medication Requested: amphetamine-dextroamphetamine (ADDERALL) 15 MG tablet  2. Pharmacy (Name, Street, [place]): [id number] - [place], [place] - [address]  3. On Med List: yes  4. Last Visit with PCP: 03/07/20  5. Next visit date with PCP:   Agent: Please be advised that RX refills may take up to 3 business days. We ask that you follow-up with your pharmacy.

## 2020-04-06 NOTE — Addendum Note (Signed)
Addended by: Pincus Sanes on: 04/06/2020 04:23 PM   Modules accepted: Orders

## 2020-05-01 ENCOUNTER — Telehealth: Payer: Self-pay | Admitting: Internal Medicine

## 2020-05-01 DIAGNOSIS — R350 Frequency of micturition: Secondary | ICD-10-CM

## 2020-05-01 NOTE — Telephone Encounter (Signed)
Ok - urine ordered for here

## 2020-05-01 NOTE — Telephone Encounter (Signed)
   Patient requesting order for urine test. She has been treating UTI symptoms with AZO. Patient states AZO has worked well, but still having some symptoms.  Declined to schedule appointment.

## 2020-05-01 NOTE — Telephone Encounter (Signed)
Sent to Dr. Burns 

## 2020-05-24 ENCOUNTER — Other Ambulatory Visit: Payer: Self-pay | Admitting: Internal Medicine

## 2020-06-29 ENCOUNTER — Other Ambulatory Visit: Payer: Self-pay | Admitting: Internal Medicine

## 2020-06-29 MED ORDER — AMPHETAMINE-DEXTROAMPHETAMINE 15 MG PO TABS: 15.0000 mg | ORAL_TABLET | Freq: Three times a day (TID) | ORAL | 0 refills | Status: DC

## 2020-08-02 ENCOUNTER — Other Ambulatory Visit: Payer: Self-pay | Admitting: Internal Medicine

## 2020-08-13 ENCOUNTER — Telehealth: Payer: Self-pay | Admitting: Family

## 2020-08-13 DIAGNOSIS — B9689 Other specified bacterial agents as the cause of diseases classified elsewhere: Secondary | ICD-10-CM

## 2020-08-13 DIAGNOSIS — J019 Acute sinusitis, unspecified: Secondary | ICD-10-CM

## 2020-08-13 MED ORDER — AMOXICILLIN-POT CLAVULANATE 875-125 MG PO TABS: 1.0000 | ORAL_TABLET | Freq: Two times a day (BID) | ORAL | 0 refills | Status: DC

## 2020-08-13 NOTE — Progress Notes (Signed)

## 2020-09-12 ENCOUNTER — Other Ambulatory Visit: Payer: Self-pay | Admitting: Internal Medicine

## 2020-09-17 ENCOUNTER — Other Ambulatory Visit: Payer: Self-pay | Admitting: Internal Medicine

## 2020-09-17 MED ORDER — AMPHETAMINE-DEXTROAMPHETAMINE 15 MG PO TABS: 15.0000 mg | ORAL_TABLET | Freq: Three times a day (TID) | ORAL | 0 refills | Status: DC

## 2020-11-14 ENCOUNTER — Telehealth: Payer: Self-pay | Admitting: Internal Medicine

## 2020-11-14 NOTE — Telephone Encounter (Signed)
Advised to be seen within 24 hours. Patient is scheduled for tomorrow.

## 2020-11-14 NOTE — Telephone Encounter (Signed)
Patient calling, states this morning she had some pain in her wrist that is radiating now all the way to her shoulder and some of her chest . Transferred to team health for further eval.

## 2020-11-14 NOTE — Progress Notes (Deleted)
Subjective:    Patient ID: [name], female    DOB: 14-Sep-1955, 65 y.o.   MRN: [id number]  HPI The patient is here for an acute visit.   Right arm pain after using power tool -   Medications and allergies reviewed with patient and updated if appropriate.  Patient Active Problem List   Diagnosis Date Noted  . Cough 02/10/2018  . Hormone replacement therapy (HRT) 02/09/2018  . Incidental lung nodule 01/08/2012  . Eczema 01/08/2012  . Attention deficit disorder 04/14/2007  . Essential hypertension 12/09/2006  . ALLERGIC RHINITIS 12/09/2006  . IBS 12/09/2006    Current Outpatient Medications on File Prior to Visit  Medication Sig Dispense Refill  . amoxicillin-clavulanate (AUGMENTIN) 875-125 MG tablet Take 1 tablet by mouth 2 (two) times daily. 14 tablet 0  . amphetamine-dextroamphetamine (ADDERALL) 15 MG tablet Take 1 tablet by mouth 3 (three) times daily. 270 tablet 0  . estradiol (ESTRACE) 1 MG tablet TAKE 1 TABLET BY MOUTH EVERY DAY 90 tablet 0  . loratadine (CLARITIN) 10 MG tablet Take 10 mg by mouth daily as needed.    . triamterene-hydrochlorothiazide (MAXZIDE-25) 37.5-25 MG tablet Take 1 tablet by mouth daily. 90 tablet 1  . valACYclovir (VALTREX) 1000 MG tablet Take 2 tablets (2,000 mg total) by mouth 2 (two) times daily as needed. 60 tablet 0  . verapamil (CALAN) 120 MG tablet TAKE 1 TABLET(120 MG) BY MOUTH THREE TIMES DAILY 270 tablet 0   No current facility-administered medications on file prior to visit.    Past Medical History:  Diagnosis Date  . ADD   . ALLERGIC RHINITIS   . GERD   . HOMOCYSTINEMIA   . HYPERTENSION   . IBS   . Incidental lung nodule 02/2011 ct   unchanged 07/16/11 CT, 6 mo f/u  planned    Past Surgical History:  Procedure Laterality Date  . ABDOMINAL HYSTERECTOMY    . Bladder tack  2001    Social History   Socioeconomic History  . Marital status: Legally Separated    Spouse name: Not on file  . Number of children: 3  .  Years of education: Not on file  . Highest education level: Not on file  Occupational History  . Occupation: Investment banker, corporate: [employer]  Tobacco Use  . Smoking status: Former Smoker    Types: Cigarettes    Start date: 08/11/1976  . Smokeless tobacco: Never Used  . Tobacco comment: Single-occassional boyfriend. 3 children-yougest son with cystic fibrosis. currently unemployed since 01/2010. Prev worked in Airline pilot, frequent travel of town  Substance and Sexual Activity  . Alcohol use: Yes    Comment: a glass of winw a night  . Drug use: No  . Sexual activity: Not on file  Other Topics Concern  . Not on file  Social History Narrative  . Not on file   Social Determinants of Health   Financial Resource Strain: Not on file  Food Insecurity: Not on file  Transportation Needs: Not on file  Physical Activity: Not on file  Stress: Not on file  Social Connections: Not on file    Family History  Problem Relation Age of Onset  . Coronary artery disease Mother   . Hypertension Mother   . Heart failure Mother   . Parkinson's disease Mother   . Coronary artery disease Father   . Hypertension Father   . Diabetes Father   . Stroke Father   . Coronary artery  disease Maternal Aunt   . Stroke Maternal Aunt   . Parkinson's disease Maternal Aunt   . Stroke Maternal Uncle   . Stroke Paternal Uncle   . Hypertension Maternal Grandmother   . Hypertension Maternal Grandfather   . Colon cancer Maternal Grandfather   . Hypertension Paternal Grandmother   . Hypertension Paternal Grandfather   . Colon cancer Paternal Grandfather   . Colon cancer Cousin     Review of Systems     Objective:  There were no vitals filed for this visit. BP Readings from Last 3 Encounters:  03/07/20 (!) 140/88  05/27/19 (!) 140/92  09/21/18 128/80   Wt Readings from Last 3 Encounters:  03/07/20 160 lb (72.6 kg)  05/27/19 158 lb (71.7 kg)  09/21/18 159 lb 3.2 oz (72.2 kg)   There is no height or  weight on file to calculate BMI.   Physical Exam         Assessment & Plan:    See Problem List for Assessment and Plan of chronic medical problems.    This visit occurred during the SARS-CoV-2 public health emergency.  Safety protocols were in place, including screening questions prior to the visit, additional usage of staff PPE, and extensive cleaning of exam room while observing appropriate contact time as indicated for disinfecting solutions.

## 2020-11-15 ENCOUNTER — Ambulatory Visit: Payer: Self-pay | Admitting: Internal Medicine

## 2020-11-30 ENCOUNTER — Other Ambulatory Visit: Payer: Self-pay | Admitting: Internal Medicine

## 2020-11-30 MED ORDER — ESTRADIOL 1 MG PO TABS: ORAL_TABLET | ORAL | 0 refills | Status: DC

## 2020-11-30 MED ORDER — AMPHETAMINE-DEXTROAMPHETAMINE 15 MG PO TABS: 15.0000 mg | ORAL_TABLET | Freq: Three times a day (TID) | ORAL | 0 refills | Status: DC

## 2020-11-30 MED ORDER — VERAPAMIL HCL 120 MG PO TABS: ORAL_TABLET | ORAL | 0 refills | Status: DC

## 2020-12-14 ENCOUNTER — Other Ambulatory Visit: Payer: Self-pay | Admitting: Internal Medicine

## 2021-02-27 ENCOUNTER — Telehealth: Payer: Self-pay | Admitting: Internal Medicine

## 2021-02-27 NOTE — Telephone Encounter (Signed)
Patient requesting refill for amphetamine-dextroamphetamine (ADDERALL) 15 MG tablet  Pharmacy [id number] - [place], [place] - [address]

## 2021-02-28 ENCOUNTER — Other Ambulatory Visit: Payer: Self-pay | Admitting: Internal Medicine

## 2021-02-28 DIAGNOSIS — F988 Other specified behavioral and emotional disorders with onset usually occurring in childhood and adolescence: Secondary | ICD-10-CM

## 2021-02-28 MED ORDER — AMPHETAMINE-DEXTROAMPHETAMINE 15 MG PO TABS: 15.0000 mg | ORAL_TABLET | Freq: Three times a day (TID) | ORAL | 0 refills | Status: DC

## 2021-03-12 NOTE — Progress Notes (Addendum)
Subjective:    Patient ID: [name], female    DOB: 09-16-1955, 65 y.o.   MRN: [id number]   This visit occurred during the SARS-CoV-2 public health emergency.  Safety protocols were in place, including screening questions prior to the visit, additional usage of staff PPE, and extensive cleaning of exam room while observing appropriate contact time as indicated for disinfecting solutions.    HPI She is here for a physical exam.   Left ear feels like there is water in it.  No pain.  Feels like something is in it.  No change in hearing.   URI in June - not covid  6 months - feels like she has a fist or a ball under right rib cage.  Lower ribs always sore.  Trouble taking deep breath.  If her intestines are full she feels discomfort in her RUQ.  If she takes a deep breath she feels pressure in her RUQ.  Pressure in chest from her underwire bra is uncomfortable and makes it harder to breath.  If she eats a full sandwich - stomach is full - she gets pressure in her RUQ.  It gets worse if constipated but takes miralax daily.      Medications and allergies reviewed with patient and updated if appropriate.  Patient Active Problem List   Diagnosis Date Noted   ETD (Eustachian tube dysfunction), left 03/13/2021   Bilateral upper abdominal discomfort 03/13/2021   Hormone replacement therapy (HRT) 02/09/2018   Incidental lung nodule RUL stable 2012-2016 - no f/u needed 01/08/2012   Eczema 01/08/2012   Attention deficit disorder 04/14/2007   Essential hypertension 12/09/2006   ALLERGIC RHINITIS 12/09/2006   IBS 12/09/2006    Current Outpatient Medications on File Prior to Visit  Medication Sig Dispense Refill   amphetamine-dextroamphetamine (ADDERALL) 15 MG tablet Take 1 tablet by mouth 3 (three) times daily. 270 tablet 0   loratadine (CLARITIN) 10 MG tablet Take 10 mg by mouth daily as needed.     omeprazole (PRILOSEC) 20 MG capsule Take 20 mg by mouth daily.     valACYclovir  (VALTREX) 1000 MG tablet Take 2 tablets (2,000 mg total) by mouth 2 (two) times daily as needed. 60 tablet 0   No current facility-administered medications on file prior to visit.    Past Medical History:  Diagnosis Date   ADD    ALLERGIC RHINITIS    GERD    HOMOCYSTINEMIA    HYPERTENSION    IBS    Incidental lung nodule 02/2011 ct   unchanged 07/16/11 CT, 6 mo f/u  planned    Past Surgical History:  Procedure Laterality Date   ABDOMINAL HYSTERECTOMY     Bladder tack  2001    Social History   Socioeconomic History   Marital status: Legally Separated    Spouse name: Not on file   Number of children: 3   Years of education: Not on file   Highest education level: Not on file  Occupational History   Occupation: Investment banker, corporate: [employer]  Tobacco Use   Smoking status: Former    Types: Cigarettes    Start date: 08/11/1976   Smokeless tobacco: Never   Tobacco comments:    Single-occassional boyfriend. 3 children-yougest son with cystic fibrosis. currently unemployed since 01/2010. Prev worked in Airline pilot, frequent travel of town  Substance and Sexual Activity   Alcohol use: Yes    Comment: a glass of winw a night  Drug use: No   Sexual activity: Not on file  Other Topics Concern   Not on file  Social History Narrative   Not on file   Social Determinants of Health   Financial Resource Strain: Not on file  Food Insecurity: Not on file  Transportation Needs: Not on file  Physical Activity: Not on file  Stress: Not on file  Social Connections: Not on file    Family History  Problem Relation Age of Onset   Coronary artery disease Mother    Hypertension Mother    Heart failure Mother    Parkinson's disease Mother    Coronary artery disease Father    Hypertension Father    Diabetes Father    Stroke Father    Coronary artery disease Maternal Aunt    Stroke Maternal Aunt    Parkinson's disease Maternal Aunt    Stroke Maternal Uncle    Stroke Paternal  Uncle    Hypertension Maternal Grandmother    Hypertension Maternal Grandfather    Colon cancer Maternal Grandfather    Hypertension Paternal Grandmother    Hypertension Paternal Grandfather    Colon cancer Paternal Grandfather    Colon cancer Cousin     Review of Systems  Constitutional:  Negative for chills and fever.  Eyes:  Negative for visual disturbance.  Respiratory:  Negative for cough, shortness of breath and wheezing.   Cardiovascular:  Negative for chest pain, palpitations and leg swelling.  Gastrointestinal:  Positive for abdominal pain (RUQ) and constipation (controlled). Negative for blood in stool, diarrhea and nausea.       No gerd  Genitourinary:  Negative for dysuria.  Musculoskeletal:  Positive for back pain (mild at times). Negative for arthralgias.       B/l lower rib discomfort  Skin:  Negative for color change and rash.  Neurological:  Negative for light-headedness and headaches.  Psychiatric/Behavioral:  Negative for decreased concentration. The patient is not nervous/anxious.       Objective:   Vitals:   03/13/21 0930  BP: 136/84  Pulse: 82  Temp: 98 F (36.7 C)  SpO2: 97%   Filed Weights   03/13/21 0930  Weight: 166 lb (75.3 kg)   Body mass index is 26.79 kg/m.  BP Readings from Last 3 Encounters:  03/13/21 136/84  03/07/20 (!) 140/88  05/27/19 (!) 140/92    Wt Readings from Last 3 Encounters:  03/13/21 166 lb (75.3 kg)  03/07/20 160 lb (72.6 kg)  05/27/19 158 lb (71.7 kg)    Depression screen [facility] 2/9 03/14/2021 05/27/2019  Decreased Interest 0 0  Down, Depressed, Hopeless 0 0  PHQ - 2 Score 0 0  Altered sleeping 1 -  Tired, decreased energy 1 -  Change in appetite 0 -  Feeling bad or failure about yourself  0 -  Trouble concentrating 0 -  Moving slowly or fidgety/restless 0 -  Suicidal thoughts 0 -  PHQ-9 Score 2 -  Difficult doing work/chores Not difficult at all -    GAD 7 : Generalized Anxiety Score 03/14/2021  Nervous,  Anxious, on Edge 0  Control/stop worrying 0  Worry too much - different things 0  Trouble relaxing 0  Restless 0  Easily annoyed or irritable 0  Afraid - awful might happen 0  Total GAD 7 Score 0  Anxiety Difficulty Not difficult at all       Physical Exam Constitutional: She appears well-developed and well-nourished. No distress.  HENT:  Head: Normocephalic and  atraumatic.  Right Ear: External ear normal. Normal ear canal and TM Left Ear: External ear normal.  Normal ear canal and TM Mouth/Throat: Oropharynx is clear and moist.  Eyes: Conjunctivae and EOM are normal.  Neck: Neck supple. No tracheal deviation present. No thyromegaly present.  No carotid bruit  Cardiovascular: Normal rate, regular rhythm and normal heart sounds.   No murmur heard.  No edema. Pulmonary/Chest: Effort normal and breath sounds normal. No respiratory distress. She has no wheezes. She has no rales.  Breast: deferred   Abdominal: Soft. She exhibits no distension. There is minimal tenderness across upper abdomen w/o rebound or guarding, no lower abdomen tenderness Lymphadenopathy: She has no cervical adenopathy.  Skin: Skin is warm and dry. She is not diaphoretic.  Psychiatric: She has a normal mood and affect. Her behavior is normal.     Lab Results  Component Value Date   WBC 4.3 03/07/2020   HGB 14.0 03/07/2020   HCT 41.4 03/07/2020   PLT 243 03/07/2020   GLUCOSE 84 03/07/2020   CHOL 188 03/07/2020   TRIG 116 03/07/2020   HDL 80 03/07/2020   LDLDIRECT 111.7 12/03/2007   LDLCALC 87 03/07/2020   ALT 21 03/07/2020   AST 23 03/07/2020   NA 140 03/07/2020   K 4.2 03/07/2020   CL 102 03/07/2020   CREATININE 0.75 03/07/2020   BUN 9 03/07/2020   CO2 31 03/07/2020   TSH 1.56 12/14/2014   INR 1.0 12/06/2008         Assessment & Plan:   Physical exam: Screening blood work  ordered Exercise  very active Weight  good for age Substance abuse  none   Screened for depression using the  PHQ 9 scale.  No evidence of depression.   Screened for anxiety using GAD7 Scale.  No evidence of anxiety.   Reviewed recommended immunizations.   Health Maintenance  Topic Date Due   PAP SMEAR-Modifier  06/26/2017   INFLUENZA VACCINE  03/11/2021   COVID-19 Vaccine (2 - Pfizer series) 03/29/2021 (Originally 12/22/2019)   Zoster Vaccines- Shingrix (1 of 2) 06/13/2021 (Originally 01/04/2008)   MAMMOGRAM  03/13/2022 (Originally 08/10/2012)   COLONOSCOPY (Pts 45-77yrs Insurance coverage will need to be confirmed)  03/13/2022 (Originally 02/28/2016)   TETANUS/TDAP  05/26/2029   Pneumococcal Vaccine 66-60 Years old  Aged Out   HPV VACCINES  Aged Out   Hepatitis C Screening  Discontinued   HIV Screening  Discontinued          See Problem List for Assessment and Plan of chronic medical problems.

## 2021-03-12 NOTE — Patient Instructions (Addendum)
Blood work was ordered.     Medications changes include :  none   Your prescription(s) have been submitted to your pharmacy. Please take as directed and contact our office if you believe you are having problem(s) with the medication(s).   Please followup in 1 year   Health Maintenance, Female Adopting a healthy lifestyle and getting preventive care are important in promoting health and wellness. Ask your health care provider about: The right schedule for you to have regular tests and exams. Things you can do on your own to prevent diseases and keep yourself healthy. What should I know about diet, weight, and exercise? Eat a healthy diet  Eat a diet that includes plenty of vegetables, fruits, low-fat dairy products, and lean protein. Do not eat a lot of foods that are high in solid fats, added sugars, or sodium.  Maintain a healthy weight Body mass index (BMI) is used to identify weight problems. It estimates body fat based on height and weight. Your health care provider can help determineyour BMI and help you achieve or maintain a healthy weight. Get regular exercise Get regular exercise. This is one of the most important things you can do for your health. Most adults should: Exercise for at least 150 minutes each week. The exercise should increase your heart rate and make you sweat (moderate-intensity exercise). Do strengthening exercises at least twice a week. This is in addition to the moderate-intensity exercise. Spend less time sitting. Even light physical activity can be beneficial. Watch cholesterol and blood lipids Have your blood tested for lipids and cholesterol at 63 years of age, then havethis test every 5 years. Have your cholesterol levels checked more often if: Your lipid or cholesterol levels are high. You are older than 63 years of age. You are at high risk for heart disease. What should I know about cancer screening? Depending on your health history and family  history, you may need to have cancer screening at various ages. This may include screening for: Breast cancer. Cervical cancer. Colorectal cancer. Skin cancer. Lung cancer. What should I know about heart disease, diabetes, and high blood pressure? Blood pressure and heart disease High blood pressure causes heart disease and increases the risk of stroke. This is more likely to develop in people who have high blood pressure readings, are of African descent, or are overweight. Have your blood pressure checked: Every 3-5 years if you are 18-39 years of age. Every year if you are 40 years old or older. Diabetes Have regular diabetes screenings. This checks your fasting blood sugar level. Have the screening done: Once every three years after age 40 if you are at a normal weight and have a low risk for diabetes. More often and at a younger age if you are overweight or have a high risk for diabetes. What should I know about preventing infection? Hepatitis B If you have a higher risk for hepatitis B, you should be screened for this virus. Talk with your health care provider to find out if you are at risk forhepatitis B infection. Hepatitis C Testing is recommended for: Everyone born from 1945 through 1965. Anyone with known risk factors for hepatitis C. Sexually transmitted infections (STIs) Get screened for STIs, including gonorrhea and chlamydia, if: You are sexually active and are younger than 63 years of age. You are older than 63 years of age and your health care provider tells you that you are at risk for this type of infection. Your sexual activity has   changed since you were last screened, and you are at increased risk for chlamydia or gonorrhea. Ask your health care provider if you are at risk. Ask your health care provider about whether you are at high risk for HIV. Your health care provider may recommend a prescription medicine to help prevent HIV infection. If you choose to take  medicine to prevent HIV, you should first get tested for HIV. You should then be tested every 3 months for as long as you are taking the medicine. Pregnancy If you are about to stop having your period (premenopausal) and you may become pregnant, seek counseling before you get pregnant. Take 400 to 800 micrograms (mcg) of folic acid every day if you become pregnant. Ask for birth control (contraception) if you want to prevent pregnancy. Osteoporosis and menopause Osteoporosis is a disease in which the bones lose minerals and strength with aging. This can result in bone fractures. If you are 65 years old or older, or if you are at risk for osteoporosis and fractures, ask your health care provider if you should: Be screened for bone loss. Take a calcium or vitamin D supplement to lower your risk of fractures. Be given hormone replacement therapy (HRT) to treat symptoms of menopause. Follow these instructions at home: Lifestyle Do not use any products that contain nicotine or tobacco, such as cigarettes, e-cigarettes, and chewing tobacco. If you need help quitting, ask your health care provider. Do not use street drugs. Do not share needles. Ask your health care provider for help if you need support or information about quitting drugs. Alcohol use Do not drink alcohol if: Your health care provider tells you not to drink. You are pregnant, may be pregnant, or are planning to become pregnant. If you drink alcohol: Limit how much you use to 0-1 drink a day. Limit intake if you are breastfeeding. Be aware of how much alcohol is in your drink. In the [place] one drink equals one 12 oz bottle of beer (355 mL), one 5 oz glass of wine (148 mL), or one 1 oz glass of hard liquor (44 mL). General instructions Schedule regular health, dental, and eye exams. Stay current with your vaccines. Tell your health care provider if: You often feel depressed. You have ever been abused or do not feel safe at  home. Summary Adopting a healthy lifestyle and getting preventive care are important in promoting health and wellness. Follow your health care provider's instructions about healthy diet, exercising, and getting tested or screened for diseases. Follow your health care provider's instructions on monitoring your cholesterol and blood pressure. This information is not intended to replace advice given to you by your health care provider. Make sure you discuss any questions you have with your healthcare provider. Document Revised: 07/21/2018 Document Reviewed: 07/21/2018 Elsevier Patient Education  2022 Elsevier Inc.  

## 2021-03-13 ENCOUNTER — Ambulatory Visit (INDEPENDENT_AMBULATORY_CARE_PROVIDER_SITE_OTHER): Payer: Self-pay | Admitting: Internal Medicine

## 2021-03-13 ENCOUNTER — Other Ambulatory Visit: Payer: Self-pay

## 2021-03-13 ENCOUNTER — Encounter: Payer: Self-pay | Admitting: Internal Medicine

## 2021-03-13 VITALS — BP 136/84 | HR 82 | Temp 98.0°F | Ht 66.0 in | Wt 166.0 lb

## 2021-03-13 DIAGNOSIS — R1012 Left upper quadrant pain: Secondary | ICD-10-CM

## 2021-03-13 DIAGNOSIS — R1011 Right upper quadrant pain: Secondary | ICD-10-CM

## 2021-03-13 DIAGNOSIS — F988 Other specified behavioral and emotional disorders with onset usually occurring in childhood and adolescence: Secondary | ICD-10-CM

## 2021-03-13 DIAGNOSIS — Z0000 Encounter for general adult medical examination without abnormal findings: Secondary | ICD-10-CM

## 2021-03-13 DIAGNOSIS — H6982 Other specified disorders of Eustachian tube, left ear: Secondary | ICD-10-CM | POA: Insufficient documentation

## 2021-03-13 DIAGNOSIS — Z79890 Hormone replacement therapy (postmenopausal): Secondary | ICD-10-CM

## 2021-03-13 DIAGNOSIS — I10 Essential (primary) hypertension: Secondary | ICD-10-CM

## 2021-03-13 DIAGNOSIS — Z1331 Encounter for screening for depression: Secondary | ICD-10-CM

## 2021-03-13 MED ORDER — VERAPAMIL HCL 120 MG PO TABS: ORAL_TABLET | ORAL | 3 refills | Status: DC

## 2021-03-13 MED ORDER — POLYETHYLENE GLYCOL 3350 17 GM/SCOOP PO POWD: 17.0000 g | Freq: Two times a day (BID) | ORAL | 1 refills | Status: AC | PRN

## 2021-03-13 MED ORDER — TRIAMTERENE-HCTZ 37.5-25 MG PO TABS: 1.0000 | ORAL_TABLET | Freq: Every day | ORAL | 3 refills | Status: DC

## 2021-03-13 MED ORDER — ESTRADIOL 1 MG PO TABS: ORAL_TABLET | ORAL | 3 refills | Status: DC

## 2021-03-13 NOTE — Assessment & Plan Note (Signed)
Chronic BP well controlled Continue verapamil 120 mg qd, maxzide 37.5-25 mg qd cmp

## 2021-03-13 NOTE — Assessment & Plan Note (Signed)
Chronic Continue estrace 1 mg qd

## 2021-03-13 NOTE — Assessment & Plan Note (Signed)
Chronic Controlled, stable Continue Adderall 15 mg tid

## 2021-03-13 NOTE — Assessment & Plan Note (Signed)
Acute Symptoms in left ear likely related to ETD Exam is normal Advised trying flonase nasal spray

## 2021-03-13 NOTE — Assessment & Plan Note (Signed)
Acute Lower b/l rib and upper abdomen discomfort - worse with constipation, eating full meal She knows she has gained some weight and it is all in her stomach and that may be contributing No nausea, gerd, not related to specific foods ? Hiatal hernia, GB dz, atypical GERD/stomach dz, outlet obstruction Cbc, cmp Deferred imaging and referral at this time - she will work on weight loss and if no improvement will need imaging and possible GI referral

## 2021-06-03 ENCOUNTER — Other Ambulatory Visit: Payer: Self-pay | Admitting: Internal Medicine

## 2021-06-03 DIAGNOSIS — F988 Other specified behavioral and emotional disorders with onset usually occurring in childhood and adolescence: Secondary | ICD-10-CM

## 2021-06-03 MED ORDER — AMPHETAMINE-DEXTROAMPHETAMINE 15 MG PO TABS: 15.0000 mg | ORAL_TABLET | Freq: Three times a day (TID) | ORAL | 0 refills | Status: DC

## 2021-07-02 ENCOUNTER — Other Ambulatory Visit: Payer: Self-pay | Admitting: Internal Medicine

## 2021-07-02 DIAGNOSIS — F988 Other specified behavioral and emotional disorders with onset usually occurring in childhood and adolescence: Secondary | ICD-10-CM

## 2021-07-02 MED ORDER — AMPHETAMINE-DEXTROAMPHETAMINE 15 MG PO TABS: 15.0000 mg | ORAL_TABLET | Freq: Three times a day (TID) | ORAL | 0 refills | Status: DC

## 2021-07-03 ENCOUNTER — Telehealth: Payer: Self-pay | Admitting: Internal Medicine

## 2021-07-03 DIAGNOSIS — F988 Other specified behavioral and emotional disorders with onset usually occurring in childhood and adolescence: Secondary | ICD-10-CM

## 2021-07-03 MED ORDER — AMPHETAMINE-DEXTROAMPHETAMINE 15 MG PO TABS: 15.0000 mg | ORAL_TABLET | Freq: Three times a day (TID) | ORAL | 0 refills | Status: DC

## 2021-07-03 NOTE — Telephone Encounter (Signed)
Patient calling in  Original rx for amphetamine-dextroamphetamine (ADDERALL) 15 MG tablet was sent to [facility] but it is currently on backorder  Would like new rx sent to   [id number] [address], [place] - [address]  Phone:  [phone] Fax:  [phone]

## 2021-08-01 ENCOUNTER — Other Ambulatory Visit: Payer: Self-pay | Admitting: Internal Medicine

## 2021-08-01 DIAGNOSIS — F988 Other specified behavioral and emotional disorders with onset usually occurring in childhood and adolescence: Secondary | ICD-10-CM

## 2021-08-01 NOTE — Telephone Encounter (Signed)
Patient calling in  Patient is requesting that a paper rx be written for med  Patient says she has been having trouble locating the med in pharmacies  Please let patient know when rx has been written & ready for pick up upfront [phone]

## 2021-08-02 MED ORDER — AMPHETAMINE-DEXTROAMPHETAMINE 15 MG PO TABS: 15.0000 mg | ORAL_TABLET | Freq: Three times a day (TID) | ORAL | 0 refills | Status: DC

## 2021-08-02 NOTE — Telephone Encounter (Signed)
Patient calling in to check status of refill request  Please let patient know if able to send to pharmacy today

## 2021-08-02 NOTE — Telephone Encounter (Signed)
Adderall 15 mg already done per Dr Yetta Barre

## 2021-09-04 ENCOUNTER — Other Ambulatory Visit: Payer: Self-pay | Admitting: Internal Medicine

## 2021-09-04 DIAGNOSIS — F988 Other specified behavioral and emotional disorders with onset usually occurring in childhood and adolescence: Secondary | ICD-10-CM

## 2021-09-04 MED ORDER — AMPHETAMINE-DEXTROAMPHETAMINE 15 MG PO TABS: 15.0000 mg | ORAL_TABLET | Freq: Three times a day (TID) | ORAL | 0 refills | Status: DC

## 2021-10-02 ENCOUNTER — Other Ambulatory Visit: Payer: Self-pay | Admitting: Internal Medicine

## 2021-10-02 DIAGNOSIS — F988 Other specified behavioral and emotional disorders with onset usually occurring in childhood and adolescence: Secondary | ICD-10-CM

## 2021-10-02 MED ORDER — AMPHETAMINE-DEXTROAMPHETAMINE 15 MG PO TABS: 15.0000 mg | ORAL_TABLET | Freq: Three times a day (TID) | ORAL | 0 refills | Status: DC

## 2021-10-09 ENCOUNTER — Telehealth: Payer: Self-pay | Admitting: Emergency Medicine

## 2021-10-09 DIAGNOSIS — J019 Acute sinusitis, unspecified: Secondary | ICD-10-CM

## 2021-10-09 DIAGNOSIS — B9689 Other specified bacterial agents as the cause of diseases classified elsewhere: Secondary | ICD-10-CM

## 2021-10-09 MED ORDER — AMOXICILLIN-POT CLAVULANATE 875-125 MG PO TABS: 1.0000 | ORAL_TABLET | Freq: Two times a day (BID) | ORAL | 0 refills | Status: DC

## 2021-10-09 NOTE — Progress Notes (Signed)
?Virtual Visit Consent  ? ?[name], you are scheduled for a virtual visit with a [facility] provider today.   ?  ?Just as with appointments in the office, your consent must be obtained to participate.  Your consent will be active for this visit and any virtual visit you may have with one of our providers in the next 365 days.   ?  ?If you have a MyChart account, a copy of this consent can be sent to you electronically.  All virtual visits are billed to your insurance company just like a traditional visit in the office.   ? ?As this is a virtual visit, video technology does not allow for your provider to perform a traditional examination.  This may limit your provider's ability to fully assess your condition.  If your provider identifies any concerns that need to be evaluated in person or the need to arrange testing (such as labs, EKG, etc.), we will make arrangements to do so.   ?  ?Although advances in technology are sophisticated, we cannot ensure that it will always work on either your end or our end.  If the connection with a video visit is poor, the visit may have to be switched to a telephone visit.  With either a video or telephone visit, we are not always able to ensure that we have a secure connection.    ? ?I need to obtain your verbal consent now.   Are you willing to proceed with your visit today?  ?  ?[name] has provided verbal consent on 10/09/2021 for a virtual visit (video or telephone). ?  ?Carvel Getting, NP  ? ?Date: 10/09/2021 3:33 PM ? ? ?Virtual Visit via Video Note  ? ?[name], connected with  [name]  ([id number], [birth year]) on 10/09/21 at  3:30 PM EST by a video-enabled telemedicine application and verified that I am speaking with the correct person using two identifiers. ? ?Location: ?Patient: Virtual Visit Location Patient: Home ?Provider: Virtual Visit Location Provider: Home Office ?  ?I discussed the limitations of evaluation and management by  telemedicine and the availability of in person appointments. The patient expressed understanding and agreed to proceed.   ? ?History of Present Illness: ?[name] is a 66 y.o. who identifies as a female who was assigned female at birth, and is being seen today for a sinus infection.  She has been sick for about 1-1/2 weeks with what she thought was originally allergies.  She has significant nasal congestion and postnasal drainage.  She reports the drainage is yellow-green and thick.  She reports she has bilateral maxillary sinus pressure and her teeth hurt.  She has been treating her symptoms with using her loratadine, and occasional Benadryl, and ibuprofen.  She is also been using a Neti pot daily to try to help relieve her congestion.  She denies fever. ? ?HPI: HPI  ?Problems:  ?Patient Active Problem List  ? Diagnosis Date Noted  ? ETD (Eustachian tube dysfunction), left 03/13/2021  ? Bilateral upper abdominal discomfort 03/13/2021  ? Hormone replacement therapy (HRT) 02/09/2018  ? Incidental lung nodule RUL stable 2012-2016 - no f/u needed 01/08/2012  ? Eczema 01/08/2012  ? Attention deficit disorder 04/14/2007  ? Essential hypertension 12/09/2006  ? ALLERGIC RHINITIS 12/09/2006  ? IBS 12/09/2006  ?  ?Allergies:  ?Allergies  ?Allergen Reactions  ? Nifedipine   ?  REACTION: increased HR  ? Shellfish Allergy   ? ?Medications:  ?  Current Outpatient Medications:  ?  amoxicillin-clavulanate (AUGMENTIN) 875-125 MG tablet, Take 1 tablet by mouth 2 (two) times daily., Disp: 14 tablet, Rfl: 0 ?  amphetamine-dextroamphetamine (ADDERALL) 15 MG tablet, Take 1 tablet by mouth 3 (three) times daily., Disp: 90 tablet, Rfl: 0 ?  estradiol (ESTRACE) 1 MG tablet, TAKE 1 TABLET BY MOUTH EVERY DAY, Disp: 90 tablet, Rfl: 3 ?  loratadine (CLARITIN) 10 MG tablet, Take 10 mg by mouth daily as needed., Disp: , Rfl:  ?  omeprazole (PRILOSEC) 20 MG capsule, Take 20 mg by mouth daily., Disp: , Rfl:  ?  polyethylene glycol powder  (GLYCOLAX/MIRALAX) 17 GM/SCOOP powder, Take 17 g by mouth 2 (two) times daily as needed., Disp: 3350 g, Rfl: 1 ?  triamterene-hydrochlorothiazide (MAXZIDE-25) 37.5-25 MG tablet, Take 1 tablet by mouth daily., Disp: 90 tablet, Rfl: 3 ?  valACYclovir (VALTREX) 1000 MG tablet, Take 2 tablets (2,000 mg total) by mouth 2 (two) times daily as needed., Disp: 60 tablet, Rfl: 0 ?  verapamil (CALAN) 120 MG tablet, TAKE 1 TABLET(120 MG) BY MOUTH THREE TIMES DAILY, Disp: 270 tablet, Rfl: 3 ? ?Observations/Objective: ?Patient is well-developed, well-nourished in no acute distress.  ?Resting comfortably  at home.  ?Head is normocephalic, atraumatic.  ?No labored breathing.  ?Speech is clear and coherent with logical content.  ?Patient is alert and oriented at baseline.  ? ? ?Assessment and Plan: ?1. Acute bacterial sinusitis ? ?I prescribed Augmentin.  I recommended Mucinex.  Patient to continue Nettie pot. ? ?Follow Up Instructions: ?I discussed the assessment and treatment plan with the patient. The patient was provided an opportunity to ask questions and all were answered. The patient agreed with the plan and demonstrated an understanding of the instructions.  A copy of instructions were sent to the patient via MyChart unless otherwise noted below.  ? ?The patient was advised to call back or seek an in-person evaluation if the symptoms worsen or if the condition fails to improve as anticipated. ? ?Time:  ?I spent 10 minutes with the patient via telehealth technology discussing the above problems/concerns.   ? ?Carvel Getting, NP ?

## 2021-10-09 NOTE — Patient Instructions (Signed)
?  [name], thank you for joining Cathlyn Parsons, NP for today's virtual visit.  While this provider is not your primary care provider (PCP), if your PCP is located in our provider database this encounter information will be shared with them immediately following your visit. ? ?Consent: ?(Patient) [name] provided verbal consent for this virtual visit at the beginning of the encounter. ? ?Current Medications: ? ?Current Outpatient Medications:  ?  amoxicillin-clavulanate (AUGMENTIN) 875-125 MG tablet, Take 1 tablet by mouth 2 (two) times daily., Disp: 14 tablet, Rfl: 0 ?  amphetamine-dextroamphetamine (ADDERALL) 15 MG tablet, Take 1 tablet by mouth 3 (three) times daily., Disp: 90 tablet, Rfl: 0 ?  estradiol (ESTRACE) 1 MG tablet, TAKE 1 TABLET BY MOUTH EVERY DAY, Disp: 90 tablet, Rfl: 3 ?  loratadine (CLARITIN) 10 MG tablet, Take 10 mg by mouth daily as needed., Disp: , Rfl:  ?  omeprazole (PRILOSEC) 20 MG capsule, Take 20 mg by mouth daily., Disp: , Rfl:  ?  polyethylene glycol powder (GLYCOLAX/MIRALAX) 17 GM/SCOOP powder, Take 17 g by mouth 2 (two) times daily as needed., Disp: 3350 g, Rfl: 1 ?  triamterene-hydrochlorothiazide (MAXZIDE-25) 37.5-25 MG tablet, Take 1 tablet by mouth daily., Disp: 90 tablet, Rfl: 3 ?  valACYclovir (VALTREX) 1000 MG tablet, Take 2 tablets (2,000 mg total) by mouth 2 (two) times daily as needed., Disp: 60 tablet, Rfl: 0 ?  verapamil (CALAN) 120 MG tablet, TAKE 1 TABLET(120 MG) BY MOUTH THREE TIMES DAILY, Disp: 270 tablet, Rfl: 3  ? ?Medications ordered in this encounter:  ?Meds ordered this encounter  ?Medications  ? amoxicillin-clavulanate (AUGMENTIN) 875-125 MG tablet  ?  Sig: Take 1 tablet by mouth 2 (two) times daily.  ?  Dispense:  14 tablet  ?  Refill:  0  ?  ? ?*If you need refills on other medications prior to your next appointment, please contact your pharmacy* ? ?Follow-Up: ?Call back or seek an in-person evaluation if the symptoms worsen or if the condition  fails to improve as anticipated. ? ?Other Instructions ?Continue using the Neti pot 2-3 times a day.  I suggest trying Mucinex to help thin out the secretions.  Finish all of the antibiotics as prescribed. ? ? ?If you have been instructed to have an in-person evaluation today at a local Urgent Care facility, please use the link below. It will take you to a list of all of our available [facility], including address, phone number and hours of operation. Please do not delay care.  ?[facility] ? ?If you or a family member do not have a primary care provider, use the link below to schedule a visit and establish care. When you choose a [facility] primary care physician or advanced practice provider, you gain a long-term partner in health. ?Find a Primary Care Provider ? ?Learn more about [facility]'s in-office and virtual care options: ?[facility] Get Care Now  ?

## 2021-10-20 ENCOUNTER — Encounter: Payer: Self-pay | Admitting: Internal Medicine

## 2021-10-21 ENCOUNTER — Other Ambulatory Visit: Payer: Self-pay

## 2021-10-21 MED ORDER — VERAPAMIL HCL 120 MG PO TABS: ORAL_TABLET | ORAL | 3 refills | Status: DC

## 2021-10-21 MED ORDER — VERAPAMIL HCL 120 MG PO TABS
ORAL_TABLET | ORAL | 3 refills | Status: DC
Start: 2021-10-21 — End: 2023-02-25

## 2021-11-01 ENCOUNTER — Other Ambulatory Visit: Payer: Self-pay | Admitting: Internal Medicine

## 2021-11-01 DIAGNOSIS — F988 Other specified behavioral and emotional disorders with onset usually occurring in childhood and adolescence: Secondary | ICD-10-CM

## 2021-11-01 MED ORDER — AMPHETAMINE-DEXTROAMPHETAMINE 15 MG PO TABS: 15.0000 mg | ORAL_TABLET | Freq: Three times a day (TID) | ORAL | 0 refills | Status: DC

## 2021-11-29 ENCOUNTER — Other Ambulatory Visit: Payer: Self-pay | Admitting: Internal Medicine

## 2021-11-29 DIAGNOSIS — F988 Other specified behavioral and emotional disorders with onset usually occurring in childhood and adolescence: Secondary | ICD-10-CM

## 2021-11-29 MED ORDER — AMPHETAMINE-DEXTROAMPHETAMINE 15 MG PO TABS: 15.0000 mg | ORAL_TABLET | Freq: Three times a day (TID) | ORAL | 0 refills | Status: DC

## 2021-12-27 ENCOUNTER — Other Ambulatory Visit: Payer: Self-pay | Admitting: Internal Medicine

## 2021-12-27 DIAGNOSIS — F988 Other specified behavioral and emotional disorders with onset usually occurring in childhood and adolescence: Secondary | ICD-10-CM

## 2021-12-27 MED ORDER — AMPHETAMINE-DEXTROAMPHETAMINE 15 MG PO TABS: 15.0000 mg | ORAL_TABLET | Freq: Three times a day (TID) | ORAL | 0 refills | Status: DC

## 2022-01-07 ENCOUNTER — Telehealth: Payer: Self-pay | Admitting: Physician Assistant

## 2022-01-07 DIAGNOSIS — J0100 Acute maxillary sinusitis, unspecified: Secondary | ICD-10-CM

## 2022-01-07 MED ORDER — AMOXICILLIN-POT CLAVULANATE 875-125 MG PO TABS: 1.0000 | ORAL_TABLET | Freq: Two times a day (BID) | ORAL | 0 refills | Status: DC

## 2022-01-07 NOTE — Patient Instructions (Signed)
[name], thank you for joining Leeanne Rio, PA-C for today's virtual visit.  While this provider is not your primary care provider (PCP), if your PCP is located in our provider database this encounter information will be shared with them immediately following your visit.  Consent: (Patient) [name] provided verbal consent for this virtual visit at the beginning of the encounter.  Current Medications:  Current Outpatient Medications:    amoxicillin-clavulanate (AUGMENTIN) 875-125 MG tablet, Take 1 tablet by mouth 2 (two) times daily., Disp: 14 tablet, Rfl: 0   amphetamine-dextroamphetamine (ADDERALL) 15 MG tablet, Take 1 tablet by mouth 3 (three) times daily., Disp: 90 tablet, Rfl: 0   estradiol (ESTRACE) 1 MG tablet, TAKE 1 TABLET BY MOUTH EVERY DAY, Disp: 90 tablet, Rfl: 3   loratadine (CLARITIN) 10 MG tablet, Take 10 mg by mouth daily as needed., Disp: , Rfl:    omeprazole (PRILOSEC) 20 MG capsule, Take 20 mg by mouth daily., Disp: , Rfl:    polyethylene glycol powder (GLYCOLAX/MIRALAX) 17 GM/SCOOP powder, Take 17 g by mouth 2 (two) times daily as needed., Disp: 3350 g, Rfl: 1   triamterene-hydrochlorothiazide (MAXZIDE-25) 37.5-25 MG tablet, Take 1 tablet by mouth daily., Disp: 90 tablet, Rfl: 3   valACYclovir (VALTREX) 1000 MG tablet, Take 2 tablets (2,000 mg total) by mouth 2 (two) times daily as needed., Disp: 60 tablet, Rfl: 0   verapamil (CALAN) 120 MG tablet, TAKE 1 TABLET(120 MG) BY MOUTH THREE TIMES DAILY, Disp: 270 tablet, Rfl: 3   Medications ordered in this encounter:  No orders of the defined types were placed in this encounter.    *If you need refills on other medications prior to your next appointment, please contact your pharmacy*  Follow-Up: Call back or seek an in-person evaluation if the symptoms worsen or if the condition fails to improve as anticipated.  Other Instructions Please take antibiotic as directed.  Increase fluid intake.  Use  Saline nasal spray.  Take a daily multivitamin. Restart an OTC nasal steroid -- .  Place a humidifier in the bedroom.  Please call or return clinic if symptoms are not improving.  Sinusitis Sinusitis is redness, soreness, and swelling (inflammation) of the paranasal sinuses. Paranasal sinuses are air pockets within the bones of your face (beneath the eyes, the middle of the forehead, or above the eyes). In healthy paranasal sinuses, mucus is able to drain out, and air is able to circulate through them by way of your nose. However, when your paranasal sinuses are inflamed, mucus and air can become trapped. This can allow bacteria and other germs to grow and cause infection. Sinusitis can develop quickly and last only a short time (acute) or continue over a long period (chronic). Sinusitis that lasts for more than 12 weeks is considered chronic.  CAUSES  Causes of sinusitis include: Allergies. Structural abnormalities, such as displacement of the cartilage that separates your nostrils (deviated septum), which can decrease the air flow through your nose and sinuses and affect sinus drainage. Functional abnormalities, such as when the small hairs (cilia) that line your sinuses and help remove mucus do not work properly or are not present. SYMPTOMS  Symptoms of acute and chronic sinusitis are the same. The primary symptoms are pain and pressure around the affected sinuses. Other symptoms include: Upper toothache. Earache. Headache. Bad breath. Decreased sense of smell and taste. A cough, which worsens when you are lying flat. Fatigue. Fever. Thick drainage from your nose, which often is  green and may contain pus (purulent). Swelling and warmth over the affected sinuses. DIAGNOSIS  Your caregiver will perform a physical exam. During the exam, your caregiver may: Look in your nose for signs of abnormal growths in your nostrils (nasal polyps). Tap over the affected sinus to check for signs of  infection. View the inside of your sinuses (endoscopy) with a special imaging device with a light attached (endoscope), which is inserted into your sinuses. If your caregiver suspects that you have chronic sinusitis, one or more of the following tests may be recommended: Allergy tests. Nasal culture A sample of mucus is taken from your nose and sent to a lab and screened for bacteria. Nasal cytology A sample of mucus is taken from your nose and examined by your caregiver to determine if your sinusitis is related to an allergy. TREATMENT  Most cases of acute sinusitis are related to a viral infection and will resolve on their own within 10 days. Sometimes medicines are prescribed to help relieve symptoms (pain medicine, decongestants, nasal steroid sprays, or saline sprays).  However, for sinusitis related to a bacterial infection, your caregiver will prescribe antibiotic medicines. These are medicines that will help kill the bacteria causing the infection.  Rarely, sinusitis is caused by a fungal infection. In theses cases, your caregiver will prescribe antifungal medicine. For some cases of chronic sinusitis, surgery is needed. Generally, these are cases in which sinusitis recurs more than 3 times per year, despite other treatments. HOME CARE INSTRUCTIONS  Drink plenty of water. Water helps thin the mucus so your sinuses can drain more easily. Use a humidifier. Inhale steam 3 to 4 times a day (for example, sit in the bathroom with the shower running). Apply a warm, moist washcloth to your face 3 to 4 times a day, or as directed by your caregiver. Use saline nasal sprays to help moisten and clean your sinuses. Take over-the-counter or prescription medicines for pain, discomfort, or fever only as directed by your caregiver. SEEK IMMEDIATE MEDICAL CARE IF: You have increasing pain or severe headaches. You have nausea, vomiting, or drowsiness. You have swelling around your face. You have vision  problems. You have a stiff neck. You have difficulty breathing. MAKE SURE YOU:  Understand these instructions. Will watch your condition. Will get help right away if you are not doing well or get worse. Document Released: 07/28/2005 Document Revised: 10/20/2011 Document Reviewed: 08/12/2011 [facility] Patient Information 2014 [place], [place].    If you have been instructed to have an in-person evaluation today at a local Urgent Care facility, please use the link below. It will take you to a list of all of our available [facility], including address, phone number and hours of operation. Please do not delay care.  [facility]  If you or a family member do not have a primary care provider, use the link below to schedule a visit and establish care. When you choose a [facility] primary care physician or advanced practice provider, you gain a long-term partner in health. Find a Primary Care Provider  Learn more about [facility]'s in-office and virtual care options: [facility]

## 2022-01-07 NOTE — Progress Notes (Signed)
Virtual Visit Consent   [name], you are scheduled for a virtual visit with a [facility] provider today. Just as with appointments in the office, your consent must be obtained to participate. Your consent will be active for this visit and any virtual visit you may have with one of our providers in the next 365 days. If you have a MyChart account, a copy of this consent can be sent to you electronically.  As this is a virtual visit, video technology does not allow for your provider to perform a traditional examination. This may limit your provider's ability to fully assess your condition. If your provider identifies any concerns that need to be evaluated in person or the need to arrange testing (such as labs, EKG, etc.), we will make arrangements to do so. Although advances in technology are sophisticated, we cannot ensure that it will always work on either your end or our end. If the connection with a video visit is poor, the visit may have to be switched to a telephone visit. With either a video or telephone visit, we are not always able to ensure that we have a secure connection.  By engaging in this virtual visit, you consent to the provision of healthcare and authorize for your insurance to be billed (if applicable) for the services provided during this visit. Depending on your insurance coverage, you may receive a charge related to this service.  I need to obtain your verbal consent now. Are you willing to proceed with your visit today? [name] has provided verbal consent on 01/07/2022 for a virtual visit (video or telephone). Piedad Climes, [place]  Date: 01/07/2022 8:31 AM  Virtual Visit via Video Note   I, Piedad Climes, connected with  [name]  ([id number], [birth year]) on 01/07/22 at  8:30 AM EDT by a video-enabled telemedicine application and verified that I am speaking with the correct person using two identifiers.  Location: Patient: Virtual Visit Location  Patient: Home Provider: Virtual Visit Location Provider: Home Office   I discussed the limitations of evaluation and management by telemedicine and the availability of in person appointments. The patient expressed understanding and agreed to proceed.    History of Present Illness: [name] is a 66 y.o. who identifies as a female who was assigned female at birth, and is being seen today for possible sinusitis. Endorses symptoms starting last week with head and sinus congestion with sore throat, now progressing with more sinus pressure, upper tooth pain and facial pain. Denies fever, chills, aches. Denies any significant chest congestion but with cough.   Is staying hydrated.    HPI: HPI  Problems:  Patient Active Problem List   Diagnosis Date Noted   ETD (Eustachian tube dysfunction), left 03/13/2021   Bilateral upper abdominal discomfort 03/13/2021   Hormone replacement therapy (HRT) 02/09/2018   Incidental lung nodule RUL stable 2012-2016 - no f/u needed 01/08/2012   Eczema 01/08/2012   Attention deficit disorder 04/14/2007   Essential hypertension 12/09/2006   ALLERGIC RHINITIS 12/09/2006   IBS 12/09/2006    Allergies:  Allergies  Allergen Reactions   Nifedipine     REACTION: increased HR   Shellfish Allergy    Medications:  Current Outpatient Medications:    amoxicillin-clavulanate (AUGMENTIN) 875-125 MG tablet, Take 1 tablet by mouth 2 (two) times daily., Disp: 14 tablet, Rfl: 0   amphetamine-dextroamphetamine (ADDERALL) 15 MG tablet, Take 1 tablet by mouth 3 (three) times daily., Disp: 90 tablet,  Rfl: 0   estradiol (ESTRACE) 1 MG tablet, TAKE 1 TABLET BY MOUTH EVERY DAY, Disp: 90 tablet, Rfl: 3   loratadine (CLARITIN) 10 MG tablet, Take 10 mg by mouth daily as needed., Disp: , Rfl:    omeprazole (PRILOSEC) 20 MG capsule, Take 20 mg by mouth daily., Disp: , Rfl:    polyethylene glycol powder (GLYCOLAX/MIRALAX) 17 GM/SCOOP powder, Take 17 g by mouth 2 (two) times  daily as needed., Disp: 3350 g, Rfl: 1   triamterene-hydrochlorothiazide (MAXZIDE-25) 37.5-25 MG tablet, Take 1 tablet by mouth daily., Disp: 90 tablet, Rfl: 3   valACYclovir (VALTREX) 1000 MG tablet, Take 2 tablets (2,000 mg total) by mouth 2 (two) times daily as needed., Disp: 60 tablet, Rfl: 0   verapamil (CALAN) 120 MG tablet, TAKE 1 TABLET(120 MG) BY MOUTH THREE TIMES DAILY, Disp: 270 tablet, Rfl: 3  Observations/Objective: Patient is well-developed, well-nourished in no acute distress.  Resting comfortably at home.  Head is normocephalic, atraumatic.  No labored breathing. Speech is clear and coherent with logical content.  Patient is alert and oriented at baseline.  Assessment and Plan: 1. Acute non-recurrent maxillary sinusitis - amoxicillin-clavulanate (AUGMENTIN) 875-125 MG tablet; Take 1 tablet by mouth 2 (two) times daily.  Dispense: 14 tablet; Refill: 0  Rx Augmentin.  Increase fluids.  Rest.  Saline nasal spray.  Probiotic.  Mucinex as directed.  Humidifier in bedroom. Start Flonase or Nasacort OTC.  Call or return to clinic if symptoms are not improving.   Follow Up Instructions: I discussed the assessment and treatment plan with the patient. The patient was provided an opportunity to ask questions and all were answered. The patient agreed with the plan and demonstrated an understanding of the instructions.  A copy of instructions were sent to the patient via MyChart unless otherwise noted below.   The patient was advised to call back or seek an in-person evaluation if the symptoms worsen or if the condition fails to improve as anticipated.  Time:  I spent 10 minutes with the patient via telehealth technology discussing the above problems/concerns.    Piedad Climes, PA-C

## 2022-01-25 ENCOUNTER — Other Ambulatory Visit: Payer: Self-pay | Admitting: Internal Medicine

## 2022-01-25 DIAGNOSIS — F988 Other specified behavioral and emotional disorders with onset usually occurring in childhood and adolescence: Secondary | ICD-10-CM

## 2022-01-27 MED ORDER — AMPHETAMINE-DEXTROAMPHETAMINE 15 MG PO TABS: 15.0000 mg | ORAL_TABLET | Freq: Three times a day (TID) | ORAL | 0 refills | Status: DC

## 2022-01-27 NOTE — Telephone Encounter (Signed)
Check [place] registry last filled 12/28/2021../lm,b

## 2022-02-25 ENCOUNTER — Other Ambulatory Visit: Payer: Self-pay | Admitting: Internal Medicine

## 2022-02-25 DIAGNOSIS — F988 Other specified behavioral and emotional disorders with onset usually occurring in childhood and adolescence: Secondary | ICD-10-CM

## 2022-02-26 MED ORDER — AMPHETAMINE-DEXTROAMPHETAMINE 15 MG PO TABS: 15.0000 mg | ORAL_TABLET | Freq: Three times a day (TID) | ORAL | 0 refills | Status: DC

## 2022-03-18 ENCOUNTER — Encounter: Payer: Self-pay | Admitting: Internal Medicine

## 2022-03-19 ENCOUNTER — Other Ambulatory Visit: Payer: Self-pay

## 2022-03-19 MED ORDER — ESTRADIOL 1 MG PO TABS: ORAL_TABLET | ORAL | 3 refills | Status: DC

## 2022-03-19 MED ORDER — TRIAMTERENE-HCTZ 37.5-25 MG PO TABS: 1.0000 | ORAL_TABLET | Freq: Every day | ORAL | 3 refills | Status: DC

## 2022-03-26 ENCOUNTER — Other Ambulatory Visit: Payer: Self-pay | Admitting: Internal Medicine

## 2022-03-26 DIAGNOSIS — F988 Other specified behavioral and emotional disorders with onset usually occurring in childhood and adolescence: Secondary | ICD-10-CM

## 2022-03-26 NOTE — Telephone Encounter (Signed)
Check [place] registry last filled 02/26/2022. MD is out of the office until Monday pls advise.Marland KitchenRaechel Chute

## 2022-03-28 ENCOUNTER — Other Ambulatory Visit: Payer: Self-pay | Admitting: Internal Medicine

## 2022-03-28 DIAGNOSIS — F988 Other specified behavioral and emotional disorders with onset usually occurring in childhood and adolescence: Secondary | ICD-10-CM

## 2022-03-31 ENCOUNTER — Encounter: Payer: Self-pay | Admitting: Internal Medicine

## 2022-03-31 MED ORDER — AMPHETAMINE-DEXTROAMPHETAMINE 15 MG PO TABS: 15.0000 mg | ORAL_TABLET | Freq: Three times a day (TID) | ORAL | 0 refills | Status: DC

## 2022-04-20 ENCOUNTER — Encounter: Payer: Self-pay | Admitting: Internal Medicine

## 2022-04-20 NOTE — Progress Notes (Signed)
Subjective:    Patient ID: [name], female    DOB: 1955-09-23, 66 y.o.   MRN: [id number]      HPI [name] is here for a Physical exam.    She has no complaints.   Medications and allergies reviewed with patient and updated if appropriate.  Current Outpatient Medications on File Prior to Visit  Medication Sig Dispense Refill   amphetamine-dextroamphetamine (ADDERALL) 15 MG tablet Take 1 tablet by mouth 3 (three) times daily. 90 tablet 0   estradiol (ESTRACE) 1 MG tablet TAKE 1 TABLET BY MOUTH EVERY DAY 90 tablet 3   loratadine (CLARITIN) 10 MG tablet Take 10 mg by mouth daily as needed.     omeprazole (PRILOSEC) 20 MG capsule Take 20 mg by mouth daily.     polyethylene glycol powder (GLYCOLAX/MIRALAX) 17 GM/SCOOP powder Take 17 g by mouth 2 (two) times daily as needed. 3350 g 1   triamterene-hydrochlorothiazide (MAXZIDE-25) 37.5-25 MG tablet Take 1 tablet by mouth daily. 90 tablet 3   valACYclovir (VALTREX) 1000 MG tablet Take 2 tablets (2,000 mg total) by mouth 2 (two) times daily as needed. 60 tablet 0   verapamil (CALAN) 120 MG tablet TAKE 1 TABLET(120 MG) BY MOUTH THREE TIMES DAILY 270 tablet 3   No current facility-administered medications on file prior to visit.    Review of Systems  Constitutional:  Negative for fever.  Eyes:  Negative for visual disturbance.  Respiratory:  Negative for cough, shortness of breath and wheezing.   Cardiovascular:  Negative for chest pain, palpitations and leg swelling.  Gastrointestinal:  Negative for abdominal pain, blood in stool, constipation, diarrhea and nausea.       Gerd controlled  Genitourinary:  Negative for dysuria.  Musculoskeletal:  Positive for arthralgias (mild). Negative for back pain.  Skin:  Negative for rash.  Neurological:  Negative for light-headedness and headaches.  Psychiatric/Behavioral:  Negative for dysphoric mood. The patient is not nervous/anxious.        Objective:   Vitals:   04/24/22 0806   BP: 126/82  Pulse: 75  Temp: 98.1 F (36.7 C)  SpO2: 98%   Filed Weights   04/24/22 0806  Weight: 165 lb (74.8 kg)   Body mass index is 26.63 kg/m.  BP Readings from Last 3 Encounters:  04/24/22 126/82  03/13/21 136/84  03/07/20 (!) 140/88    Wt Readings from Last 3 Encounters:  04/24/22 165 lb (74.8 kg)  03/13/21 166 lb (75.3 kg)  03/07/20 160 lb (72.6 kg)       Physical Exam Constitutional: She appears well-developed and well-nourished. No distress.  HENT:  Head: Normocephalic and atraumatic.  Right Ear: External ear normal. Normal ear canal and TM Left Ear: External ear normal.  Normal ear canal and TM Mouth/Throat: Oropharynx is clear and moist.  Eyes: Conjunctivae normal.  Neck: Neck supple. No tracheal deviation present. No thyromegaly present.  No carotid bruit  Cardiovascular: Normal rate, regular rhythm and normal heart sounds.   No murmur heard.  No edema. Pulmonary/Chest: Effort normal and breath sounds normal. No respiratory distress. She has no wheezes. She has no rales.  Breast: deferred   Abdominal: Soft. She exhibits no distension. There is no tenderness.  Lymphadenopathy: She has no cervical adenopathy.  Skin: Skin is warm and dry. She is not diaphoretic.  Psychiatric: She has a normal mood and affect. Her behavior is normal.     Lab Results  Component Value Date   WBC 4.3  03/07/2020   HGB 14.0 03/07/2020   HCT 41.4 03/07/2020   PLT 243 03/07/2020   GLUCOSE 84 03/07/2020   CHOL 188 03/07/2020   TRIG 116 03/07/2020   HDL 80 03/07/2020   LDLDIRECT 111.7 12/03/2007   LDLCALC 87 03/07/2020   ALT 21 03/07/2020   AST 23 03/07/2020   NA 140 03/07/2020   K 4.2 03/07/2020   CL 102 03/07/2020   CREATININE 0.75 03/07/2020   BUN 9 03/07/2020   CO2 31 03/07/2020   TSH 1.56 12/14/2014   INR 1.0 12/06/2008         Assessment & Plan:   Physical exam: Screening blood work  ordered Exercise very active Weight good Substance abuse   none     Health Maintenance  Topic Date Due   Zoster Vaccines- Shingrix (1 of 2) Never done   MAMMOGRAM  08/10/2012   COLONOSCOPY (Pts 45-72yrs Insurance coverage will need to be confirmed)  02/28/2016   PAP SMEAR-Modifier  06/26/2017   COVID-19 Vaccine (2 - Pfizer series) 01/26/2020   INFLUENZA VACCINE  11/09/2022 (Originally 03/11/2022)   TETANUS/TDAP  05/26/2029   HPV VACCINES  Aged Out   Hepatitis C Screening  Discontinued   HIV Screening  Discontinued          See Problem List for Assessment and Plan of chronic medical problems.

## 2022-04-20 NOTE — Patient Instructions (Addendum)
Blood work was ordered.     Medications changes include :   none     Return in about 1 year (around 04/25/2023) for welcome to medicare, follow up.   Health Maintenance, Female Adopting a healthy lifestyle and getting preventive care are important in promoting health and wellness. Ask your health care provider about: The right schedule for you to have regular tests and exams. Things you can do on your own to prevent diseases and keep yourself healthy. What should I know about diet, weight, and exercise? Eat a healthy diet  Eat a diet that includes plenty of vegetables, fruits, low-fat dairy products, and lean protein. Do not eat a lot of foods that are high in solid fats, added sugars, or sodium. Maintain a healthy weight Body mass index (BMI) is used to identify weight problems. It estimates body fat based on height and weight. Your health care provider can help determine your BMI and help you achieve or maintain a healthy weight. Get regular exercise Get regular exercise. This is one of the most important things you can do for your health. Most adults should: Exercise for at least 150 minutes each week. The exercise should increase your heart rate and make you sweat (moderate-intensity exercise). Do strengthening exercises at least twice a week. This is in addition to the moderate-intensity exercise. Spend less time sitting. Even light physical activity can be beneficial. Watch cholesterol and blood lipids Have your blood tested for lipids and cholesterol at 64 years of age, then have this test every 5 years. Have your cholesterol levels checked more often if: Your lipid or cholesterol levels are high. You are older than 64 years of age. You are at high risk for heart disease. What should I know about cancer screening? Depending on your health history and family history, you may need to have cancer screening at various ages. This may include screening for: Breast  cancer. Cervical cancer. Colorectal cancer. Skin cancer. Lung cancer. What should I know about heart disease, diabetes, and high blood pressure? Blood pressure and heart disease High blood pressure causes heart disease and increases the risk of stroke. This is more likely to develop in people who have high blood pressure readings or are overweight. Have your blood pressure checked: Every 3-5 years if you are 50-45 years of age. Every year if you are 58 years old or older. Diabetes Have regular diabetes screenings. This checks your fasting blood sugar level. Have the screening done: Once every three years after age 74 if you are at a normal weight and have a low risk for diabetes. More often and at a younger age if you are overweight or have a high risk for diabetes. What should I know about preventing infection? Hepatitis B If you have a higher risk for hepatitis B, you should be screened for this virus. Talk with your health care provider to find out if you are at risk for hepatitis B infection. Hepatitis C Testing is recommended for: Everyone born from [birth year] through 1963. Anyone with known risk factors for hepatitis C. Sexually transmitted infections (STIs) Get screened for STIs, including gonorrhea and chlamydia, if: You are sexually active and are younger than 65 years of age. You are older than 64 years of age and your health care provider tells you that you are at risk for this type of infection. Your sexual activity has changed since you were last screened, and you are at increased risk for chlamydia or gonorrhea.  Ask your health care provider if you are at risk. Ask your health care provider about whether you are at high risk for HIV. Your health care provider may recommend a prescription medicine to help prevent HIV infection. If you choose to take medicine to prevent HIV, you should first get tested for HIV. You should then be tested every 3 months for as long as you are taking  the medicine. Pregnancy If you are about to stop having your period (premenopausal) and you may become pregnant, seek counseling before you get pregnant. Take 400 to 800 micrograms (mcg) of folic acid every day if you become pregnant. Ask for birth control (contraception) if you want to prevent pregnancy. Osteoporosis and menopause Osteoporosis is a disease in which the bones lose minerals and strength with aging. This can result in bone fractures. If you are 27 years old or older, or if you are at risk for osteoporosis and fractures, ask your health care provider if you should: Be screened for bone loss. Take a calcium or vitamin D supplement to lower your risk of fractures. Be given hormone replacement therapy (HRT) to treat symptoms of menopause. Follow these instructions at home: Alcohol use Do not drink alcohol if: Your health care provider tells you not to drink. You are pregnant, may be pregnant, or are planning to become pregnant. If you drink alcohol: Limit how much you have to: 0-1 drink a day. Know how much alcohol is in your drink. In the [place] one drink equals one 12 oz bottle of beer (355 mL), one 5 oz glass of wine (148 mL), or one 1 oz glass of hard liquor (44 mL). Lifestyle Do not use any products that contain nicotine or tobacco. These products include cigarettes, chewing tobacco, and vaping devices, such as e-cigarettes. If you need help quitting, ask your health care provider. Do not use street drugs. Do not share needles. Ask your health care provider for help if you need support or information about quitting drugs. General instructions Schedule regular health, dental, and eye exams. Stay current with your vaccines. Tell your health care provider if: You often feel depressed. You have ever been abused or do not feel safe at home. Summary Adopting a healthy lifestyle and getting preventive care are important in promoting health and wellness. Follow your health  care provider's instructions about healthy diet, exercising, and getting tested or screened for diseases. Follow your health care provider's instructions on monitoring your cholesterol and blood pressure. This information is not intended to replace advice given to you by your health care provider. Make sure you discuss any questions you have with your health care provider. Document Revised: 12/17/2020 Document Reviewed: 12/17/2020 Elsevier Patient Education  [name].

## 2022-04-24 ENCOUNTER — Ambulatory Visit (INDEPENDENT_AMBULATORY_CARE_PROVIDER_SITE_OTHER): Payer: Self-pay | Admitting: Internal Medicine

## 2022-04-24 VITALS — BP 126/82 | HR 75 | Temp 98.1°F | Ht 66.0 in | Wt 165.0 lb

## 2022-04-24 DIAGNOSIS — F988 Other specified behavioral and emotional disorders with onset usually occurring in childhood and adolescence: Secondary | ICD-10-CM

## 2022-04-24 DIAGNOSIS — I10 Essential (primary) hypertension: Secondary | ICD-10-CM

## 2022-04-24 DIAGNOSIS — Z0000 Encounter for general adult medical examination without abnormal findings: Secondary | ICD-10-CM

## 2022-04-24 DIAGNOSIS — Z79890 Hormone replacement therapy (postmenopausal): Secondary | ICD-10-CM

## 2022-04-24 NOTE — Assessment & Plan Note (Signed)
Chronic Doing well on replacement and would like to continue Continue estradiol 1 mg daily-discussed considering tapering off in the next several years

## 2022-04-24 NOTE — Assessment & Plan Note (Signed)
Chronic Blood pressure well controlled BMP Continue Maxide-25-1 tab daily, verapamil 120 mg 3 times daily

## 2022-04-24 NOTE — Assessment & Plan Note (Signed)
Chronic Controlled, Stable Continue Adderall 15 mg 3 times daily 

## 2022-05-05 ENCOUNTER — Other Ambulatory Visit: Payer: Self-pay | Admitting: Internal Medicine

## 2022-05-05 DIAGNOSIS — F988 Other specified behavioral and emotional disorders with onset usually occurring in childhood and adolescence: Secondary | ICD-10-CM

## 2022-05-06 MED ORDER — AMPHETAMINE-DEXTROAMPHETAMINE 15 MG PO TABS: 15.0000 mg | ORAL_TABLET | Freq: Three times a day (TID) | ORAL | 0 refills | Status: DC

## 2022-06-04 ENCOUNTER — Telehealth: Payer: Self-pay | Admitting: Internal Medicine

## 2022-06-04 DIAGNOSIS — F988 Other specified behavioral and emotional disorders with onset usually occurring in childhood and adolescence: Secondary | ICD-10-CM

## 2022-06-04 MED ORDER — AMPHETAMINE-DEXTROAMPHETAMINE 15 MG PO TABS: 15.0000 mg | ORAL_TABLET | Freq: Three times a day (TID) | ORAL | 0 refills | Status: DC

## 2022-06-04 NOTE — Telephone Encounter (Signed)
Patient Request REFILL:  ADDERALL 15 MG  Pharmacy :  [facility] filled: 05/06/22 Patient phone number: [phone] Last seen:  04/24/22 COMMENTS:  preferred pharmacy does not have medication in stock  **Let patient know to contact pharmacy at the end of the day to make sure medication is ready. **   ** Please notify patient to allow 48-72 hours to process**   **Encourage patient to contact the pharmacy for refills or they can request refills through [facility]**

## 2022-07-03 ENCOUNTER — Other Ambulatory Visit: Payer: Self-pay | Admitting: Internal Medicine

## 2022-07-03 DIAGNOSIS — F988 Other specified behavioral and emotional disorders with onset usually occurring in childhood and adolescence: Secondary | ICD-10-CM

## 2022-07-07 ENCOUNTER — Telehealth: Payer: Self-pay | Admitting: Internal Medicine

## 2022-07-07 MED ORDER — AMPHETAMINE-DEXTROAMPHETAMINE 15 MG PO TABS: 15.0000 mg | ORAL_TABLET | Freq: Three times a day (TID) | ORAL | 0 refills | Status: DC

## 2022-07-07 NOTE — Telephone Encounter (Signed)
Please send in ADDERALL while [facility] has it in stock. Call pt if any problem [phone]  Last visit: 04/24/22  Next visit: Not Scheduled  [facility]

## 2022-07-31 ENCOUNTER — Other Ambulatory Visit: Payer: Self-pay | Admitting: Internal Medicine

## 2022-07-31 DIAGNOSIS — F988 Other specified behavioral and emotional disorders with onset usually occurring in childhood and adolescence: Secondary | ICD-10-CM

## 2022-08-05 ENCOUNTER — Other Ambulatory Visit: Payer: Self-pay | Admitting: Internal Medicine

## 2022-08-05 DIAGNOSIS — F988 Other specified behavioral and emotional disorders with onset usually occurring in childhood and adolescence: Secondary | ICD-10-CM

## 2022-08-05 MED ORDER — AMPHETAMINE-DEXTROAMPHETAMINE 15 MG PO TABS: 15.0000 mg | ORAL_TABLET | Freq: Three times a day (TID) | ORAL | 0 refills | Status: DC

## 2022-08-24 ENCOUNTER — Telehealth: Payer: Self-pay | Admitting: Family

## 2022-08-24 DIAGNOSIS — L255 Unspecified contact dermatitis due to plants, except food: Secondary | ICD-10-CM

## 2022-08-24 MED ORDER — TRIAMCINOLONE ACETONIDE 0.5 % EX OINT: 1.0000 | TOPICAL_OINTMENT | Freq: Two times a day (BID) | CUTANEOUS | 0 refills | Status: DC

## 2022-08-24 MED ORDER — PREDNISONE 10 MG (21) PO TBPK: ORAL_TABLET | ORAL | 0 refills | Status: DC

## 2022-08-24 NOTE — Progress Notes (Signed)
E-Visit for [employer]  We are sorry that you are not feeing well.  Here is how we plan to help!  Based on what you have shared with me it looks like you have had an allergic reaction to the oily resin from a group of plants.  This resin is very sticky, so it easily attaches to your skin, clothing, tools equipment, and pet's fur.    This blistering rash is often called poison ivy rash although it can come from contact with the leaves, stems and roots of poison ivy, poison oak and poison sumac.  The oily resin contains urushiol (u-ROO-she-ol) that produces a skin rash on exposed skin.  The severity of the rash depends on the amount of urushiol that gets on your skin.  A section of skin with more urushiol on it may develop a rash sooner.  The rash usually develops 12-48 hours after exposure and can last two to three weeks.  Your skin must come in direct contact with the plant's oil to be affected.  Blister fluid doesn't spread the rash.  However, if you come into contact with a piece of clothing or pet fur that has urushiol on it, the rash may spread out.  You can also transfer the oil to other parts of your body with your fingers.  Often the rash looks like a straight line because of the way the plant brushes against your skin.  Since your rash is widespread or has resulted in a large number of blisters, I have prescribed an oral corticosteroid.  Please follow these recommendations:  I have sent a prednisone dose pack to your chosen pharmacy. Be sure to follow the instructions carefully and complete the entire prescription. You may use Benadryl or Caladryl topical lotions to sooth the itch and remember cool, not hot, showers and baths can help relieve the itching!  Place cool, wet compresses on the affected area for 15-30 minutes several times a day.  You may also take oral antihistamines, such as diphenhydramine (Benadryl, others), which may also help you sleep better.  Watch your skin for any purulent  (pus) drainage or red streaking from the site.  If this occurs, contact your provider.  You may require an antibiotic for a skin infection.  Make sure that the clothes you were wearing as well as any towels or sheets that may have come in contact with the oil (urushiol) are washed in detergent and hot water.       I have developed the following plan to treat your condition I am prescribing you steroids and kenalog cream that you apply twice a day as needed.   What can you do to prevent this rash?  Avoid the plants.  Learn how to identify poison ivy, poison oak and poison sumac in all seasons.  When hiking or engaging in other activities that might expose you to these plants, try to stay on cleared pathways.  If camping, make sure you pitch your tent in an area free of these plants.  Keep pets from running through wooded areas so that urushiol doesn't accidentally stick to their fur, which you may touch.  Remove or kill the plants.  In your yard, you can get rid of poison ivy by applying an herbicide or pulling it out of the ground, including the roots, while wearing heavy gloves.  Afterward remove the gloves and thoroughly wash them and your hands.  Don't burn poison ivy or related plants because the urushiol can be  carried by smoke.  Wear protective clothing.  If needed, protect your skin by wearing socks, boots, pants, long sleeves and vinyl gloves.  Wash your skin right away.  Washing off the oil with soap and water within 30 minutes of exposure may reduce your chances of getting a poison ivy rash.  Even washing after an hour or so can help reduce the severity of the rash.  If you walk through some poison ivy and then later touch your shoes, you may get some urushiol on your hands, which may then transfer to your face or body by touching or rubbing.  If the contaminated object isn't cleaned, the urushiol on it can still cause a skin reaction years later.    Be careful not to reuse towels after  you have washed your skin.  Also carefully wash clothing in detergent and hot water to remove all traces of the oil.  Handle contaminated clothing carefully so you don't transfer the urushiol to yourself, furniture, rugs or appliances.  Remember that pets can carry the oil on their fur and paws.  If you think your pet may be contaminated with urushiol, put on some long rubber gloves and give your pet a bath.  Finally, be careful not to burn these plants as the smoke can contain traces of the oil.  Inhaling the smoke may result in difficulty breathing. If that occurred you should see a physician as soon as possible.  See your doctor right away if:  The reaction is severe or widespread You inhaled the smoke from burning poison ivy and are having difficulty breathing Your skin continues to swell The rash affects your eyes, mouth or genitals Blisters are oozing pus You develop a fever greater than 100 F (37.8 C) The rash doesn't get better within a few weeks.  If you scratch the poison ivy rash, bacteria under your fingernails may cause the skin to become infected.  See your doctor if pus starts oozing from the blisters.  Treatment generally includes antibiotics.  Poison ivy treatments are usually limited to self-care methods.  And the rash typically goes away on its own in two to three weeks.     If the rash is widespread or results in a large number of blisters, your doctor may prescribe an oral corticosteroid, such as prednisone.  If a bacterial infection has developed at the rash site, your doctor may give you a prescription for an oral antibiotic.  MAKE SURE YOU  Understand these instructions. Will watch your condition. Will get help right away if you are not doing well or get worse.   Thank you for choosing an e-visit.  Your e-visit answers were reviewed by a board certified advanced clinical practitioner to complete your personal care plan. Depending upon the condition, your plan  could have included both over the counter or prescription medications.  Please review your pharmacy choice. Make sure the pharmacy is open so you can pick up prescription now. If there is a problem, you may contact your provider through [employer] and have the prescription routed to another pharmacy.  Your safety is important to [place]. If you have drug allergies check your prescription carefully.   For the next 24 hours you can use MyChart to ask questions about today's visit, request a non-urgent call back, or ask for a work or school excuse. You will get an email in the next two days asking about your experience. I hope that your e-visit has been valuable and will  speed your recovery.   Approximately 5 minutes was spent documenting and reviewing patient's chart.

## 2022-09-03 ENCOUNTER — Other Ambulatory Visit: Payer: Self-pay | Admitting: Internal Medicine

## 2022-09-03 DIAGNOSIS — F988 Other specified behavioral and emotional disorders with onset usually occurring in childhood and adolescence: Secondary | ICD-10-CM

## 2022-09-04 ENCOUNTER — Other Ambulatory Visit: Payer: Self-pay | Admitting: Internal Medicine

## 2022-09-04 DIAGNOSIS — F988 Other specified behavioral and emotional disorders with onset usually occurring in childhood and adolescence: Secondary | ICD-10-CM

## 2022-09-04 MED ORDER — AMPHETAMINE-DEXTROAMPHETAMINE 15 MG PO TABS: 15.0000 mg | ORAL_TABLET | Freq: Three times a day (TID) | ORAL | 0 refills | Status: DC

## 2022-09-04 NOTE — Telephone Encounter (Signed)
Patient called and was trying to refill her medication. She saw it was denied and wanted to see why.

## 2022-10-05 ENCOUNTER — Other Ambulatory Visit: Payer: Self-pay | Admitting: Internal Medicine

## 2022-10-05 DIAGNOSIS — F988 Other specified behavioral and emotional disorders with onset usually occurring in childhood and adolescence: Secondary | ICD-10-CM

## 2022-10-06 MED ORDER — AMPHETAMINE-DEXTROAMPHETAMINE 15 MG PO TABS: 15.0000 mg | ORAL_TABLET | Freq: Three times a day (TID) | ORAL | 0 refills | Status: DC

## 2022-10-16 ENCOUNTER — Telehealth: Payer: Self-pay | Admitting: Emergency Medicine

## 2022-10-16 DIAGNOSIS — J019 Acute sinusitis, unspecified: Secondary | ICD-10-CM

## 2022-10-16 MED ORDER — BENZONATATE 100 MG PO CAPS: 100.0000 mg | ORAL_CAPSULE | Freq: Two times a day (BID) | ORAL | 0 refills | Status: DC | PRN

## 2022-10-16 NOTE — Progress Notes (Signed)
Virtual Visit Consent   [name], you are scheduled for a virtual visit with a [facility] provider today. Just as with appointments in the office, your consent must be obtained to participate. Your consent will be active for this visit and any virtual visit you may have with one of our providers in the next 365 days. If you have a MyChart account, a copy of this consent can be sent to you electronically.  As this is a virtual visit, video technology does not allow for your provider to perform a traditional examination. This may limit your provider's ability to fully assess your condition. If your provider identifies any concerns that need to be evaluated in person or the need to arrange testing (such as labs, EKG, etc.), we will make arrangements to do so. Although advances in technology are sophisticated, we cannot ensure that it will always work on either your end or our end. If the connection with a video visit is poor, the visit may have to be switched to a telephone visit. With either a video or telephone visit, we are not always able to ensure that we have a secure connection.  By engaging in this virtual visit, you consent to the provision of healthcare and authorize for your insurance to be billed (if applicable) for the services provided during this visit. Depending on your insurance coverage, you may receive a charge related to this service.  I need to obtain your verbal consent now. Are you willing to proceed with your visit today? HAILEY BIGNER has provided verbal consent on 10/16/2022 for a virtual visit (video or telephone). Montine Circle, PA-C  Date: 10/16/2022 2:05 PM  Virtual Visit via Video Note   I, Montine Circle, connected with  [name]  ([id number], [id number]) on 10/16/22 at  2:00 PM EST by a video-enabled telemedicine application and verified that I am speaking with the correct person using two identifiers.  Location: Patient: Virtual Visit Location Patient:  Home Provider: Virtual Visit Location Provider: Home Office   I discussed the limitations of evaluation and management by telemedicine and the availability of in person appointments. The patient expressed understanding and agreed to proceed.    History of Present Illness: [name] is a 67 y.o. who identifies as a female who was assigned female at birth, and is being seen today for URI symptoms.  States that she has had congestion, cough, and losing voice for the past 3-4 days.  Denies any fever.  Reports sinus pressure and congestion and watery eyes.  Has tried Robitussin and Benadryl.    HPI: HPI  Problems:  Patient Active Problem List   Diagnosis Date Noted   Bilateral upper abdominal discomfort 03/13/2021   Hormone replacement therapy (HRT) 02/09/2018   Incidental lung nodule RUL stable 2012-2016 - no f/u needed 01/08/2012   Eczema 01/08/2012   Attention deficit disorder 04/14/2007   Essential hypertension 12/09/2006   ALLERGIC RHINITIS 12/09/2006   IBS 12/09/2006    Allergies:  Allergies  Allergen Reactions   Nifedipine     REACTION: increased HR   Shellfish Allergy    Medications:  Current Outpatient Medications:    benzonatate (TESSALON) 100 MG capsule, Take 1 capsule (100 mg total) by mouth 2 (two) times daily as needed for cough., Disp: 20 capsule, Rfl: 0   amphetamine-dextroamphetamine (ADDERALL) 15 MG tablet, Take 1 tablet by mouth 3 (three) times daily., Disp: 90 tablet, Rfl: 0   estradiol (ESTRACE) 1 MG tablet, TAKE  1 TABLET BY MOUTH EVERY DAY, Disp: 90 tablet, Rfl: 3   loratadine (CLARITIN) 10 MG tablet, Take 10 mg by mouth daily as needed., Disp: , Rfl:    omeprazole (PRILOSEC) 20 MG capsule, Take 20 mg by mouth daily., Disp: , Rfl:    polyethylene glycol powder (GLYCOLAX/MIRALAX) 17 GM/SCOOP powder, Take 17 g by mouth 2 (two) times daily as needed., Disp: 3350 g, Rfl: 1   predniSONE (STERAPRED UNI-PAK 21 TAB) 10 MG (21) TBPK tablet, Use as directed, Disp: 21  tablet, Rfl: 0   triamcinolone ointment (KENALOG) 0.5 %, Apply 1 Application topically 2 (two) times daily., Disp: 30 g, Rfl: 0   triamterene-hydrochlorothiazide (MAXZIDE-25) 37.5-25 MG tablet, Take 1 tablet by mouth daily., Disp: 90 tablet, Rfl: 3   valACYclovir (VALTREX) 1000 MG tablet, Take 2 tablets (2,000 mg total) by mouth 2 (two) times daily as needed., Disp: 60 tablet, Rfl: 0   verapamil (CALAN) 120 MG tablet, TAKE 1 TABLET(120 MG) BY MOUTH THREE TIMES DAILY, Disp: 270 tablet, Rfl: 3  Observations/Objective: Patient is well-developed, well-nourished in no acute distress.  Resting comfortably at home.  Head is normocephalic, atraumatic.  No labored breathing.  Speech is clear and coherent with logical content.  Patient is alert and oriented at baseline.    Assessment and Plan: 1. Acute sinusitis, recurrence not specified, unspecified location  Continue with supportive care at home.  Discussed Nyquil and Dayquil.  Tessalon perles for cough.  Return precautions discussed.  Meds ordered this encounter  Medications   benzonatate (TESSALON) 100 MG capsule    Sig: Take 1 capsule (100 mg total) by mouth 2 (two) times daily as needed for cough.    Dispense:  20 capsule    Refill:  0    Order Specific Question:   Supervising Provider    Answer:   Chase Picket [id number]     Follow Up Instructions: I discussed the assessment and treatment plan with the patient. The patient was provided an opportunity to ask questions and all were answered. The patient agreed with the plan and demonstrated an understanding of the instructions.  A copy of instructions were sent to the patient via MyChart unless otherwise noted below.     The patient was advised to call back or seek an in-person evaluation if the symptoms worsen or if the condition fails to improve as anticipated.  Time:  I spent 10 minutes with the patient via telehealth technology discussing the above problems/concerns.     Montine Circle, PA-C

## 2022-10-16 NOTE — Patient Instructions (Signed)
[name], thank you for joining Montine Circle, PA-C for today's virtual visit.  While this provider is not your primary care provider (PCP), if your PCP is located in our provider database this encounter information will be shared with them immediately following your visit.   [facility] account gives you access to today's visit and all your visits, tests, and labs performed at [facility] " click here if you don't have a [facility] account or go to [email or web address]  Consent: (Patient) [name] provided verbal consent for this virtual visit at the beginning of the encounter.  Current Medications:  Current Outpatient Medications:    benzonatate (TESSALON) 100 MG capsule, Take 1 capsule (100 mg total) by mouth 2 (two) times daily as needed for cough., Disp: 20 capsule, Rfl: 0   amphetamine-dextroamphetamine (ADDERALL) 15 MG tablet, Take 1 tablet by mouth 3 (three) times daily., Disp: 90 tablet, Rfl: 0   estradiol (ESTRACE) 1 MG tablet, TAKE 1 TABLET BY MOUTH EVERY DAY, Disp: 90 tablet, Rfl: 3   loratadine (CLARITIN) 10 MG tablet, Take 10 mg by mouth daily as needed., Disp: , Rfl:    omeprazole (PRILOSEC) 20 MG capsule, Take 20 mg by mouth daily., Disp: , Rfl:    polyethylene glycol powder (GLYCOLAX/MIRALAX) 17 GM/SCOOP powder, Take 17 g by mouth 2 (two) times daily as needed., Disp: 3350 g, Rfl: 1   predniSONE (STERAPRED UNI-PAK 21 TAB) 10 MG (21) TBPK tablet, Use as directed, Disp: 21 tablet, Rfl: 0   triamcinolone ointment (KENALOG) 0.5 %, Apply 1 Application topically 2 (two) times daily., Disp: 30 g, Rfl: 0   triamterene-hydrochlorothiazide (MAXZIDE-25) 37.5-25 MG tablet, Take 1 tablet by mouth daily., Disp: 90 tablet, Rfl: 3   valACYclovir (VALTREX) 1000 MG tablet, Take 2 tablets (2,000 mg total) by mouth 2 (two) times daily as needed., Disp: 60 tablet, Rfl: 0   verapamil (CALAN) 120 MG tablet, TAKE 1 TABLET(120 MG) BY MOUTH THREE TIMES  DAILY, Disp: 270 tablet, Rfl: 3   Medications ordered in this encounter:  Meds ordered this encounter  Medications   benzonatate (TESSALON) 100 MG capsule    Sig: Take 1 capsule (100 mg total) by mouth 2 (two) times daily as needed for cough.    Dispense:  20 capsule    Refill:  0    Order Specific Question:   Supervising Provider    Answer:   Chase Picket [id number]     *If you need refills on other medications prior to your next appointment, please contact your pharmacy*  Follow-Up: Call back or seek an in-person evaluation if the symptoms worsen or if the condition fails to improve as anticipated.  [facility] [phone]  Other Instructions    If you have been instructed to have an in-person evaluation today at a local Urgent Care facility, please use the link below. It will take you to a list of all of our available [facility], including address, phone number and hours of operation. Please do not delay care.  [facility]  If you or a family member do not have a primary care provider, use the link below to schedule a visit and establish care. When you choose a [facility] primary care physician or advanced practice provider, you gain a long-term partner in health. Find a Primary Care Provider  Learn more about [facility] in-office and virtual care options: [facility]

## 2022-11-02 ENCOUNTER — Other Ambulatory Visit: Payer: Self-pay | Admitting: Internal Medicine

## 2022-11-02 DIAGNOSIS — F988 Other specified behavioral and emotional disorders with onset usually occurring in childhood and adolescence: Secondary | ICD-10-CM

## 2022-11-03 MED ORDER — AMPHETAMINE-DEXTROAMPHETAMINE 15 MG PO TABS: 15.0000 mg | ORAL_TABLET | Freq: Three times a day (TID) | ORAL | 0 refills | Status: DC

## 2022-12-03 ENCOUNTER — Other Ambulatory Visit: Payer: Self-pay | Admitting: Internal Medicine

## 2022-12-03 DIAGNOSIS — F988 Other specified behavioral and emotional disorders with onset usually occurring in childhood and adolescence: Secondary | ICD-10-CM

## 2022-12-03 MED ORDER — AMPHETAMINE-DEXTROAMPHETAMINE 15 MG PO TABS: 15.0000 mg | ORAL_TABLET | Freq: Three times a day (TID) | ORAL | 0 refills | Status: DC

## 2022-12-30 ENCOUNTER — Other Ambulatory Visit: Payer: Self-pay | Admitting: Internal Medicine

## 2022-12-30 DIAGNOSIS — F988 Other specified behavioral and emotional disorders with onset usually occurring in childhood and adolescence: Secondary | ICD-10-CM

## 2022-12-31 MED ORDER — AMPHETAMINE-DEXTROAMPHETAMINE 15 MG PO TABS: 15.0000 mg | ORAL_TABLET | Freq: Three times a day (TID) | ORAL | 0 refills | Status: DC

## 2023-01-29 ENCOUNTER — Other Ambulatory Visit: Payer: Self-pay | Admitting: Internal Medicine

## 2023-01-29 DIAGNOSIS — F988 Other specified behavioral and emotional disorders with onset usually occurring in childhood and adolescence: Secondary | ICD-10-CM

## 2023-01-30 MED ORDER — AMPHETAMINE-DEXTROAMPHETAMINE 15 MG PO TABS: 15.0000 mg | ORAL_TABLET | Freq: Three times a day (TID) | ORAL | 0 refills | Status: DC

## 2023-02-24 ENCOUNTER — Emergency Department (HOSPITAL_BASED_OUTPATIENT_CLINIC_OR_DEPARTMENT_OTHER): Payer: Self-pay

## 2023-02-24 ENCOUNTER — Observation Stay (HOSPITAL_BASED_OUTPATIENT_CLINIC_OR_DEPARTMENT_OTHER)
Admission: EM | Admit: 2023-02-24 | Discharge: 2023-02-25 | Disposition: A | Discharge status: Home / Self Care | Payer: Self-pay | Attending: Family Medicine | Admitting: Family Medicine

## 2023-02-24 ENCOUNTER — Other Ambulatory Visit: Payer: Self-pay

## 2023-02-24 ENCOUNTER — Encounter (HOSPITAL_BASED_OUTPATIENT_CLINIC_OR_DEPARTMENT_OTHER): Payer: Self-pay

## 2023-02-24 DIAGNOSIS — R9431 Abnormal electrocardiogram [ECG] [EKG]: Secondary | ICD-10-CM | POA: Diagnosis present

## 2023-02-24 DIAGNOSIS — Z87891 Personal history of nicotine dependence: Secondary | ICD-10-CM | POA: Insufficient documentation

## 2023-02-24 DIAGNOSIS — Z8616 Personal history of COVID-19: Secondary | ICD-10-CM | POA: Insufficient documentation

## 2023-02-24 DIAGNOSIS — I10 Essential (primary) hypertension: Secondary | ICD-10-CM | POA: Diagnosis present

## 2023-02-24 DIAGNOSIS — I4581 Long QT syndrome: Secondary | ICD-10-CM | POA: Insufficient documentation

## 2023-02-24 DIAGNOSIS — R29810 Facial weakness: Secondary | ICD-10-CM

## 2023-02-24 DIAGNOSIS — I639 Cerebral infarction, unspecified: Principal | ICD-10-CM | POA: Insufficient documentation

## 2023-02-24 DIAGNOSIS — Z8673 Personal history of transient ischemic attack (TIA), and cerebral infarction without residual deficits: Secondary | ICD-10-CM | POA: Diagnosis present

## 2023-02-24 DIAGNOSIS — E876 Hypokalemia: Secondary | ICD-10-CM | POA: Diagnosis present

## 2023-02-24 LAB — DIFFERENTIAL
Abs Immature Granulocytes: 0.06 10*3/uL (ref 0.00–0.07)
Basophils Absolute: 0 10*3/uL (ref 0.0–0.1)
Basophils Relative: 1 %
Eosinophils Absolute: 0.1 10*3/uL (ref 0.0–0.5)
Eosinophils Relative: 2 %
Immature Granulocytes: 1 %
Lymphocytes Relative: 28 %
Lymphs Abs: 1.4 10*3/uL (ref 0.7–4.0)
Monocytes Absolute: 0.5 10*3/uL (ref 0.1–1.0)
Monocytes Relative: 10 %
Neutro Abs: 3 10*3/uL (ref 1.7–7.7)
Neutrophils Relative %: 58 %

## 2023-02-24 LAB — CBC
HCT: 39.2 % (ref 36.0–46.0)
Hemoglobin: 13.7 g/dL (ref 12.0–15.0)
MCH: 31.9 pg (ref 26.0–34.0)
MCHC: 34.9 g/dL (ref 30.0–36.0)
MCV: 91.2 fL (ref 80.0–100.0)
Platelets: 293 10*3/uL (ref 150–400)
RBC: 4.3 MIL/uL (ref 3.87–5.11)
RDW: 13.7 % (ref 11.5–15.5)
WBC: 5 10*3/uL (ref 4.0–10.5)
nRBC: 0 % (ref 0.0–0.2)

## 2023-02-24 LAB — ETHANOL: Alcohol, Ethyl (B): <10 mg/dL (ref <10)

## 2023-02-24 LAB — COMPREHENSIVE METABOLIC PANEL
ALT: 28 U/L (ref 0–44)
AST: 27 U/L (ref 15–41)
Albumin: 4.1 g/dL (ref 3.5–5.0)
Alkaline Phosphatase: 76 U/L (ref 38–126)
Anion gap: 11 (ref 5–15)
BUN: 17 mg/dL (ref 8–23)
CO2: 26 mmol/L (ref 22–32)
Calcium: 9.2 mg/dL (ref 8.9–10.3)
Chloride: 100 mmol/L (ref 98–111)
Creatinine, Ser: 0.83 mg/dL (ref 0.44–1.00)
GFR, Estimated: >60 mL/min (ref >60)
Glucose, Bld: 107 mg/dL — ABNORMAL HIGH (ref 70–99)
Potassium: 3 mmol/L — ABNORMAL LOW (ref 3.5–5.1)
Sodium: 137 mmol/L (ref 135–145)
Total Bilirubin: 0.3 mg/dL (ref 0.3–1.2)
Total Protein: 7.4 g/dL (ref 6.5–8.1)

## 2023-02-24 LAB — CBG MONITORING, ED: Glucose-Capillary: 105 mg/dL — ABNORMAL HIGH (ref 70–99)

## 2023-02-24 LAB — PROTIME-INR
INR: 0.9 (ref 0.8–1.2)
Prothrombin Time: 12.2 seconds (ref 11.4–15.2)

## 2023-02-24 LAB — APTT: aPTT: 23 seconds — ABNORMAL LOW (ref 24–36)

## 2023-02-24 MED ORDER — POTASSIUM CHLORIDE 10 MEQ/100ML IV SOLN
10.0000 meq | INTRAVENOUS | Status: AC
  Administered 2023-02-24 (×3): 10 meq via INTRAVENOUS
  Filled 2023-02-24 (×3): qty 100

## 2023-02-24 NOTE — ED Provider Notes (Signed)
[facility] Provider Note   CSN: [id number] Arrival date & time: 02/24/23  1659     History  Chief Complaint  Patient presents with   Facial Droop    [name] is a 67 y.o. female.  67 year old female with past medical history significant for hypertension presents today for concern of facial droop, speech difficulty that started at 10:30 AM.  She states when she woke up this morning she was at baseline.  She states she started noticing difficulty with speech while she was at work.  She mentioned this to her coworker told her was about 1030 when she relates this to.  She states she did not have any extremity weakness, or numbness.  No prior history of CVA.  She states 2 months ago she stopped taking the verapamil because the pharmacy was out of the medication to her prescription.  She has been taking the triamterene for her hypertension.  She states over the past week she also had COVID infection which she cleared on Sunday.  She denies chest pain, shortness of breath.       Home Medications Prior to Admission medications   Medication Sig Start Date End Date Taking? Authorizing Provider  amphetamine-dextroamphetamine (ADDERALL) 15 MG tablet Take 1 tablet by mouth 3 (three) times daily. 01/30/23   Pincus Sanes, MD  benzonatate (TESSALON) 100 MG capsule Take 1 capsule (100 mg total) by mouth 2 (two) times daily as needed for cough. 10/16/22   Roxy Horseman, PA-C  estradiol (ESTRACE) 1 MG tablet TAKE 1 TABLET BY MOUTH EVERY DAY 03/19/22   Pincus Sanes, MD  loratadine (CLARITIN) 10 MG tablet Take 10 mg by mouth daily as needed.    [provider]  omeprazole (PRILOSEC) 20 MG capsule Take 20 mg by mouth daily.    [provider]  polyethylene glycol powder (GLYCOLAX/MIRALAX) 17 GM/SCOOP powder Take 17 g by mouth 2 (two) times daily as needed. 03/13/21   Pincus Sanes, MD  predniSONE (STERAPRED UNI-PAK 21 TAB) 10 MG (21)  TBPK tablet Use as directed 08/24/22   Jannifer Rodney A, FNP  triamcinolone ointment (KENALOG) 0.5 % Apply 1 Application topically 2 (two) times daily. 08/24/22   Junie Spencer, FNP  triamterene-hydrochlorothiazide (MAXZIDE-25) 37.5-25 MG tablet Take 1 tablet by mouth daily. 03/19/22   Pincus Sanes, MD  valACYclovir (VALTREX) 1000 MG tablet Take 2 tablets (2,000 mg total) by mouth 2 (two) times daily as needed. 03/07/20   Pincus Sanes, MD  verapamil (CALAN) 120 MG tablet TAKE 1 TABLET(120 MG) BY MOUTH THREE TIMES DAILY 10/21/21   Pincus Sanes, MD      Allergies    Nifedipine and Shellfish allergy    Review of Systems   Review of Systems  Constitutional:  Negative for chills and fever.  Eyes:  Negative for visual disturbance.  Respiratory:  Negative for shortness of breath.   Cardiovascular:  Negative for chest pain.  Neurological:  Negative for weakness, light-headedness and numbness.  All other systems reviewed and are negative.   Physical Exam Updated Vital Signs BP (!) 188/100   Pulse 81   Temp 97.8 F (36.6 C) (Oral)   Resp 16   SpO2 99%  Physical Exam Vitals and nursing note reviewed.  Constitutional:      General: She is not in acute distress.    Appearance: Normal appearance. She is not ill-appearing.  HENT:     Head:  Normocephalic and atraumatic.     Nose: Nose normal.  Eyes:     General: No scleral icterus.    Extraocular Movements: Extraocular movements intact.     Conjunctiva/sclera: Conjunctivae normal.     Pupils: Pupils are equal, round, and reactive to light.  Cardiovascular:     Rate and Rhythm: Normal rate and regular rhythm.     Heart sounds: Normal heart sounds.  Pulmonary:     Effort: Pulmonary effort is normal. No respiratory distress.     Breath sounds: Normal breath sounds. No wheezing or rales.  Abdominal:     General: There is no distension.     Tenderness: There is no abdominal tenderness.  Musculoskeletal:        General: Normal range  of motion.     Cervical back: Normal range of motion.  Skin:    General: Skin is warm and dry.  Neurological:     General: No focal deficit present.     Mental Status: She is alert and oriented to person, place, and time. Mental status is at baseline.     Comments: Facial droop on the right side noted.  Some slurring of speech noted.  Full range of motion bilateral upper and lower extremities.  5/5 strength and extensor Muscle groups of upper and lower extremities.  Left bilateral drift.  EOMs normal.    ED Results / Procedures / Treatments   Labs (all labs ordered are listed, but only abnormal results are displayed) Labs Reviewed  ETHANOL  PROTIME-INR  APTT  CBC  DIFFERENTIAL  COMPREHENSIVE METABOLIC PANEL  RAPID URINE DRUG SCREEN, HOSP PERFORMED  URINALYSIS, ROUTINE W REFLEX MICROSCOPIC    EKG EKG Interpretation Date/Time:  Tuesday February 24 2023 17:15:53 EDT Ventricular Rate:  78 PR Interval:  165 QRS Duration:  96 QT Interval:  454 QTC Calculation: 518 R Axis:   4  Text Interpretation: Sinus rhythm Borderline T wave abnormalities Prolonged QT interval Confirmed by Linwood Dibbles [phone]) on 02/24/2023 5:20:56 PM  Radiology No results found.  Procedures Procedures    Medications Ordered in ED Medications - No data to display  ED Course/ Medical Decision Making/ A&P                             Medical Decision Making Amount and/or Complexity of Data Reviewed Labs: ordered. Radiology: ordered.  Risk Prescription drug management.   Medical Decision Making / ED Course   This patient presents to the ED for concern of facial droop, this involves an extensive number of treatment options, and is a complaint that carries with it a high risk of complications and morbidity.  The differential diagnosis includes CVA, Bell's palsy, TIA  MDM: 67 year old female with past medical history significant for hypertension presents today for concern of facial droop, speech  difficulty that started at 10:30 AM today.  She is hypertensive.  Recently stopped one of her antihypertensives 2 months ago.  Signs and symptoms localized to facial droop, slurring of her speech, and sensation deficit on the right side of her face.  Otherwise no focal neurological deficits. CT head without contrast does not show any acute findings.  EKG without acute findings.  CBC is unremarkable.  CMP shows potassium of 3.0 otherwise no acute findings.  No other acute findings on workup.  Discussed with neurologist who is in agreement to transfer patient for an MRI brain.  Neurologist if MRI is negative patient's dispo  will be at the discretion of the ED eval.  She states that this could be recrudescence due to hypertensive emergency/urgency.  Permissive hypertension until MRI is done, and treat the elevated blood pressure.   Lab Tests: -I ordered, reviewed, and interpreted labs.   The pertinent results include:   Labs Reviewed  APTT - Abnormal; Notable for the following components:      Result Value   aPTT 23 (*)    All other components within normal limits  COMPREHENSIVE METABOLIC PANEL - Abnormal; Notable for the following components:   Potassium 3.0 (*)    Glucose, Bld 107 (*)    All other components within normal limits  CBG MONITORING, ED - Abnormal; Notable for the following components:   Glucose-Capillary 105 (*)    All other components within normal limits  ETHANOL  PROTIME-INR  CBC  DIFFERENTIAL  RAPID URINE DRUG SCREEN, HOSP PERFORMED  URINALYSIS, ROUTINE W REFLEX MICROSCOPIC      EKG  EKG Interpretation Date/Time:  Tuesday February 24 2023 17:15:53 EDT Ventricular Rate:  78 PR Interval:  165 QRS Duration:  96 QT Interval:  454 QTC Calculation: 518 R Axis:   4  Text Interpretation: Sinus rhythm Borderline T wave abnormalities Prolonged QT interval Confirmed by Linwood Dibbles [phone]) on 02/24/2023 5:20:56 PM         Imaging Studies ordered: I ordered imaging studies  including CT head I independently visualized and interpreted imaging. I agree with the radiologist interpretation   Medicines ordered and prescription drug management: Meds ordered this encounter  Medications   potassium chloride 10 mEq in 100 mL IVPB    -I have reviewed the patients home medicines and have made adjustments as needed  Consultations Obtained: I requested consultation with the neurologist Dr. Iver Nestle,  and discussed lab and imaging findings as well as pertinent plan - they recommend: as above   Reevaluation: After the interventions noted above, I reevaluated the patient and found that they have :stayed the same  Co morbidities that complicate the patient evaluation  Past Medical History:  Diagnosis Date   ADD    ALLERGIC RHINITIS    GERD    HOMOCYSTINEMIA    HYPERTENSION    IBS    Incidental lung nodule 02/2011 ct   unchanged 07/16/11 CT, 6 mo f/u  planned      Dispostion: Patient will need to be transferred to [facility] as an ED to ED transfer for MRI brain without contrast.  Discussed with Dr. Rubin Payor who accepts patient as a transfer.    Final Clinical Impression(s) / ED Diagnoses Final diagnoses:  Facial droop    Rx / DC Orders ED Discharge Orders     None         Marita Kansas, PA-C 02/24/23 Geryl Councilman, MD 02/25/23 [phone]

## 2023-02-24 NOTE — ED Triage Notes (Signed)
Pt arrives with c/o slurred speech, right sided facial droop, and sensation deficit on right of her face. Per pt, LSN was at 10:30 this morning. Pt denies focal weakness. Pt was at work when symptoms started.

## 2023-02-24 NOTE — ED Notes (Signed)
Per provider, pt is okay to eat and drink. Will provide food and water at this time.

## 2023-02-24 NOTE — ED Notes (Signed)
Called [facility] for transport at 8:35 pm.

## 2023-02-25 ENCOUNTER — Observation Stay (HOSPITAL_BASED_OUTPATIENT_CLINIC_OR_DEPARTMENT_OTHER): Payer: Self-pay

## 2023-02-25 ENCOUNTER — Observation Stay (HOSPITAL_COMMUNITY): Payer: Self-pay

## 2023-02-25 ENCOUNTER — Other Ambulatory Visit (HOSPITAL_COMMUNITY): Payer: Self-pay

## 2023-02-25 ENCOUNTER — Encounter (HOSPITAL_COMMUNITY): Payer: Self-pay | Admitting: Family Medicine

## 2023-02-25 ENCOUNTER — Emergency Department (HOSPITAL_COMMUNITY): Payer: Self-pay

## 2023-02-25 DIAGNOSIS — I10 Essential (primary) hypertension: Secondary | ICD-10-CM

## 2023-02-25 DIAGNOSIS — I639 Cerebral infarction, unspecified: Principal | ICD-10-CM

## 2023-02-25 DIAGNOSIS — E876 Hypokalemia: Secondary | ICD-10-CM | POA: Diagnosis present

## 2023-02-25 DIAGNOSIS — I6389 Other cerebral infarction: Secondary | ICD-10-CM

## 2023-02-25 DIAGNOSIS — R9431 Abnormal electrocardiogram [ECG] [EKG]: Secondary | ICD-10-CM | POA: Diagnosis present

## 2023-02-25 DIAGNOSIS — Z8673 Personal history of transient ischemic attack (TIA), and cerebral infarction without residual deficits: Secondary | ICD-10-CM | POA: Diagnosis present

## 2023-02-25 LAB — ECHOCARDIOGRAM COMPLETE
Area-P 1/2: 2.68 cm2
Calc EF: 62.9 %
Height: 66 in
MV M vel: 4.95 m/s
MV Peak grad: 98 mmHg
MV VTI: 2.97 cm2
S' Lateral: 2.5 cm
Single Plane A2C EF: 55.1 %
Single Plane A4C EF: 66.7 %
Weight: 2560 oz

## 2023-02-25 LAB — CBC
HCT: 36.7 % (ref 36.0–46.0)
Hemoglobin: 12.5 g/dL (ref 12.0–15.0)
MCH: 32.1 pg (ref 26.0–34.0)
MCHC: 34.1 g/dL (ref 30.0–36.0)
MCV: 94.3 fL (ref 80.0–100.0)
Platelets: 253 10*3/uL (ref 150–400)
RBC: 3.89 MIL/uL (ref 3.87–5.11)
RDW: 13.8 % (ref 11.5–15.5)
WBC: 5.1 10*3/uL (ref 4.0–10.5)
nRBC: 0 % (ref 0.0–0.2)

## 2023-02-25 LAB — COMPREHENSIVE METABOLIC PANEL
ALT: 25 U/L (ref 0–44)
AST: 25 U/L (ref 15–41)
Albumin: 3.2 g/dL — ABNORMAL LOW (ref 3.5–5.0)
Alkaline Phosphatase: 65 U/L (ref 38–126)
Anion gap: 9 (ref 5–15)
BUN: 13 mg/dL (ref 8–23)
CO2: 24 mmol/L (ref 22–32)
Calcium: 8.4 mg/dL — ABNORMAL LOW (ref 8.9–10.3)
Chloride: 103 mmol/L (ref 98–111)
Creatinine, Ser: 0.73 mg/dL (ref 0.44–1.00)
GFR, Estimated: >60 mL/min (ref >60)
Glucose, Bld: 93 mg/dL (ref 70–99)
Potassium: 3.4 mmol/L — ABNORMAL LOW (ref 3.5–5.1)
Sodium: 136 mmol/L (ref 135–145)
Total Bilirubin: 0.4 mg/dL (ref 0.3–1.2)
Total Protein: 5.8 g/dL — ABNORMAL LOW (ref 6.5–8.1)

## 2023-02-25 LAB — LIPID PANEL
Cholesterol: 195 mg/dL (ref 0–200)
HDL: 66 mg/dL (ref >40)
LDL Cholesterol: 110 mg/dL — ABNORMAL HIGH (ref 0–99)
Total CHOL/HDL Ratio: 3 RATIO
Triglycerides: 94 mg/dL (ref <150)
VLDL: 19 mg/dL (ref 0–40)

## 2023-02-25 LAB — RAPID URINE DRUG SCREEN, HOSP PERFORMED
Amphetamines: POSITIVE — AB
Barbiturates: NOT DETECTED
Benzodiazepines: NOT DETECTED
Cocaine: NOT DETECTED
Opiates: NOT DETECTED
Tetrahydrocannabinol: NOT DETECTED

## 2023-02-25 LAB — HEMOGLOBIN A1C
Hgb A1c MFr Bld: 5.5 % (ref 4.8–5.6)
Mean Plasma Glucose: 111.15 mg/dL

## 2023-02-25 LAB — MAGNESIUM: Magnesium: 1.7 mg/dL (ref 1.7–2.4)

## 2023-02-25 LAB — HIV ANTIBODY (ROUTINE TESTING W REFLEX): HIV Screen 4th Generation wRfx: NONREACTIVE

## 2023-02-25 MED ORDER — POTASSIUM CHLORIDE IN NACL 20-0.9 MEQ/L-% IV SOLN
INTRAVENOUS | Status: DC
  Filled 2023-02-25: qty 1000

## 2023-02-25 MED ORDER — CLOPIDOGREL BISULFATE 75 MG PO TABS
75.0000 mg | ORAL_TABLET | Freq: Every day | ORAL | Status: DC
  Administered 2023-02-25: 75 mg via ORAL
  Filled 2023-02-25: qty 1

## 2023-02-25 MED ORDER — ENOXAPARIN SODIUM 40 MG/0.4ML IJ SOSY
40.0000 mg | PREFILLED_SYRINGE | INTRAMUSCULAR | Status: DC
  Administered 2023-02-25: 40 mg via SUBCUTANEOUS
  Filled 2023-02-25: qty 0.4

## 2023-02-25 MED ORDER — ACETAMINOPHEN 325 MG PO TABS: 650.0000 mg | ORAL_TABLET | ORAL | Status: DC | PRN

## 2023-02-25 MED ORDER — ROSUVASTATIN CALCIUM 20 MG PO TABS
20.0000 mg | ORAL_TABLET | Freq: Every day | ORAL | 0 refills | Status: DC
  Filled 2023-02-25: qty 90, 90d supply, fill #0

## 2023-02-25 MED ORDER — ASPIRIN 81 MG PO CHEW
324.0000 mg | CHEWABLE_TABLET | Freq: Once | ORAL | Status: AC
  Administered 2023-02-25: 324 mg via ORAL
  Filled 2023-02-25: qty 4

## 2023-02-25 MED ORDER — ACETAMINOPHEN 650 MG RE SUPP: 650.0000 mg | RECTAL | Status: DC | PRN

## 2023-02-25 MED ORDER — ASPIRIN 81 MG PO TBEC
81.0000 mg | DELAYED_RELEASE_TABLET | Freq: Every day | ORAL | Status: DC
  Administered 2023-02-25: 81 mg via ORAL
  Filled 2023-02-25: qty 1

## 2023-02-25 MED ORDER — IOHEXOL 350 MG/ML SOLN
75.0000 mL | Freq: Once | INTRAVENOUS | Status: AC | PRN
  Administered 2023-02-25: 75 mL via INTRAVENOUS

## 2023-02-25 MED ORDER — STUDY - OCEANIC-STROKE - ASUNDEXIAN 50 MG OR PLACEBO TABLET (PI-SETHI)
1.0000 | ORAL_TABLET | Freq: Every day | ORAL | Status: DC
  Administered 2023-02-25: 50 mg via ORAL
  Filled 2023-02-25: qty 1

## 2023-02-25 MED ORDER — PANTOPRAZOLE SODIUM 40 MG PO TBEC
40.0000 mg | DELAYED_RELEASE_TABLET | Freq: Every day | ORAL | 0 refills | Status: DC
Start: 2023-02-25 — End: 2023-09-30
  Filled 2023-02-25: qty 21, 21d supply, fill #0

## 2023-02-25 MED ORDER — ACETAMINOPHEN 160 MG/5ML PO SOLN: 650.0000 mg | ORAL | Status: DC | PRN

## 2023-02-25 MED ORDER — STUDY - OCEANIC-STROKE - ASUNDEXIAN 50 MG OR PLACEBO TABLET (PI-SETHI): 1.0000 | ORAL_TABLET | Freq: Every day | ORAL | Status: DC

## 2023-02-25 MED ORDER — STROKE: EARLY STAGES OF RECOVERY BOOK: Freq: Once | Status: DC

## 2023-02-25 MED ORDER — CLOPIDOGREL BISULFATE 75 MG PO TABS
75.0000 mg | ORAL_TABLET | Freq: Every day | ORAL | 0 refills | Status: DC
Start: 2023-02-26 — End: 2023-03-25
  Filled 2023-02-25: qty 21, 21d supply, fill #0

## 2023-02-25 MED ORDER — ASPIRIN 81 MG PO TBEC
81.0000 mg | DELAYED_RELEASE_TABLET | Freq: Every day | ORAL | 0 refills | Status: AC
  Filled 2023-02-25: qty 120, 120d supply, fill #0

## 2023-02-25 MED ORDER — CLOPIDOGREL BISULFATE 300 MG PO TABS
300.0000 mg | ORAL_TABLET | Freq: Once | ORAL | Status: AC
  Administered 2023-02-25: 300 mg via ORAL
  Filled 2023-02-25: qty 1

## 2023-02-25 MED ORDER — ROSUVASTATIN CALCIUM 20 MG PO TABS
20.0000 mg | ORAL_TABLET | Freq: Every day | ORAL | Status: DC
  Administered 2023-02-25: 20 mg via ORAL
  Filled 2023-02-25: qty 1

## 2023-02-25 NOTE — ED Notes (Signed)
Patient called out and stated she was having a new sensation of burning and tingling in the right foot. No drift present. Pulses are equal and strong. No changes in flexion. PA made aware.

## 2023-02-25 NOTE — Progress Notes (Signed)
[place] [facility] ?New Study Start: OCEANIC-STROKE ?  ?SUMMARY ?For more information refer to: [email or web address]. Study Identifier: [id number] ?   ?A Multicenter, International, Randomized, Placebo Controlled, Double-blind, Parallel Group and Event Driven Phase 3 Study of the Oral FXIa Inhibitor Asundexian ([place] [id number]) for the Prevention of Ischemic Stroke in Female and Female Participants Aged 65 Years and Older After an Acute Non-cardioembolic Ischemic Stroke or High-risk TIA  ?Brief Summary This study will randomize adult patients with an initial diagnosis of acute non-cardioembolic ischemic stroke or high-risk transient ischemic attack (TIA) to treatment within 72 hours of symptom onset and with the intention to be treated with antiplatelet therapy.  ?Design Phase 3, Randomized, Interventional, Event-Driven  ?Intervention Asundexian [phone]) 50 mg tablets or matching placebo tablets (pink, oval, film-coated tablets  ?Concomitant Therapy Participants are to receive antiplatelet therapy (single or dual; provider discretion) during the study conduct.  ?Prohibited Therapy Oral anticoagulation ?Full dose and/or long-term anticoagulation therapy with heparin or LMWH  ?Chronic (more than 4 weeks continuous) therapy with NSAIDs during the study conduct ?Concomitant use of combined P-gp and strong/moderate CYP3A4 inducers ?Concomitant use of combined P-gp and strong CYP3A4 inhibitors ?Herbal/traditional medications and/or supplements with known anticoagulant/antiplatelet effects  ?Anticoagulation Prophylaxis Venous thromboembolism prophylaxis with LMWH or unfractionated heparin for short periods of time (2 weeks) is allowed.  ?Potential Drug-Drug Interactions Rosuvastatin 20 mg daily or higher or atorvastatin 80 mg (additional monitoring may be warranted due to increased concentrations of rosuvastatin and atorvastatin - asundexian ([id number]) is a weak inhibitor of CYP2C8)  ?Administration   Can be taken irrespective of food intake. Should be swallowed whole with water, preferably in the morning. CANNOT be crushed or broken.  ?  ?  ?Plan: Start Cecille Rubin [phone]) 50 mg tablets or placebo] today.  ?  ?Please contact [facility] if any questions or concerns regarding the study medication.  ?  ?Acey Lav, PharmD, BCPS ?Investigational Drug Service Pharmacist  ?[phone] ? ?

## 2023-02-25 NOTE — Progress Notes (Signed)
[facility] PROGRESS NOTE  [name] (DOB: 1955-11-06) UJW:[id number] PCP: Pincus Sanes, MD  Brief Narrative: [name] is a 67 y.o. RHD female with a history of HTN, IBS, ADD, recent covid-19 infection who presented to the ED on 02/24/2023 with right facial droop, slurred speech followed by weakness/numbness in the right hand and foot. MRI brain confirmed small left basal ganglia ischemic CVA for which she was admitted this morning.   Subjective: No change to symptoms from admission a few hours earlier. Getting echo done during some of encounter.  Objective: BP (!) 184/102 (BP Location: Right Arm)   Pulse 78   Temp 97.8 F (36.6 C) (Oral)   Resp 15   Ht 5\' 6"  (1.676 m)   Wt 72.6 kg   SpO2 100%   BMI 25.82 kg/m   Gen: No distress Neuro: Alert and oriented. R lower facial droop affecting speech mildly, no significant weakness noted. +abnormal sensation.    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 CTA without LVO. Echo formal read pending, though embolic etiology less favored than small vessel disease.  - Antiplatelet per neurology - Start statin, LDL 110.  - DC estradiol - Will follow up formal therapy recommendations and stroke neurology.  Tyrone Nine, MD [facility] [email or web address] 02/25/2023, 11:54 AM

## 2023-02-25 NOTE — Progress Notes (Addendum)
STROKE TEAM PROGRESS NOTE   BRIEF HPI [name] is a 67 y.o. female with history of hypertension, ADD, GERD, IBS presenting with slurred speech and right facial droop with numbness.   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 MRI brain revealed acute left basal ganglia infarct LDL is 110, A1c 5.5.  She has been started on Crestor 20 mg.  She has also been started on aspirin 81 mg and Plavix 75 mg daily for 3 weeks then aspirin alone  We recommend 30-day cardiac monitor after discharge  Dr. Pearlean Brownie talked to her about the [facility] stroke trial and she is interested in receiving information and will let us know what her decision is to enroll in the study  OBJECTIVE  CBC    Component Value Date/Time   WBC 5.1 02/25/2023 0350   RBC 3.89 02/25/2023 0350   HGB 12.5 02/25/2023 0350   HCT 36.7 02/25/2023 0350   PLT 253 02/25/2023 0350   MCV 94.3 02/25/2023 0350   MCH 32.1 02/25/2023 0350   MCHC 34.1 02/25/2023 0350   RDW 13.8 02/25/2023 0350   LYMPHSABS 1.4 02/24/2023 1719   MONOABS 0.5 02/24/2023 1719   EOSABS 0.1 02/24/2023 1719   BASOSABS 0.0 02/24/2023 1719    BMET    Component Value Date/Time   NA 136 02/25/2023 0508   K 3.4 (L) 02/25/2023 0508   CL 103 02/25/2023 0508   CO2 24 02/25/2023 0508   GLUCOSE 93 02/25/2023 0508   BUN 13 02/25/2023 0508   CREATININE 0.73 02/25/2023 0508   CREATININE 0.75 03/07/2020 0904   CALCIUM 8.4 (L) 02/25/2023 0508   GFRNONAA >60 02/25/2023 0508   GFRNONAA 85 03/07/2020 0904    IMAGING past 24 hours ECHOCARDIOGRAM COMPLETE  Result Date: 02/25/2023    ECHOCARDIOGRAM REPORT   Patient Name:   [name] Date of Exam:  02/25/2023 Medical Rec #:  [id number]       Height:       66.0 in Accession #:    [id number]      Weight:       160.0 lb Date of Birth:  1956-01-31       BSA:          1.819 m Patient Age:    67 years        BP:           184/102 mmHg Patient Gender: F               HR:           78 bpm. Exam Location:  Inpatient Procedure: 2D Echo, Color Doppler and Cardiac Doppler Indications:    Stroke  History:        Patient has no prior history of Echocardiogram examinations.                 Risk Factors:Hypertension, Former Smoker and Family History of                 Coronary Artery Disease.  Sonographer:    Milda Smart Referring Phys: [id number] TIMOTHY S OPYD  Sonographer Comments: Image acquisition challenging due to respiratory motion. IMPRESSIONS  1. Left ventricular ejection fraction,  by estimation, is 60 to 65%. The left ventricle has normal function. The left ventricle has no regional wall motion abnormalities. There is mild concentric left ventricular hypertrophy. Left ventricular diastolic parameters were normal.  2. Right ventricular systolic function is normal. The right ventricular size is normal.  3. The mitral valve is normal in structure. No evidence of mitral valve regurgitation. No evidence of mitral stenosis.  4. The aortic valve is tricuspid. Aortic valve regurgitation is not visualized. No aortic stenosis is present.  5. Aortic dilatation noted. There is borderline dilatation of the ascending aorta, measuring 38 mm.  6. The inferior vena cava is normal in size with greater than 50% respiratory variability, suggesting right atrial pressure of 3 mmHg. FINDINGS  Left Ventricle: Left ventricular ejection fraction, by estimation, is 60 to 65%. The left ventricle has normal function. The left ventricle has no regional wall motion abnormalities. The left ventricular internal cavity size was normal in size. There is  mild concentric left ventricular hypertrophy. Left ventricular diastolic parameters were normal.  Right Ventricle: The right ventricular size is normal. No increase in right ventricular wall thickness. Right ventricular systolic function is normal. Left Atrium: Left atrial size was normal in size. Right Atrium: Right atrial size was normal in size. Pericardium: There is no evidence of pericardial effusion. Mitral Valve: The mitral valve is normal in structure. No evidence of mitral valve regurgitation. No evidence of mitral valve stenosis. MV peak gradient, 5.2 mmHg. The mean mitral valve gradient is 3.0 mmHg. Tricuspid Valve: The tricuspid valve is normal in structure. Tricuspid valve regurgitation is not demonstrated. No evidence of tricuspid stenosis. Aortic Valve: The aortic valve is tricuspid. Aortic valve regurgitation is not visualized. No aortic stenosis is present. Pulmonic Valve: The pulmonic valve was normal in structure. Pulmonic valve regurgitation is not visualized. No evidence of pulmonic stenosis. Aorta: Aortic dilatation noted and the aortic root is normal in size and structure. There is borderline dilatation of the ascending aorta, measuring 38 mm. Venous: The inferior vena cava is normal in size with greater than 50% respiratory variability, suggesting right atrial pressure of 3 mmHg. IAS/Shunts: No atrial level shunt detected by color flow Doppler.  LEFT VENTRICLE PLAX 2D LVIDd:         3.60 cm     Diastology LVIDs:         2.50 cm     LV e' medial:    7.07 cm/s LV PW:         1.20 cm     LV E/e' medial:  12.6 LV IVS:        1.20 cm     LV e' lateral:   9.14 cm/s LVOT diam:     2.10 cm     LV E/e' lateral: 9.8 LV SV:         88 LV SV Index:   49 LVOT Area:     3.46 cm  LV Volumes (MOD) LV vol d, MOD A2C: 45.7 ml LV vol d, MOD A4C: 91.0 ml LV vol s, MOD A2C: 20.5 ml LV vol s, MOD A4C: 30.3 ml LV SV MOD A2C:     25.2 ml LV SV MOD A4C:     91.0 ml LV SV MOD BP:      44.2 ml RIGHT VENTRICLE             IVC RV Basal diam:  3.70 cm     IVC diam: 1.30 cm RV Mid diam:  2.00 cm RV S prime:      12.10 cm/s LEFT ATRIUM             Index        RIGHT ATRIUM           Index LA diam:        3.70 cm 2.03 cm/m   RA Area:     14.50 cm LA Vol (A2C):   45.7 ml 25.13 ml/m  RA Volume:   37.70 ml  20.73 ml/m LA Vol (A4C):   37.2 ml 20.48 ml/m LA Biplane Vol: 47.7 ml 26.23 ml/m  AORTIC VALVE LVOT Vmax:   119.00 cm/s LVOT Vmean:  88.500 cm/s LVOT VTI:    0.255 m  AORTA Ao Root diam: 3.30 cm Ao Asc diam:  3.80 cm MITRAL VALVE               TRICUSPID VALVE MV Area (PHT): 2.68 cm    TR Peak grad:   33.2 mmHg MV Area VTI:   2.97 cm    TR Mean grad:   22.0 mmHg MV Peak grad:  5.2 mmHg    TR Vmax:        288.00 cm/s MV Mean grad:  3.0 mmHg    TR Vmean:       219.0 cm/s MV Vmax:       1.14 m/s MV Vmean:      83.9 cm/s   SHUNTS MV Decel Time: 283 msec    Systemic VTI:  0.26 m MR Peak grad: 98.0 mmHg    Systemic Diam: 2.10 cm MR Vmax:      495.00 cm/s MV E velocity: 89.20 cm/s MV A velocity: 99.40 cm/s MV E/A ratio:  0.90 Arvilla Meres MD Electronically signed by Arvilla Meres MD Signature Date/Time: 02/25/2023/11:53:44 AM    Final    CT ANGIO HEAD NECK W WO CM  Result Date: 02/25/2023 CLINICAL DATA:  Stroke, determine embolic source. Right facial droop and slurred speech with right facial numbness. EXAM: CT ANGIOGRAPHY HEAD AND NECK WITH AND WITHOUT CONTRAST TECHNIQUE: Multidetector CT imaging of the head and neck was performed using the standard protocol during bolus administration of intravenous contrast. Multiplanar CT image reconstructions and MIPs were obtained to evaluate the vascular anatomy. Carotid stenosis measurements (when applicable) are obtained utilizing NASCET criteria, using the distal internal carotid diameter as the denominator. RADIATION DOSE REDUCTION: This exam was performed according to the departmental dose-optimization program which includes automated exposure control, adjustment of the mA and/or kV according to patient size and/or use of iterative reconstruction technique. CONTRAST:   75mL OMNIPAQUE IOHEXOL 350 MG/ML SOLN COMPARISON:  Brain MRI from earlier the same day FINDINGS: CT HEAD FINDINGS Brain: The patient's acute lacunar infarct is currently occult by head CT. No hemorrhage, hydrocephalus, or masslike finding. Chronic small vessel ischemic gliosis in the cerebral white matter. Vascular: See below Skull: Negative Sinuses/Orbits: Negative Review of the MIP images confirms the above findings CTA NECK FINDINGS Aortic arch: Negative Right carotid system: Limited atheromatous plaque at the bifurcation. No stenosis, ulceration, or dissection. Left carotid system: No stenosis, ulceration, or dissection. Vertebral arteries: Notable tortuosity of the vertebral arteries. No subclavian or vertebral stenosis or ulceration. Skeleton: Generalized cervical spine degeneration. Mild C4-5 anterolisthesis Other neck: Negative Upper chest: Clear apical lungs Review of the MIP images confirms the above findings CTA HEAD FINDINGS Anterior circulation: No significant stenosis, proximal occlusion, aneurysm, or vascular malformation. Posterior circulation: No significant stenosis, proximal occlusion, aneurysm,  or vascular malformation. Venous sinuses: As permitted by contrast timing, patent. Review of the MIP images confirms the above findings IMPRESSION: No emergent finding. Limited atheromatous change for age without stenosis or irregularity of major arteries in the head and neck. Electronically Signed   By: Tiburcio Pea M.D.   On: 02/25/2023 04:30   MR BRAIN WO CONTRAST  Result Date: 02/25/2023 CLINICAL DATA:  Transient ischemic attack EXAM: MRI HEAD WITHOUT CONTRAST TECHNIQUE: Multiplanar, multiecho pulse sequences of the brain and surrounding structures were obtained without intravenous contrast. COMPARISON:  08/09/2003 FINDINGS: Brain: Small focus of acute ischemia within the left basal ganglia. Chronic microhemorrhage in the left basal ganglia. There is multifocal hyperintense T2-weighted signal  within the white matter. Parenchymal volume and CSF spaces are normal. The midline structures are normal. Vascular: Major flow voids are preserved. Skull and upper cervical spine: Normal calvarium and skull base. Visualized upper cervical spine and soft tissues are normal. Sinuses/Orbits:No paranasal sinus fluid levels or advanced mucosal thickening. No mastoid or middle ear effusion. Normal orbits. IMPRESSION: 1. Small focus of acute ischemia within the left basal ganglia. No hemorrhage or mass effect. 2. Findings of chronic small vessel ischemia. Electronically Signed   By: Deatra Robinson M.D.   On: 02/25/2023 01:32   CT HEAD WO CONTRAST  Result Date: 02/24/2023 CLINICAL DATA:  Neuro deficit, acute, stroke suspected. EXAM: CT HEAD WITHOUT CONTRAST TECHNIQUE: Contiguous axial images were obtained from the base of the skull through the vertex without intravenous contrast. RADIATION DOSE REDUCTION: This exam was performed according to the departmental dose-optimization program which includes automated exposure control, adjustment of the mA and/or kV according to patient size and/or use of iterative reconstruction technique. COMPARISON:  MRI 08/15/2003 FINDINGS: Brain: No abnormality seen affecting the brainstem. Few old small vessel cerebellar infarctions. Cerebral hemispheres show mild chronic small-vessel ischemic change of the white matter. No cortical or large vessel territory infarction. No mass lesion, hemorrhage, hydrocephalus or extra-axial collection. Vascular: There is atherosclerotic calcification of the major vessels at the base of the brain. Skull: Negative Sinuses/Orbits: Clear/normal Other: None IMPRESSION: No acute CT finding. Mild chronic small-vessel ischemic changes of the cerebral hemispheric white matter. Few old small vessel cerebellar infarctions. Electronically Signed   By: Paulina Fusi M.D.   On: 02/24/2023 18:37    Vitals:   02/25/23 1045 02/25/23 1210 02/25/23 1219 02/25/23 1222  BP:   (!) 172/92 (!) 170/96   Pulse:  72 75   Resp: 15 (!) 21 19   Temp:    97.8 F (36.6 C)  TempSrc:    Oral  SpO2:  99% 99%   Weight:      Height:         PHYSICAL EXAM General:  Alert, well-nourished, well-developed patient in no acute distress Psych:  Mood and affect appropriate for situation CV: Regular rate and rhythm on monitor Respiratory:  Regular, unlabored respirations on room air GI: Abdomen soft and nontender   NEURO:  Mental Status: AA&Ox3, patient is able to give clear and coherent history.  Mild dysarthria Speech/Language: no aphasia.  Naming, repetition, fluency, and comprehension intact.  Cranial Nerves:  II: PERRL. Visual fields full.  III, IV, VI: EOMI. Eyelids elevate symmetrically.  V: Right face numbness VII: Right facial moderate weakness. VIII: hearing intact to voice. IX, X: Palate elevates symmetrically. Phonation is normal.  GN:FAOZHYQM shrug 5/5. XII: tongue is midline without fasciculations. Motor: 5/5 strength to all muscle groups tested.  Tone: is normal and bulk  is normal Sensation- Intact to light touch bilaterally. Mild right lower facial decree sensation.  Extinction absent to light touch to DSS.   Coordination: FTN intact bilaterally, HKS: no ataxia in BLE.No drift.  Gait- deferred  NIHSS 4.  Premorbid modified Rankin score 0  ASSESSMENT/PLAN  Acute Ischemic Infarct: Left basal ganglia  Etiology: Small vessel disease CT head No acute abnormality.  CTA head & neck no LVO MRI small left basal ganglia 2D Echo EF 60 to 65%.  Left ventricle with mild concentric hypertrophy Recommend 30-day heart monitor after discharge LDL 110 HgbA1c 5.5 VTE prophylaxis -Lovenox No antithrombotics prior to admission, now on aspirin 81 mg and Plavix 75 mg daily for 3 weeks and then aspirin alone. Therapy recommendations: Pending Disposition: Pending  Hypertension Home meds: Triamterene-hydrochlorothiazide 37.5-25 mg, verapamil 120 mg Stable Blood  Pressure Goal: BP less than 220/110   Hyperlipidemia Home meds: None LDL 110, goal < 70 Add Crestor 20 mg Continue statin at discharge   Dysphagia Patient has post-stroke dysphagia, SLP consulted    Diet   Diet Heart Room service appropriate? Yes; Fluid consistency: Thin   Advance diet as tolerated  Hospital day # 0  Gevena Mart DNP, ACNPC-AG  [facility]  STROKE MD NOTE :  I have personally obtained history,examined this patient, reviewed notes, independently viewed imaging studies, participated in medical decision making and plan of care.ROS completed by me personally and pertinent positives fully documented  I have made any additions or clarifications directly to the above note. Agree with note above.  Patient presented with sudden onset of dysarthria facial weakness and numbness secondary to left internal capsule infarct likely from small vessel disease.  Recommend aspirin and Plavix for 3 weeks followed by aspirin alone and aggressive risk factor modification.  Continue ongoing stroke workup.  Patient may also consider possible participation in the [facility]  stroke prevention study(standard of care antiplatelet therapy with and without factor XI inhibitor Asundexian ).  Patient expressed interest and was given written information to review.  She was clearly informed that study participation is voluntary and she will get the same excellent medical care irrespective of whether she chooses to participate in the study and.  She can withdraw participation or consent from the study at any point in the future if she is not satisfied and changes her mind.  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 Discussed with Dr. Alycia Rossetti.  Greater than 50% time during this 50-minute visit was spent in counseling and coordination of care about her lacunar stroke and discussion about stroke evaluation, prevention and treatment and answering  questions.  Delia Heady, MD Medical Director [facility] Pager: [phone] 02/25/2023 3:17 PM   To contact Stroke Continuity provider, please refer to [email or web address]. After hours, contact General Neurology

## 2023-02-25 NOTE — Evaluation (Signed)
Physical Therapy Evaluation Patient Details Name: [name] MRN: [id number] DOB: 1955-09-16 Today's Date: 02/25/2023  History of Present Illness  Patient is 67 y.o. female presenting with Rt facial droop and dysarthria. MRI revealed small focus of acute ischemia within the left basal ganglia; no hemorrhage or mass effect. PMH significant for hypertension on verapamil, ADD on stimulants, hormone replacement therapy, irritable bowel syndrome, Raynaud's.   Clinical Impression  [name] is 67 y.o. female admitted with above HPI and diagnosis. Patient is currently limited by functional impairments below (see PT problem list). Patient lives alone and is independent at baseline. Currently pt noted to have greater Rt UE weakness than LE and impaired finger to nose on Rt UE. Pt was able to complete transfers and gait at min guard fading to supervision level with no AD and no LOB noted. Pt also continues to have difficulty with expressive speech. Patient will benefit from continued skilled PT interventions to address impairments and progress independence with mobility; recommending OP therapy follow up. Acute PT will follow and progress as able.         Assistance Recommended at Discharge PRN  If plan is discharge home, recommend the following:  Can travel by private vehicle  A little help with walking and/or transfers;A little help with bathing/dressing/bathroom;Assist for transportation        Equipment Recommendations None recommended by PT  Recommendations for Other Services       Functional Status Assessment Patient has had a recent decline in their functional status and demonstrates the ability to make significant improvements in function in a reasonable and predictable amount of time.     Precautions / Restrictions Precautions Precautions: Fall Restrictions Weight Bearing Restrictions: No      Mobility  Bed Mobility Overal bed mobility: Modified Independent                   Transfers Overall transfer level: Needs assistance Equipment used: None Transfers: Sit to/from Stand Sit to Stand: Supervision           General transfer comment: supervision for safety to rise from EOB    Ambulation/Gait Ambulation/Gait assistance: Min guard Gait Distance (Feet): 200 Feet Assistive device: None Gait Pattern/deviations: Step-through pattern, Decreased stride length Gait velocity: decr     General Gait Details: min guard for safety, no UE support and no LOB noted throughout, pt fadign to supervision with gait.  Stairs            Wheelchair Mobility     Tilt Bed    Modified Rankin (Stroke Patients Only) Modified Rankin (Stroke Patients Only) Pre-Morbid Rankin Score: No symptoms Modified Rankin: No significant disability     Balance Overall balance assessment: Needs assistance Sitting-balance support: No upper extremity supported, Feet unsupported Sitting balance-Leahy Scale: Good     Standing balance support: During functional activity, No upper extremity supported Standing balance-Leahy Scale: Good                               Pertinent Vitals/Pain Pain Assessment Pain Assessment: No/denies pain    Home Living Family/patient expects to be discharged to:: Private residence Living Arrangements: Alone Available Help at Discharge: Family;Neighbor;Friend(s) (3 sons; 1 son in the area and 2 in [place], also a business partner and neighbor who can help) Type of Home: House Home Access: Level entry Entrance Stairs-Rails: None Entrance Stairs-Number of Steps: small threshold   Home Layout:  One level Home Equipment: None Additional Comments: has access to her mothers DME (walker and cnae) in attic    Prior Function Prior Level of Function : Independent/Modified Independent;Working/employed;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 RLE Sensation:  (numbness/tingling in Rt foot) RLE Coordination: WNL LLE Deficits / Details: 4+/5 hip flex, knee ext/flex, DF/PF, smooth heel to shin LLE Sensation: WNL LLE Coordination: WNL    Cervical / Trunk Assessment Cervical / Trunk Assessment: Normal  Communication   Communication: Expressive difficulties  Cognition Arousal/Alertness: Awake/alert Behavior During Therapy: WFL for tasks assessed/performed Overall Cognitive Status: Within Functional Limits for tasks assessed                                          General Comments      Exercises     Assessment/Plan    PT Assessment Patient needs continued PT services  PT Problem List Decreased strength;Decreased mobility;Decreased coordination;Decreased balance;Decreased cognition       PT Treatment Interventions DME instruction;Patient/family education;Cognitive remediation;Neuromuscular re-education;Balance training;Therapeutic exercise;Therapeutic activities;Functional mobility training;Stair training;Gait training    PT Goals (Current goals can be found in the Care Plan section)  Acute Rehab PT Goals Patient Stated Goal: get home and back to daily activities PT Goal Formulation: With patient Time For Goal Achievement: 03/11/23 Potential to Achieve Goals: Good    Frequency Min 3X/week     Co-evaluation               AM-PAC PT "6 Clicks" Mobility  Outcome Measure Help needed turning from your back to your side while in a flat bed without using bedrails?: None Help needed moving from lying on your  back to sitting on the side of a flat bed without using bedrails?: None Help needed moving to and from a bed to a chair (including a wheelchair)?: A Little Help needed standing up from a chair using your arms (e.g., wheelchair or bedside chair)?: A Little Help needed to walk in hospital room?: A Little Help needed climbing 3-5 steps with a railing? : A Little 6 Click Score: 20    End of Session Equipment Utilized During Treatment: Gait belt Activity Tolerance: Patient tolerated treatment well;Treatment limited secondary to medical complications (Comment) (beyond permissive HTN wiht automatic cuff, (manual BP taken and 184/102 mmHg)) Patient left: in bed;with call bell/phone within reach Nurse Communication: Mobility status PT Visit Diagnosis: Muscle weakness (generalized) (M62.81);Other symptoms and signs involving the nervous system (R29.898)    Time: 1610-9604 PT Time Calculation (min) (ACUTE ONLY): 31 min   Charges:   PT Evaluation $PT Eval Moderate Complexity: 1 Mod PT Treatments $Gait Training: 8-22 mins PT General Charges $$ ACUTE PT VISIT: 1 Visit         Wynn Maudlin, DPT Acute Rehabilitation Services Office [phone]  02/25/23 12:56 PM

## 2023-02-25 NOTE — ED Notes (Signed)
ED TO INPATIENT HANDOFF REPORT  ED Nurse Name and Phone #: Leland Johns [phone]  S Name/Age/Gender [name] 67 y.o. female Room/Bed: 007C/007C  Code Status   Code Status: Full Code  Home/SNF/Other Home Patient oriented to: self, place, time, and situation Is this baseline? Yes   Triage Complete: Triage complete  Chief Complaint Acute ischemic stroke [facility]) [I63.9]  Triage Note Pt arrives with c/o slurred speech, right sided facial droop, and sensation deficit on right of her face. Per pt, LSN was at 10:30 this morning. Pt denies focal weakness. Pt was at work when symptoms started.    Allergies Allergies  Allergen Reactions   Nifedipine Other (See Comments)    Increased HR   Shellfish Allergy Other (See Comments)    One side of face goes paralyzed     Level of Care/Admitting Diagnosis ED Disposition     ED Disposition  Admit   Condition  --   Comment  Hospital Area: [facility] [[id number]]  Level of Care: Telemetry Medical [[id number]]  May place patient in observation at [facility] or [facility] if equivalent level of care is available:: No  Covid Evaluation: Recent COVID positive no isolation required infection day 21-90  Diagnosis: Acute ischemic stroke [facility]) [[id number]]  Admitting Physician: Briscoe Deutscher [[id number]]  Attending Physician: Briscoe Deutscher [[id number]]          B Medical/Surgery History Past Medical History:  Diagnosis Date   ADD    ALLERGIC RHINITIS    GERD    HOMOCYSTINEMIA    HYPERTENSION    IBS    Incidental lung nodule 02/2011 ct   unchanged 07/16/11 CT, 6 mo f/u  planned   Past Surgical History:  Procedure Laterality Date   ABDOMINAL HYSTERECTOMY     Bladder tack  2001     A IV Location/Drains/Wounds Patient Lines/Drains/Airways Status     Active Line/Drains/Airways     Name Placement date Placement time Site Days   Peripheral IV 02/24/23 18 G Left Antecubital 02/24/23  1903  Antecubital  1             Intake/Output Last 24 hours  Intake/Output Summary (Last 24 hours) at 02/25/2023 1157 Last data filed at 02/24/2023 2032 Gross per 24 hour  Intake 100 ml  Output --  Net 100 ml    Labs/Imaging Results for orders placed or performed during the hospital encounter of 02/24/23 (from the past 48 hour(s))  CBG monitoring, ED     Status: Abnormal   Collection Time: 02/24/23  5:18 PM  Result Value Ref Range   Glucose-Capillary 105 (H) 70 - 99 mg/dL    Comment: Glucose reference range applies only to samples taken after fasting for at least 8 hours.  Protime-INR     Status: None   Collection Time: 02/24/23  5:19 PM  Result Value Ref Range   Prothrombin Time 12.2 11.4 - 15.2 seconds   INR 0.9 0.8 - 1.2    Comment: (NOTE) INR goal varies based on device and disease states. Performed at [facility], [address] [place], [place] [address]   APTT     Status: Abnormal   Collection Time: 02/24/23  5:19 PM  Result Value Ref Range   aPTT 23 (L) 24 - 36 seconds    Comment: Performed at [facility], [address] [place], [place] [address]  CBC     Status: None  Collection Time: 02/24/23  5:19 PM  Result Value Ref Range   WBC 5.0 4.0 - 10.5 K/uL   RBC 4.30 3.87 - 5.11 MIL/uL   Hemoglobin 13.7 12.0 - 15.0 g/dL   HCT 16.1 09.6 - 04.5 %   MCV 91.2 80.0 - 100.0 fL   MCH 31.9 26.0 - 34.0 pg   MCHC 34.9 30.0 - 36.0 g/dL   RDW 40.9 81.1 - 91.4 %   Platelets 293 150 - 400 K/uL   nRBC 0.0 0.0 - 0.2 %    Comment: Performed at [facility], [address] [place], [place] [address]  Differential     Status: None   Collection Time: 02/24/23  5:19 PM  Result Value Ref Range   Neutrophils Relative % 58 %   Neutro Abs 3.0 1.7 - 7.7 K/uL   Lymphocytes Relative 28 %   Lymphs Abs 1.4 0.7 - 4.0 K/uL   Monocytes Relative 10 %   Monocytes Absolute 0.5 0.1 - 1.0 K/uL   Eosinophils Relative 2 %   Eosinophils Absolute 0.1 0.0 - 0.5 K/uL   Basophils  Relative 1 %   Basophils Absolute 0.0 0.0 - 0.1 K/uL   Immature Granulocytes 1 %   Abs Immature Granulocytes 0.06 0.00 - 0.07 K/uL    Comment: Performed at [facility], [address] [place], [place] [address]  Comprehensive metabolic panel     Status: Abnormal   Collection Time: 02/24/23  5:19 PM  Result Value Ref Range   Sodium 137 135 - 145 mmol/L   Potassium 3.0 (L) 3.5 - 5.1 mmol/L   Chloride 100 98 - 111 mmol/L   CO2 26 22 - 32 mmol/L   Glucose, Bld 107 (H) 70 - 99 mg/dL    Comment: Glucose reference range applies only to samples taken after fasting for at least 8 hours.   BUN 17 8 - 23 mg/dL   Creatinine, Ser 8.65 0.44 - 1.00 mg/dL   Calcium 9.2 8.9 - 78.4 mg/dL   Total Protein 7.4 6.5 - 8.1 g/dL   Albumin 4.1 3.5 - 5.0 g/dL   AST 27 15 - 41 U/L   ALT 28 0 - 44 U/L   Alkaline Phosphatase 76 38 - 126 U/L   Total Bilirubin 0.3 0.3 - 1.2 mg/dL   GFR, Estimated >69 >62 mL/min    Comment: (NOTE) Calculated using the CKD-EPI Creatinine Equation (2021)    Anion gap 11 5 - 15    Comment: Performed at [facility], [address] [place], [place] [address]  Ethanol     Status: None   Collection Time: 02/24/23  6:02 PM  Result Value Ref Range   Alcohol, Ethyl (B) <10 <10 mg/dL    Comment: (NOTE) Lowest detectable limit for serum alcohol is 10 mg/dL.  For medical purposes only. Performed at [facility], [address] [place], [place] [address]   HIV Antibody (routine testing w rflx)     Status: None   Collection Time: 02/25/23  3:50 AM  Result Value Ref Range   HIV Screen 4th Generation wRfx Non Reactive Non Reactive    Comment: Performed at [facility], [address], [place], [place] [address]  Hemoglobin A1c     Status: None   Collection Time: 02/25/23  3:50 AM  Result Value Ref Range   Hgb A1c MFr Bld 5.5 4.8 - 5.6 %  Comment: (NOTE) Pre diabetes:          5.7%-6.4%  Diabetes:              >6.4%  Glycemic control  for   <7.0% adults with diabetes    Mean Plasma Glucose 111.15 mg/dL    Comment: Performed at [facility], [address], [place], [place] [address]  CBC     Status: None   Collection Time: 02/25/23  3:50 AM  Result Value Ref Range   WBC 5.1 4.0 - 10.5 K/uL   RBC 3.89 3.87 - 5.11 MIL/uL   Hemoglobin 12.5 12.0 - 15.0 g/dL   HCT 29.5 28.4 - 13.2 %   MCV 94.3 80.0 - 100.0 fL   MCH 32.1 26.0 - 34.0 pg   MCHC 34.1 30.0 - 36.0 g/dL   RDW 44.0 10.2 - 72.5 %   Platelets 253 150 - 400 K/uL   nRBC 0.0 0.0 - 0.2 %    Comment: Performed at [facility], [address] [place], [place] [address]  Comprehensive metabolic panel     Status: Abnormal   Collection Time: 02/25/23  5:08 AM  Result Value Ref Range   Sodium 136 135 - 145 mmol/L   Potassium 3.4 (L) 3.5 - 5.1 mmol/L   Chloride 103 98 - 111 mmol/L   CO2 24 22 - 32 mmol/L   Glucose, Bld 93 70 - 99 mg/dL    Comment: Glucose reference range applies only to samples taken after fasting for at least 8 hours.   BUN 13 8 - 23 mg/dL   Creatinine, Ser 0.34 0.44 - 1.00 mg/dL   Calcium 8.4 (L) 8.9 - 10.3 mg/dL   Total Protein 5.8 (L) 6.5 - 8.1 g/dL   Albumin 3.2 (L) 3.5 - 5.0 g/dL   AST 25 15 - 41 U/L   ALT 25 0 - 44 U/L   Alkaline Phosphatase 65 38 - 126 U/L   Total Bilirubin 0.4 0.3 - 1.2 mg/dL   GFR, Estimated >74 >25 mL/min    Comment: (NOTE) Calculated using the CKD-EPI Creatinine Equation (2021)    Anion gap 9 5 - 15    Comment: Performed at [facility], [address], [place], [place] [address]  Lipid panel     Status: Abnormal   Collection Time: 02/25/23  5:08 AM  Result Value Ref Range   Cholesterol 195 0 - 200 mg/dL   Triglycerides 94 <756 mg/dL   HDL 66 >43 mg/dL   Total CHOL/HDL Ratio 3.0 RATIO   VLDL 19 0 - 40 mg/dL   LDL Cholesterol 329 (H) 0 - 99 mg/dL    Comment:        Total Cholesterol/HDL:CHD Risk Coronary Heart Disease Risk Table                     Men   Women  1/2 Average Risk   3.4    3.3  Average Risk       5.0   4.4  2 X Average Risk   9.6   7.1  3 X Average Risk  23.4   11.0        Use the calculated Patient Ratio above and the CHD Risk Table to determine the patient's CHD Risk.        ATP III CLASSIFICATION (LDL):  <100     mg/dL   Optimal  518-841  mg/dL   Near or  Above                    Optimal  130-159  mg/dL   Borderline  161-096  mg/dL   High  >045     mg/dL   Very High Performed at [facility], [address] [place], [place] [address]   Magnesium     Status: None   Collection Time: 02/25/23  5:08 AM  Result Value Ref Range   Magnesium 1.7 1.7 - 2.4 mg/dL    Comment: Performed at [facility], [address], [place], [place] [address]   ECHOCARDIOGRAM COMPLETE  Result Date: 02/25/2023    ECHOCARDIOGRAM REPORT   Patient Name:   [name] Date of Exam: 02/25/2023 Medical Rec #:  [id number]       Height:       66.0 in Accession #:    [id number]      Weight:       160.0 lb Date of Birth:  Sep 08, 1955       BSA:          1.819 m Patient Age:    67 years        BP:           184/102 mmHg Patient Gender: F               HR:           78 bpm. Exam Location:  Inpatient Procedure: 2D Echo, Color Doppler and Cardiac Doppler Indications:    Stroke  History:        Patient has no prior history of Echocardiogram examinations.                 Risk Factors:Hypertension, Former Smoker and Family History of                 Coronary Artery Disease.  Sonographer:    Milda Smart Referring Phys: [id number] TIMOTHY S OPYD  Sonographer Comments: Image acquisition challenging due to respiratory motion. IMPRESSIONS  1. Left ventricular ejection fraction, by estimation, is 60 to 65%. The left ventricle has normal function. The left ventricle has no regional wall motion abnormalities. There is mild concentric left ventricular hypertrophy. Left ventricular diastolic parameters were normal.  2. Right ventricular systolic function is normal. The right ventricular size is  normal.  3. The mitral valve is normal in structure. No evidence of mitral valve regurgitation. No evidence of mitral stenosis.  4. The aortic valve is tricuspid. Aortic valve regurgitation is not visualized. No aortic stenosis is present.  5. Aortic dilatation noted. There is borderline dilatation of the ascending aorta, measuring 38 mm.  6. The inferior vena cava is normal in size with greater than 50% respiratory variability, suggesting right atrial pressure of 3 mmHg. FINDINGS  Left Ventricle: Left ventricular ejection fraction, by estimation, is 60 to 65%. The left ventricle has normal function. The left ventricle has no regional wall motion abnormalities. The left ventricular internal cavity size was normal in size. There is  mild concentric left ventricular hypertrophy. Left ventricular diastolic parameters were normal. Right Ventricle: The right ventricular size is normal. No increase in right ventricular wall thickness. Right ventricular systolic function is normal. Left Atrium: Left atrial size was normal in size. Right Atrium: Right atrial size was normal in size. Pericardium: There is no evidence of pericardial effusion. Mitral Valve: The mitral valve is normal in structure. No evidence of mitral valve regurgitation.  No evidence of mitral valve stenosis. MV peak gradient, 5.2 mmHg. The mean mitral valve gradient is 3.0 mmHg. Tricuspid Valve: The tricuspid valve is normal in structure. Tricuspid valve regurgitation is not demonstrated. No evidence of tricuspid stenosis. Aortic Valve: The aortic valve is tricuspid. Aortic valve regurgitation is not visualized. No aortic stenosis is present. Pulmonic Valve: The pulmonic valve was normal in structure. Pulmonic valve regurgitation is not visualized. No evidence of pulmonic stenosis. Aorta: Aortic dilatation noted and the aortic root is normal in size and structure. There is borderline dilatation of the ascending aorta, measuring 38 mm. Venous: The inferior  vena cava is normal in size with greater than 50% respiratory variability, suggesting right atrial pressure of 3 mmHg. IAS/Shunts: No atrial level shunt detected by color flow Doppler.  LEFT VENTRICLE PLAX 2D LVIDd:         3.60 cm     Diastology LVIDs:         2.50 cm     LV e' medial:    7.07 cm/s LV PW:         1.20 cm     LV E/e' medial:  12.6 LV IVS:        1.20 cm     LV e' lateral:   9.14 cm/s LVOT diam:     2.10 cm     LV E/e' lateral: 9.8 LV SV:         88 LV SV Index:   49 LVOT Area:     3.46 cm  LV Volumes (MOD) LV vol d, MOD A2C: 45.7 ml LV vol d, MOD A4C: 91.0 ml LV vol s, MOD A2C: 20.5 ml LV vol s, MOD A4C: 30.3 ml LV SV MOD A2C:     25.2 ml LV SV MOD A4C:     91.0 ml LV SV MOD BP:      44.2 ml RIGHT VENTRICLE             IVC RV Basal diam:  3.70 cm     IVC diam: 1.30 cm RV Mid diam:    2.00 cm RV S prime:     12.10 cm/s LEFT ATRIUM             Index        RIGHT ATRIUM           Index LA diam:        3.70 cm 2.03 cm/m   RA Area:     14.50 cm LA Vol (A2C):   45.7 ml 25.13 ml/m  RA Volume:   37.70 ml  20.73 ml/m LA Vol (A4C):   37.2 ml 20.48 ml/m LA Biplane Vol: 47.7 ml 26.23 ml/m  AORTIC VALVE LVOT Vmax:   119.00 cm/s LVOT Vmean:  88.500 cm/s LVOT VTI:    0.255 m  AORTA Ao Root diam: 3.30 cm Ao Asc diam:  3.80 cm MITRAL VALVE               TRICUSPID VALVE MV Area (PHT): 2.68 cm    TR Peak grad:   33.2 mmHg MV Area VTI:   2.97 cm    TR Mean grad:   22.0 mmHg MV Peak grad:  5.2 mmHg    TR Vmax:        288.00 cm/s MV Mean grad:  3.0 mmHg    TR Vmean:       219.0 cm/s MV Vmax:       1.14 m/s MV Vmean:  83.9 cm/s   SHUNTS MV Decel Time: 283 msec    Systemic VTI:  0.26 m MR Peak grad: 98.0 mmHg    Systemic Diam: 2.10 cm MR Vmax:      495.00 cm/s MV E velocity: 89.20 cm/s MV A velocity: 99.40 cm/s MV E/A ratio:  0.90 Arvilla Meres MD Electronically signed by Arvilla Meres MD Signature Date/Time: 02/25/2023/11:53:44 AM    Final    CT ANGIO HEAD NECK W WO CM  Result Date:  02/25/2023 CLINICAL DATA:  Stroke, determine embolic source. Right facial droop and slurred speech with right facial numbness. EXAM: CT ANGIOGRAPHY HEAD AND NECK WITH AND WITHOUT CONTRAST TECHNIQUE: Multidetector CT imaging of the head and neck was performed using the standard protocol during bolus administration of intravenous contrast. Multiplanar CT image reconstructions and MIPs were obtained to evaluate the vascular anatomy. Carotid stenosis measurements (when applicable) are obtained utilizing NASCET criteria, using the distal internal carotid diameter as the denominator. RADIATION DOSE REDUCTION: This exam was performed according to the departmental dose-optimization program which includes automated exposure control, adjustment of the mA and/or kV according to patient size and/or use of iterative reconstruction technique. CONTRAST:  75mL OMNIPAQUE IOHEXOL 350 MG/ML SOLN COMPARISON:  Brain MRI from earlier the same day FINDINGS: CT HEAD FINDINGS Brain: The patient's acute lacunar infarct is currently occult by head CT. No hemorrhage, hydrocephalus, or masslike finding. Chronic small vessel ischemic gliosis in the cerebral white matter. Vascular: See below Skull: Negative Sinuses/Orbits: Negative Review of the MIP images confirms the above findings CTA NECK FINDINGS Aortic arch: Negative Right carotid system: Limited atheromatous plaque at the bifurcation. No stenosis, ulceration, or dissection. Left carotid system: No stenosis, ulceration, or dissection. Vertebral arteries: Notable tortuosity of the vertebral arteries. No subclavian or vertebral stenosis or ulceration. Skeleton: Generalized cervical spine degeneration. Mild C4-5 anterolisthesis Other neck: Negative Upper chest: Clear apical lungs Review of the MIP images confirms the above findings CTA HEAD FINDINGS Anterior circulation: No significant stenosis, proximal occlusion, aneurysm, or vascular malformation. Posterior circulation: No significant  stenosis, proximal occlusion, aneurysm, or vascular malformation. Venous sinuses: As permitted by contrast timing, patent. Review of the MIP images confirms the above findings IMPRESSION: No emergent finding. Limited atheromatous change for age without stenosis or irregularity of major arteries in the head and neck. Electronically Signed   By: Tiburcio Pea M.D.   On: 02/25/2023 04:30   MR BRAIN WO CONTRAST  Result Date: 02/25/2023 CLINICAL DATA:  Transient ischemic attack EXAM: MRI HEAD WITHOUT CONTRAST TECHNIQUE: Multiplanar, multiecho pulse sequences of the brain and surrounding structures were obtained without intravenous contrast. COMPARISON:  08/09/2003 FINDINGS: Brain: Small focus of acute ischemia within the left basal ganglia. Chronic microhemorrhage in the left basal ganglia. There is multifocal hyperintense T2-weighted signal within the white matter. Parenchymal volume and CSF spaces are normal. The midline structures are normal. Vascular: Major flow voids are preserved. Skull and upper cervical spine: Normal calvarium and skull base. Visualized upper cervical spine and soft tissues are normal. Sinuses/Orbits:No paranasal sinus fluid levels or advanced mucosal thickening. No mastoid or middle ear effusion. Normal orbits. IMPRESSION: 1. Small focus of acute ischemia within the left basal ganglia. No hemorrhage or mass effect. 2. Findings of chronic small vessel ischemia. Electronically Signed   By: Deatra Robinson M.D.   On: 02/25/2023 01:32   CT HEAD WO CONTRAST  Result Date: 02/24/2023 CLINICAL DATA:  Neuro deficit, acute, stroke suspected. EXAM: CT HEAD WITHOUT CONTRAST TECHNIQUE: Contiguous axial images were  obtained from the base of the skull through the vertex without intravenous contrast. RADIATION DOSE REDUCTION: This exam was performed according to the departmental dose-optimization program which includes automated exposure control, adjustment of the mA and/or kV according to patient size  and/or use of iterative reconstruction technique. COMPARISON:  MRI 08/15/2003 FINDINGS: Brain: No abnormality seen affecting the brainstem. Few old small vessel cerebellar infarctions. Cerebral hemispheres show mild chronic small-vessel ischemic change of the white matter. No cortical or large vessel territory infarction. No mass lesion, hemorrhage, hydrocephalus or extra-axial collection. Vascular: There is atherosclerotic calcification of the major vessels at the base of the brain. Skull: Negative Sinuses/Orbits: Clear/normal Other: None IMPRESSION: No acute CT finding. Mild chronic small-vessel ischemic changes of the cerebral hemispheric white matter. Few old small vessel cerebellar infarctions. Electronically Signed   By: Paulina Fusi M.D.   On: 02/24/2023 18:37    Pending Labs Unresulted Labs (From admission, onward)     Start     Ordered   03/04/23 0500  Creatinine, serum  (enoxaparin (LOVENOX)    CrCl >/= 30 ml/min)  Weekly,   R     Comments: while on enoxaparin therapy    02/25/23 0328   02/25/23 0500  CBC  (Labs)  Daily,   R      02/25/23 0328   02/25/23 0500  Basic metabolic panel  Daily,   R      02/25/23 0328   02/24/23 1718  Urine rapid drug screen (hosp performed)  Once,   STAT        02/24/23 1717   02/24/23 1718  Urinalysis, Routine w reflex microscopic -Urine, Clean Catch  Once,   URGENT       Question:  Specimen Source  Answer:  Urine, Clean Catch   02/24/23 1717            Vitals/Pain Today's Vitals   02/25/23 1002 02/25/23 1015 02/25/23 1030 02/25/23 1045  BP:      Pulse:      Resp:  16 20 15   Temp: 97.8 F (36.6 C)     TempSrc: Oral     SpO2:      Weight:      Height:      PainSc:        Isolation Precautions No active isolations  Medications Medications   stroke: early stages of recovery book (has no administration in time range)  acetaminophen (TYLENOL) tablet 650 mg (has no administration in time range)    Or  acetaminophen (TYLENOL) 160  MG/5ML solution 650 mg (has no administration in time range)    Or  acetaminophen (TYLENOL) suppository 650 mg (has no administration in time range)  enoxaparin (LOVENOX) injection 40 mg (40 mg Subcutaneous Given 02/25/23 1015)  0.9 % NaCl with KCl 20 mEq/ L  infusion ( Intravenous New Bag/Given 02/25/23 0359)  aspirin EC tablet 81 mg (81 mg Oral Given 02/25/23 1018)  clopidogrel (PLAVIX) tablet 75 mg (75 mg Oral Given 02/25/23 1017)  rosuvastatin (CRESTOR) tablet 20 mg (20 mg Oral Given 02/25/23 1018)  potassium chloride 10 mEq in 100 mL IVPB (0 mEq Intravenous Stopped 02/25/23 0017)  aspirin chewable tablet 324 mg (324 mg Oral Given 02/25/23 0205)  clopidogrel (PLAVIX) tablet 300 mg (300 mg Oral Given 02/25/23 0205)  iohexol (OMNIPAQUE) 350 MG/ML injection 75 mL (75 mLs Intravenous Contrast Given 02/25/23 0420)    Mobility walks     Focused Assessments Neuro Assessment Handoff:  Swallow screen pass? Yes  Cardiac Rhythm: Normal sinus rhythm NIH Stroke Scale  Dizziness Present: No Headache Present: No Interval: Shift assessment Level of Consciousness (1a.)   : Alert, keenly responsive LOC Questions (1b. )   : Answers both questions correctly LOC Commands (1c. )   : Performs both tasks correctly Best Gaze (2. )  : Normal Visual (3. )  : Partial hemianopia Facial Palsy (4. )    : Minor paralysis Motor Arm, Left (5a. )   : No drift Motor Arm, Right (5b. ) : No drift Motor Leg, Left (6a. )  : No drift Motor Leg, Right (6b. ) : No drift Limb Ataxia (7. ): Absent Sensory (8. )  : Mild-to-moderate sensory loss, patient feels pinprick is less sharp or is dull on the affected side, or there is a loss of superficial pain with pinprick, but patient is aware of being touched Best Language (9. )  : No aphasia Dysarthria (10. ): Mild-to-moderate dysarthria, patient slurs at least some words and, at worst, can be understood with some difficulty Extinction/Inattention (11.)   : No  Abnormality Complete NIHSS TOTAL: 4     Neuro Assessment: Exceptions to WDL Neuro Checks:   Initial (02/24/23 1719)  Has TPA been given? No If patient is a Neuro Trauma and patient is going to OR before floor call report to 4N Charge nurse: [phone] or [phone]   R Recommendations: See Admitting Provider Note  Report given to:   Additional Notes:

## 2023-02-25 NOTE — H&P (Signed)
History and Physical    [name]:[id number] DOB: May 23, 1956 DOA: 02/24/2023  PCP: Pincus Sanes, MD   Patient coming from: Home   Chief Complaint: Right facial droop, slurred speech, right facial numbness   HPI: [name] is a pleasant 67 y.o. female with medical history significant for hypertension, IBS, ADD, and recent COVID infection from which she has recovered who presents emergency department with right facial droop, slurred speech, and right facial numbness.  Patient reports that she was having a normal morning but sometime between 10:30 AM and noon, she was noted to have dysarthria, right facial droop, and felt that her right face was numb.  She has since developed numbness in the right foot as well.  She denies any weakness, difficulty swallowing, fever, or chills.  ED Course: Upon arrival to the ED, patient is found to be afebrile and saturating well on room air with normal heart rate and elevated blood pressure.  Labs are notable for potassium 3.0.  EKG demonstrates sinus rhythm with QTc 518.  MRI brain reveals small focus of acute ischemia within the left basal ganglia.  Neurology was consulted by the ED PA and the patient was treated with 324 milligrams aspirin, 300 mg of Plavix, and 30 mEq of IV potassium.  Review of Systems:  All other systems reviewed and apart from HPI, are negative.  Past Medical History:  Diagnosis Date   ADD    ALLERGIC RHINITIS    GERD    HOMOCYSTINEMIA    HYPERTENSION    IBS    Incidental lung nodule 02/2011 ct   unchanged 07/16/11 CT, 6 mo f/u  planned    Past Surgical History:  Procedure Laterality Date   ABDOMINAL HYSTERECTOMY     Bladder tack  2001    Social History:   reports that she has quit smoking. Her smoking use included cigarettes. She started smoking about 46 years ago. She has never used smokeless tobacco. She reports current alcohol use. She reports that she does not use drugs.  Allergies  Allergen  Reactions   Nifedipine     REACTION: increased HR   Shellfish Allergy     Family History  Problem Relation Age of Onset   Coronary artery disease Mother    Hypertension Mother    Heart failure Mother    Parkinson's disease Mother    Coronary artery disease Father    Hypertension Father    Diabetes Father    Stroke Father    Coronary artery disease Maternal Aunt    Stroke Maternal Aunt    Parkinson's disease Maternal Aunt    Stroke Maternal Uncle    Stroke Paternal Uncle    Hypertension Maternal Grandmother    Hypertension Maternal Grandfather    Colon cancer Maternal Grandfather    Hypertension Paternal Grandmother    Hypertension Paternal Grandfather    Colon cancer Paternal Grandfather    Colon cancer Cousin      Prior to Admission medications   Medication Sig Start Date End Date Taking? Authorizing Provider  amphetamine-dextroamphetamine (ADDERALL) 15 MG tablet Take 1 tablet by mouth 3 (three) times daily. 01/30/23   Pincus Sanes, MD  benzonatate (TESSALON) 100 MG capsule Take 1 capsule (100 mg total) by mouth 2 (two) times daily as needed for cough. 10/16/22   Roxy Horseman, PA-C  estradiol (ESTRACE) 1 MG tablet TAKE 1 TABLET BY MOUTH EVERY DAY 03/19/22   Pincus Sanes, MD  loratadine (CLARITIN) 10 MG  tablet Take 10 mg by mouth daily as needed.    [provider]  omeprazole (PRILOSEC) 20 MG capsule Take 20 mg by mouth daily.    [provider]  polyethylene glycol powder (GLYCOLAX/MIRALAX) 17 GM/SCOOP powder Take 17 g by mouth 2 (two) times daily as needed. 03/13/21   Pincus Sanes, MD  predniSONE (STERAPRED UNI-PAK 21 TAB) 10 MG (21) TBPK tablet Use as directed 08/24/22   Jannifer Rodney A, FNP  triamcinolone ointment (KENALOG) 0.5 % Apply 1 Application topically 2 (two) times daily. 08/24/22   Junie Spencer, FNP  triamterene-hydrochlorothiazide (MAXZIDE-25) 37.5-25 MG tablet Take 1 tablet by mouth daily. 03/19/22   Pincus Sanes, MD  valACYclovir  (VALTREX) 1000 MG tablet Take 2 tablets (2,000 mg total) by mouth 2 (two) times daily as needed. 03/07/20   Pincus Sanes, MD  verapamil (CALAN) 120 MG tablet TAKE 1 TABLET(120 MG) BY MOUTH THREE TIMES DAILY 10/21/21   Pincus Sanes, MD    Physical Exam: Vitals:   02/25/23 0000 02/25/23 0030 02/25/23 0200 02/25/23 0307  BP: (!) 152/98 (!) 168/109 (!) 169/103   Pulse: 68 74 69   Resp: 15 17 15    Temp:    98 F (36.7 C)  TempSrc:      SpO2: 98% 100% 100%     Constitutional: NAD, calm  Eyes: PERTLA, lids and conjunctivae normal ENMT: Mucous membranes are moist. Posterior pharynx clear of any exudate or lesions.   Neck: supple, no masses  Respiratory: no wheezing, no crackles. No accessory muscle use.  Cardiovascular: S1 & S2 heard, regular rate and rhythm. No JVD. Abdomen: No distension, no tenderness, soft. Bowel sounds active.  Musculoskeletal: no clubbing / cyanosis. No joint deformity upper and lower extremities.   Skin: no significant rashes, lesions, ulcers. Warm, dry, well-perfused. Neurologic: right lower facial weakness and mild dysarthria, CN 2-12 grossly intact otherwise. Sensation to light touch impaired in right face and right foot. Strength 5/5 in all 4 limbs. Alert and oriented.  Psychiatric: Pleasant. Cooperative.    Labs and Imaging on Admission: I have personally reviewed following labs and imaging studies  CBC: Recent Labs  Lab 02/24/23 1719  WBC 5.0  NEUTROABS 3.0  HGB 13.7  HCT 39.2  MCV 91.2  PLT 293   Basic Metabolic Panel: Recent Labs  Lab 02/24/23 1719  NA 137  K 3.0*  CL 100  CO2 26  GLUCOSE 107*  BUN 17  CREATININE 0.83  CALCIUM 9.2   GFR: CrCl cannot be calculated (Unknown ideal weight.). Liver Function Tests: Recent Labs  Lab 02/24/23 1719  AST 27  ALT 28  ALKPHOS 76  BILITOT 0.3  PROT 7.4  ALBUMIN 4.1   No results for input(s): "LIPASE", "AMYLASE" in the last 168 hours. No results for input(s): "AMMONIA" in the last 168  hours. Coagulation Profile: Recent Labs  Lab 02/24/23 1719  INR 0.9   Cardiac Enzymes: No results for input(s): "CKTOTAL", "CKMB", "CKMBINDEX", "TROPONINI" in the last 168 hours. BNP (last 3 results) No results for input(s): "PROBNP" in the last 8760 hours. HbA1C: No results for input(s): "HGBA1C" in the last 72 hours. CBG: Recent Labs  Lab 02/24/23 1718  GLUCAP 105*   Lipid Profile: No results for input(s): "CHOL", "HDL", "LDLCALC", "TRIG", "CHOLHDL", "LDLDIRECT" in the last 72 hours. Thyroid Function Tests: No results for input(s): "TSH", "T4TOTAL", "FREET4", "T3FREE", "THYROIDAB" in the last 72 hours. Anemia Panel: No results for input(s): "VITAMINB12", "FOLATE", "FERRITIN", "TIBC", "  IRON", "RETICCTPCT" in the last 72 hours. Urine analysis:    Component Value Date/Time   COLORURINE LT. YELLOW 03/18/2013 0957   APPEARANCEUR CLEAR 03/18/2013 0957   LABSPEC 1.020 03/18/2013 0957   PHURINE 7.5 03/18/2013 0957   GLUCOSEU NEGATIVE 03/18/2013 0957   HGBUR NEGATIVE 03/18/2013 0957   HGBUR negative 05/09/2009 1409   BILIRUBINUR NEGATIVE 03/18/2013 0957   KETONESUR NEGATIVE 03/18/2013 0957   PROTEINUR NEGATIVE 04/24/2012 1747   UROBILINOGEN 0.2 03/18/2013 0957   NITRITE NEGATIVE 03/18/2013 0957   LEUKOCYTESUR NEGATIVE 03/18/2013 0957   Sepsis Labs: @LABRCNTIP (procalcitonin:4,lacticidven:4) )No results found for this or any previous visit (from the past 240 hour(s)).   Radiological Exams on Admission: MR BRAIN WO CONTRAST  Result Date: 02/25/2023 CLINICAL DATA:  Transient ischemic attack EXAM: MRI HEAD WITHOUT CONTRAST TECHNIQUE: Multiplanar, multiecho pulse sequences of the brain and surrounding structures were obtained without intravenous contrast. COMPARISON:  08/09/2003 FINDINGS: Brain: Small focus of acute ischemia within the left basal ganglia. Chronic microhemorrhage in the left basal ganglia. There is multifocal hyperintense T2-weighted signal within the white matter.  Parenchymal volume and CSF spaces are normal. The midline structures are normal. Vascular: Major flow voids are preserved. Skull and upper cervical spine: Normal calvarium and skull base. Visualized upper cervical spine and soft tissues are normal. Sinuses/Orbits:No paranasal sinus fluid levels or advanced mucosal thickening. No mastoid or middle ear effusion. Normal orbits. IMPRESSION: 1. Small focus of acute ischemia within the left basal ganglia. No hemorrhage or mass effect. 2. Findings of chronic small vessel ischemia. Electronically Signed   By: Deatra Robinson M.D.   On: 02/25/2023 01:32   CT HEAD WO CONTRAST  Result Date: 02/24/2023 CLINICAL DATA:  Neuro deficit, acute, stroke suspected. EXAM: CT HEAD WITHOUT CONTRAST TECHNIQUE: Contiguous axial images were obtained from the base of the skull through the vertex without intravenous contrast. RADIATION DOSE REDUCTION: This exam was performed according to the departmental dose-optimization program which includes automated exposure control, adjustment of the mA and/or kV according to patient size and/or use of iterative reconstruction technique. COMPARISON:  MRI 08/15/2003 FINDINGS: Brain: No abnormality seen affecting the brainstem. Few old small vessel cerebellar infarctions. Cerebral hemispheres show mild chronic small-vessel ischemic change of the white matter. No cortical or large vessel territory infarction. No mass lesion, hemorrhage, hydrocephalus or extra-axial collection. Vascular: There is atherosclerotic calcification of the major vessels at the base of the brain. Skull: Negative Sinuses/Orbits: Clear/normal Other: None IMPRESSION: No acute CT finding. Mild chronic small-vessel ischemic changes of the cerebral hemispheric white matter. Few old small vessel cerebellar infarctions. Electronically Signed   By: Paulina Fusi M.D.   On: 02/24/2023 18:37    EKG: Independently reviewed. Sinus rhythm, QTc 518 ms.   Assessment/Plan   1. Acute ischemic  CVA  - Continue frequent neuro checks and cardiac monitoring, check CTA head & neck, echo, lipids, and A1c, consult PT/OT/SLP, and follow-up on neurology recommendations    2. Hypokalemia  - Replacing, will repeat chem panel   3. Prolonged QT interval  - QTc is 518 ms on admission  - Correct hypokalemia, check magnesium level, avoid QT-prolonging medications    4. Hypertension  - Permit hypertension in acute phase of ischemic CVA    DVT prophylaxis: Lovenox  Code Status: Full  Level of Care: Level of care: Telemetry Medical Family Communication: none present  Disposition Plan:  Patient is from: Home  Anticipated d/c is to: TBD Anticipated d/c date is: 02/26/23  Patient currently: Pending CVA  workup, therapy assessments  Consults called: neurology  Admission status: Observation     Briscoe Deutscher, MD [facility]  02/25/2023, 3:29 AM

## 2023-02-25 NOTE — Progress Notes (Signed)
[employer] Stroke RESEARCH STUDY NOTE   Patient is participating in  [employer]  stroke prevention study ( standard of care antiplatelet therapy plus Asundexian-factor 11 inhibitor versus standard of care antiplatelet therapy plus placebo ).  Patient was given study material to review and informed consent form at 11:50 AM.  Patient was advised to take as much time as she wanted to review the consent form and encouraged to ask questions which were answered.  Patient's business partner was  also present at the bedside and were also encouraged to review the material and ask questions.  It was made clear to the patient that study participation is voluntary and patient will still get the same excellent standard of care irrespective of whether she chooses to participate in the study or not.  The benefits of participation in the study as well as the risk involved including but not limited to bleeding as well as alternatives to not participating in the study were clearly discussed with the patient and family who expressed understanding.  The patient was clearly informed that patient has no obligation to's stay in the study for the entire duration and is free to discontinue study medication or study participation if she is dissatisfied at any time in the future.  The research study office visit scheduled was explained to the patient and study obligations were clearly explained as well.  Patient voiced understanding and willingness to participate in the study.  The study inclusion exclusion criteria reviewed by me personally as well as study coordinator and verified that patient met all criteria.  Patient signed informed consent  in my presence and 3 PM.  The hospital research pharmacy was notified in advance about potential patient participation and after patient randomization and patient received first dose of medication before discharge and she was discharged home with the study medication bottle and instructed to  call [facility] research office with any questions.  No study specific procedure was done prior to patient signing informed consent form.  A copy of informed consent form and patient Annette Stable of Rights signed by the patient was given to her.  Delia Heady, MD

## 2023-02-25 NOTE — Evaluation (Signed)
Speech Language Pathology Evaluation Patient Details Name: [name] MRN: [id number] DOB: Jun 15, 1956 Today's Date: 02/25/2023 Time: 1422-1440 SLP Time Calculation (min) (ACUTE ONLY): 18 min  Problem List:  Patient Active Problem List   Diagnosis Date Noted   Acute ischemic stroke (HCC) 02/25/2023   Hypokalemia 02/25/2023   Prolonged Q-T interval on ECG 02/25/2023   Bilateral upper abdominal discomfort 03/13/2021   Hormone replacement therapy (HRT) 02/09/2018   Incidental lung nodule RUL stable 2012-2016 - no f/u needed 01/08/2012   Eczema 01/08/2012   Attention deficit disorder 04/14/2007   Essential hypertension 12/09/2006   ALLERGIC RHINITIS 12/09/2006   IBS 12/09/2006   Past Medical History:  Past Medical History:  Diagnosis Date   ADD    ALLERGIC RHINITIS    GERD    HOMOCYSTINEMIA    HYPERTENSION    IBS    Incidental lung nodule 02/2011 ct   unchanged 07/16/11 CT, 6 mo f/u  planned   Past Surgical History:  Past Surgical History:  Procedure Laterality Date   ABDOMINAL HYSTERECTOMY     Bladder tack  2001   HPI:  [name] is a 67 yo female presenting to ED 7/17 with R facial droop, slurred speech, R facial numbness. MRI Brain revealed small focus of acute ischemia within L basal ganglia as well as findings of chronic small vessel ischemia. PMH includes HTN, IBS, ADD, recent COVID infection   Assessment / Plan / Recommendation Clinical Impression  Pt reports living alone and handling all finances and medications independently. 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 She presents with a R facial droop which likley contributes to mild dysarthria, although intelligibility is only slightly reduced at the conversation level. Pt reports no acute difficulties with memory, only exhaustion from being awake 24+ hours. She scored a 23/30 on the SLUMS characterized by difficulty with sustained attention, recall, and problem solving. Both pt and visitor  subjectively report that she is at her baseline. SLP provided education regarding increased assitance after discharge with more complex cognitive tasks and option for OP ST f/u if pt notes any acute difficulties. Recommend OP SLP f/u for dysarthria and cognition PRN. Will s/o acutely.    SLP Assessment  SLP Recommendation/Assessment: All further Speech Lanaguage Pathology  needs can be addressed in the next venue of care SLP Visit Diagnosis: Cognitive communication deficit (R41.841)    Recommendations for follow up therapy are one component of a multi-disciplinary discharge planning process, led by the attending physician.  Recommendations may be updated based on patient status, additional functional criteria and insurance authorization.    Follow Up Recommendations  Outpatient SLP    Assistance Recommended at Discharge  PRN  Functional Status Assessment Patient has had a recent decline in their functional status and demonstrates the ability to make significant improvements in function in a reasonable and predictable amount of time.  Frequency and Duration           SLP Evaluation Cognition  Overall Cognitive Status: Impaired/Different from baseline Arousal/Alertness: Awake/alert Orientation Level: Oriented X4 Attention: Sustained Sustained Attention: Impaired Sustained Attention Impairment: Verbal basic;Functional basic Memory: Appears intact Awareness: Appears intact Problem Solving: Impaired Problem Solving Impairment: Verbal basic Safety/Judgment: Appears intact       Comprehension  Auditory Comprehension Overall Auditory Comprehension: Appears within functional limits for tasks assessed Visual Recognition/Discrimination Discrimination: Not tested Reading Comprehension Reading Status: Not tested    Expression Expression Primary Mode of Expression: Verbal Verbal Expression Overall Verbal Expression: Appears within functional  limits for tasks assessed Written  Expression Dominant Hand: Right   Oral / Motor  Oral Motor/Sensory Function Overall Oral Motor/Sensory Function: Moderate impairment Facial ROM: Reduced right Facial Symmetry: Abnormal symmetry right Facial Strength: Reduced right Facial Sensation: Reduced right Motor Speech Overall Motor Speech: Impaired Respiration: Within functional limits Phonation: Normal Resonance: Within functional limits Articulation: Impaired Level of Impairment: Conversation Intelligibility: Intelligibility reduced Conversation: 75-100% accurate Motor Planning: Witnin functional limits            Gwynneth Aliment, M.A., CF-SLP Speech Language Pathology, Acute Rehabilitation Services  Secure Chat preferred [phone]  02/25/2023, 2:55 PM

## 2023-02-25 NOTE — Evaluation (Signed)
Occupational Therapy Evaluation Patient Details Name: [name] MRN: [id number] DOB: 12/21/1955 Today's Date: 02/25/2023   History of Present Illness Patient is 67 y.o. female presenting with Rt facial droop and dysarthria. MRI revealed small focus of acute ischemia within the left basal ganglia; no hemorrhage or mass effect. PMH significant for hypertension on verapamil, ADD on stimulants, hormone replacement therapy, irritable bowel syndrome, Raynaud's.   Clinical Impression   Pt evaluated s/p above admission list. Pt reports independence with ADL/IADLs, functional mobility, driving and working in [employer] representative. Pt presents this session with decreased R grip strength, mild RUE incoordination with finger to nose, reciprocal finger to thumb and dysdiachokinesia. Pt with intact RUE sensation. Pt currently completes UB and LB ADLs with generalized supervision A for safety during eval. Pt completed functional transfers and mobility without AD with generalized supervision A for safety. Pt would benefit from continued acute OT services to maximize functional independence and facilitate transition to pt's natural home environment with outpatient neuro OT services upon discharge to further assess RUE.      Recommendations for follow up therapy are one component of a multi-disciplinary discharge planning process, led by the attending physician.  Recommendations may be updated based on patient status, additional functional criteria and insurance authorization.   Assistance Recommended at Discharge PRN  Patient can return home with the following Assistance with cooking/housework    Functional Status Assessment  Patient has had a recent decline in their functional status and demonstrates the ability to make significant improvements in function in a reasonable and predictable amount of time.  Equipment Recommendations  None recommended by OT    Recommendations for Other Services       Precautions  / Restrictions Precautions Precautions: Fall Restrictions Weight Bearing Restrictions: No      Mobility Bed Mobility Overal bed mobility: Modified Independent             General bed mobility comments: HOB elevated    Transfers Overall transfer level: Needs assistance Equipment used: None Transfers: Sit to/from Stand Sit to Stand: Supervision           General transfer comment: STS transfer and room level mobility with generalized supervision A for safety      Balance Overall balance assessment: Needs assistance Sitting-balance support: No upper extremity supported, Feet unsupported Sitting balance-Leahy Scale: Good     Standing balance support: During functional activity, No upper extremity supported Standing balance-Leahy Scale: Good                             ADL either performed or assessed with clinical judgement   ADL Overall ADL's : Independent                                       General ADL Comments: Pt completed full body bathing/dressing while standing with generalized supervision A for safety during eval     Vision Baseline Vision/History: 1 Wears glasses Ability to See in Adequate Light: 0 Adequate Patient Visual Report: No change from baseline Vision Assessment?: No apparent visual deficits     Perception Perception Perception Tested?: No   Praxis Praxis Praxis tested?: Not tested    Pertinent Vitals/Pain Pain Assessment Pain Assessment: No/denies pain Pain Intervention(s): Monitored during session     Hand Dominance Right   Extremity/Trunk Assessment Upper Extremity Assessment Upper Extremity Assessment:  RUE deficits/detail;LUE deficits/detail RUE Deficits / Details: decreased grip strength, otherwise 5/5 strength. Mild incoordination with finger to nose, reciprocal finger to thumb and dysdiochokinesia. RUE Sensation: WNL RUE Coordination: decreased fine motor;decreased gross motor LUE Deficits /  Details: WFL   Lower Extremity Assessment Lower Extremity Assessment: Defer to PT evaluation   Cervical / Trunk Assessment Cervical / Trunk Assessment: Normal   Communication Communication Communication: Expressive difficulties (slurred speech)   Cognition Arousal/Alertness: Awake/alert Behavior During Therapy: WFL for tasks assessed/performed Overall Cognitive Status: Within Functional Limits for tasks assessed                                 General Comments: Pleasant and cooperative, follows commands appropriately     General Comments  VSS on RA    Exercises     Shoulder Instructions      Home Living Family/patient expects to be discharged to:: Private residence Living Arrangements: Alone Available Help at Discharge: Family;Neighbor;Friend(s) Type of Home: House Home Access: Level entry Entrance Stairs-Number of Steps: small threshold Entrance Stairs-Rails: None Home Layout: One level     Bathroom Shower/Tub: Chief Strategy Officer: Handicapped height Bathroom Accessibility: Yes   Home Equipment: None   Additional Comments: has access to her mothers DME (walker and cane) in attic  Lives With: Alone    Prior Functioning/Environment Prior Level of Function : Independent/Modified Independent;Working/employed;Driving             Mobility Comments: indep without use of AD ADLs Comments: Indep with ADL/IADLs, driving and works in Garment/textile technologist Problem List: Decreased strength;Impaired balance (sitting and/or standing);Decreased coordination;Impaired UE functional use      OT Treatment/Interventions: Self-care/ADL training;Therapeutic exercise;Neuromuscular education;Energy conservation;DME and/or AE instruction;Therapeutic activities;Patient/family education;Balance training    OT Goals(Current goals can be found in the care plan section) Acute Rehab OT Goals Patient Stated Goal: to go home OT Goal Formulation: With  patient Time For Goal Achievement: 03/11/23 Potential to Achieve Goals: Good ADL Goals Additional ADL Goal #1: Pt will complete all ADLs with mod I Additional ADL Goal #2: Pt will independently complete RUE fine/gross motor HEP to maximize independence with ADL/IADLs  OT Frequency: Min 2X/week    Co-evaluation              AM-PAC OT "6 Clicks" Daily Activity     Outcome Measure Help from another person eating meals?: None Help from another person taking care of personal grooming?: None Help from another person toileting, which includes using toliet, bedpan, or urinal?: None Help from another person bathing (including washing, rinsing, drying)?: None Help from another person to put on and taking off regular upper body clothing?: None Help from another person to put on and taking off regular lower body clothing?: None 6 Click Score: 24   End of Session Equipment Utilized During Treatment: Other (comment) (none) Nurse Communication: Mobility status  Activity Tolerance: Patient tolerated treatment well Patient left: in bed;with call bell/phone within reach  OT Visit Diagnosis: Muscle weakness (generalized) (M62.81)                Time: 6063-0160 OT Time Calculation (min): 21 min Charges:  OT General Charges $OT Visit: 1 Visit OT Evaluation $OT Eval Moderate Complexity: 1 Mod  Sherley Bounds, OTS Acute Rehabilitation Services Office [phone] Secure Chat Communication Preferred   Sherley Bounds 02/25/2023, 5:10 PM

## 2023-02-25 NOTE — Progress Notes (Signed)
  Echocardiogram 2D Echocardiogram has been performed.  [name] 02/25/2023, 11:39 AM

## 2023-02-25 NOTE — Discharge Summary (Addendum)
Physician Discharge Summary   Patient: [name] MRN: [id number] DOB: Jan 06, 1956  Admit date:     02/24/2023  Discharge date: 02/25/23  Discharge Physician: Tyrone Nine   PCP: Pincus Sanes, MD   Recommendations at discharge:  Continue regular PCP follow up.  Follow up with neurology after discharge. Has enrolled in study (see details per Dr. Pearlean Brownie).  Discharge Diagnoses: Principal Problem:   Acute ischemic stroke [facility]) Active Problems:   Essential hypertension   Hypokalemia   Prolonged Q-T interval on ECG  Hospital Course: ARMENIA SILVERIA is a 67 y.o. RHD female with a history of HTN, IBS, ADD, recent covid-19 infection who presented to the ED on 02/24/2023 with right facial droop, slurred speech followed by weakness/numbness in the right hand and foot. MRI brain confirmed small left basal ganglia ischemic CVA for which she was admitted this morning. Work up has been completed and neurology recommends discharge on DAPT and the patient has been enrolled in study.  Assessment and Plan: Acute ischemic left BG CVA causing facial droop, right hand and foot sensation changes. CTA without LVO. Echo without CES.   - Per neurology: DAPT x3 weeks followed by aspirin alone - Enrolled in study, has received study medication bottle prior to discharge (OCEANIC) - Start rosuvastatin, LDL 110.  - DC estradiol - Follow up with PCP for HTN management - Change omeprazole to pantoprazole while on plavix.  Consultants: Neurology Procedures performed: Echo  Disposition: Home Diet recommendation:  Cardiac diet DISCHARGE MEDICATION: Allergies as of 02/25/2023       Reactions   Nifedipine Other (See Comments)   Increased HR   Shellfish Allergy Other (See Comments)   One side of face goes paralyzed         Medication List     STOP taking these medications    estradiol 1 MG tablet Commonly known as: ESTRACE   ibuprofen 200 MG tablet Commonly known as: ADVIL   omeprazole 20 MG  capsule Commonly known as: PRILOSEC   verapamil 120 MG tablet Commonly known as: CALAN       TAKE these medications    amphetamine-dextroamphetamine 15 MG tablet Commonly known as: Adderall Take 1 tablet by mouth 3 (three) times daily. What changed:  when to take this reasons to take this   aspirin EC 81 MG tablet Take 1 tablet (81 mg total) by mouth daily for 90 days then as directed by MD. Swallow whole. Start taking on: February 26, 2023   clopidogrel 75 MG tablet Commonly known as: PLAVIX Take 1 tablet (75 mg total) by mouth daily. Start taking on: February 26, 2023   lidocaine 4 % Place 1 patch onto the skin as needed (pain).   loratadine 10 MG tablet Commonly known as: CLARITIN Take 10 mg by mouth daily.   OCEANIC-STROKE asundexian or placebo 50 mg tablet Take 1 tablet (50 mg total) by mouth daily.   pantoprazole 40 MG tablet Commonly known as: Protonix Take 1 tablet (40 mg total) by mouth daily.   polyethylene glycol powder 17 GM/SCOOP powder Commonly known as: GLYCOLAX/MIRALAX Take 17 g by mouth 2 (two) times daily as needed. What changed:  when to take this reasons to take this   rosuvastatin 20 MG tablet Commonly known as: CRESTOR Take 1 tablet (20 mg total) by mouth daily. Start taking on: February 26, 2023   triamterene-hydrochlorothiazide 37.5-25 MG tablet Commonly known as: MAXZIDE-25 Take 1 tablet by mouth daily.   valACYclovir 1000  MG tablet Commonly known as: VALTREX Take 2 tablets (2,000 mg total) by mouth 2 (two) times daily as needed. What changed:  how much to take when to take this reasons to take this        Follow-up Information     Burns, Bobette Mo, MD Follow up.   Specialty: Internal Medicine Contact information: [address] [place] [address] [phone]         Micki Riley, MD Follow up in 1 month(s).   Specialties: Neurology, Radiology Contact information: [address] [place]  [address] [phone]         [facility]. Schedule an appointment as soon as possible for a visit.   Specialty: Rehabilitation Why: Please call and make an appointment to begin your outpatient therapy Contact information: [address] [place] [address] [phone]               Discharge Exam: BP (!) 170/96   Pulse 75   Temp 97.8 F (36.6 C) (Oral)   Resp 19   Ht 5\' 6"  (1.676 m)   Wt 72.6 kg   SpO2 99%   BMI 25.82 kg/m   Gen: No distress Neuro: Alert and oriented. R lower facial droop affecting speech mildly, no significant weakness noted. +abnormal sensation.   Condition at discharge: good  The results of significant diagnostics from this hospitalization (including imaging, microbiology, ancillary and laboratory) are listed below for reference.   Imaging Studies: ECHOCARDIOGRAM COMPLETE  Result Date: 02/25/2023    ECHOCARDIOGRAM REPORT   Patient Name:   [name] Date of Exam: 02/25/2023 Medical Rec #:  [id number]       Height:       66.0 in Accession #:    [id number]      Weight:       160.0 lb Date of Birth:  1955/12/01       BSA:          1.819 m Patient Age:    67 years        BP:           184/102 mmHg Patient Gender: F               HR:           78 bpm. Exam Location:  Inpatient Procedure: 2D Echo, Color Doppler and Cardiac Doppler Indications:    Stroke  History:        Patient has no prior history of Echocardiogram examinations.                 Risk Factors:Hypertension, Former Smoker and Family History of                 Coronary Artery Disease.  Sonographer:    Milda Smart Referring Phys: [id number] TIMOTHY S OPYD  Sonographer Comments: Image acquisition challenging due to respiratory motion. IMPRESSIONS  1. Left ventricular ejection fraction, by estimation, is 60 to 65%. The left ventricle has normal function. The left ventricle has no regional wall motion abnormalities. There is mild concentric left  ventricular hypertrophy. Left ventricular diastolic parameters were normal.  2. Right ventricular systolic function is normal. The right ventricular size is normal.  3. The mitral valve is normal in structure. No evidence of mitral valve regurgitation. No evidence of mitral stenosis.  4. The aortic valve is tricuspid. Aortic valve regurgitation is not visualized. No aortic stenosis is present.  5. Aortic dilatation noted. There is borderline dilatation of the ascending aorta, measuring 38 mm.  6. The inferior vena cava is normal in size with greater than 50% respiratory variability, suggesting right atrial pressure of 3 mmHg. FINDINGS  Left Ventricle: Left ventricular ejection fraction, by estimation, is 60 to 65%. The left ventricle has normal function. The left ventricle has no regional wall motion abnormalities. The left ventricular internal cavity size was normal in size. There is  mild concentric left ventricular hypertrophy. Left ventricular diastolic parameters were normal. Right Ventricle: The right ventricular size is normal. No increase in right ventricular wall thickness. Right ventricular systolic function is normal. Left Atrium: Left atrial size was normal in size. Right Atrium: Right atrial size was normal in size. Pericardium: There is no evidence of pericardial effusion. Mitral Valve: The mitral valve is normal in structure. No evidence of mitral valve regurgitation. No evidence of mitral valve stenosis. MV peak gradient, 5.2 mmHg. The mean mitral valve gradient is 3.0 mmHg. Tricuspid Valve: The tricuspid valve is normal in structure. Tricuspid valve regurgitation is not demonstrated. No evidence of tricuspid stenosis. Aortic Valve: The aortic valve is tricuspid. Aortic valve regurgitation is not visualized. No aortic stenosis is present. Pulmonic Valve: The pulmonic valve was normal in structure. Pulmonic valve regurgitation is not visualized. No evidence of pulmonic stenosis. Aorta: Aortic  dilatation noted and the aortic root is normal in size and structure. There is borderline dilatation of the ascending aorta, measuring 38 mm. Venous: The inferior vena cava is normal in size with greater than 50% respiratory variability, suggesting right atrial pressure of 3 mmHg. IAS/Shunts: No atrial level shunt detected by color flow Doppler.  LEFT VENTRICLE PLAX 2D LVIDd:         3.60 cm     Diastology LVIDs:         2.50 cm     LV e' medial:    7.07 cm/s LV PW:         1.20 cm     LV E/e' medial:  12.6 LV IVS:        1.20 cm     LV e' lateral:   9.14 cm/s LVOT diam:     2.10 cm     LV E/e' lateral: 9.8 LV SV:         88 LV SV Index:   49 LVOT Area:     3.46 cm  LV Volumes (MOD) LV vol d, MOD A2C: 45.7 ml LV vol d, MOD A4C: 91.0 ml LV vol s, MOD A2C: 20.5 ml LV vol s, MOD A4C: 30.3 ml LV SV MOD A2C:     25.2 ml LV SV MOD A4C:     91.0 ml LV SV MOD BP:      44.2 ml RIGHT VENTRICLE             IVC RV Basal diam:  3.70 cm     IVC diam: 1.30 cm RV Mid diam:    2.00 cm RV S prime:     12.10 cm/s LEFT ATRIUM             Index        RIGHT ATRIUM           Index LA diam:        3.70 cm 2.03 cm/m   RA Area:     14.50 cm LA Vol (A2C):   45.7 ml 25.13 ml/m  RA Volume:   37.70 ml  20.73 ml/m LA Vol (A4C):   37.2 ml 20.48 ml/m LA Biplane Vol: 47.7 ml 26.23 ml/m  AORTIC VALVE LVOT Vmax:   119.00 cm/s LVOT Vmean:  88.500 cm/s LVOT VTI:    0.255 m  AORTA Ao Root diam: 3.30 cm Ao Asc diam:  3.80 cm MITRAL VALVE               TRICUSPID VALVE MV Area (PHT): 2.68 cm    TR Peak grad:   33.2 mmHg MV Area VTI:   2.97 cm    TR Mean grad:   22.0 mmHg MV Peak grad:  5.2 mmHg    TR Vmax:        288.00 cm/s MV Mean grad:  3.0 mmHg    TR Vmean:       219.0 cm/s MV Vmax:       1.14 m/s MV Vmean:      83.9 cm/s   SHUNTS MV Decel Time: 283 msec    Systemic VTI:  0.26 m MR Peak grad: 98.0 mmHg    Systemic Diam: 2.10 cm MR Vmax:      495.00 cm/s MV E velocity: 89.20 cm/s MV A velocity: 99.40 cm/s MV E/A ratio:  0.90 Arvilla Meres MD  Electronically signed by Arvilla Meres MD Signature Date/Time: 02/25/2023/11:53:44 AM    Final    CT ANGIO HEAD NECK W WO CM  Result Date: 02/25/2023 CLINICAL DATA:  Stroke, determine embolic source. Right facial droop and slurred speech with right facial numbness. EXAM: CT ANGIOGRAPHY HEAD AND NECK WITH AND WITHOUT CONTRAST TECHNIQUE: Multidetector CT imaging of the head and neck was performed using the standard protocol during bolus administration of intravenous contrast. Multiplanar CT image reconstructions and MIPs were obtained to evaluate the vascular anatomy. Carotid stenosis measurements (when applicable) are obtained utilizing NASCET criteria, using the distal internal carotid diameter as the denominator. RADIATION DOSE REDUCTION: This exam was performed according to the departmental dose-optimization program which includes automated exposure control, adjustment of the mA and/or kV according to patient size and/or use of iterative reconstruction technique. CONTRAST:  75mL OMNIPAQUE IOHEXOL 350 MG/ML SOLN COMPARISON:  Brain MRI from earlier the same day FINDINGS: CT HEAD FINDINGS Brain: The patient's acute lacunar infarct is currently occult by head CT. No hemorrhage, hydrocephalus, or masslike finding. Chronic small vessel ischemic gliosis in the cerebral white matter. Vascular: See below Skull: Negative Sinuses/Orbits: Negative Review of the MIP images confirms the above findings CTA NECK FINDINGS Aortic arch: Negative Right carotid system: Limited atheromatous plaque at the bifurcation. No stenosis, ulceration, or dissection. Left carotid system: No stenosis, ulceration, or dissection. Vertebral arteries: Notable tortuosity of the vertebral arteries. No subclavian or vertebral stenosis or ulceration. Skeleton: Generalized cervical spine degeneration. Mild C4-5 anterolisthesis Other neck: Negative Upper chest: Clear apical lungs Review of the MIP images confirms the above findings CTA HEAD FINDINGS  Anterior circulation: No significant stenosis, proximal occlusion, aneurysm, or vascular malformation. Posterior circulation: No significant stenosis, proximal occlusion, aneurysm, or vascular malformation. Venous sinuses: As permitted by contrast timing, patent. Review of the MIP images confirms the above findings IMPRESSION: No emergent finding. Limited atheromatous change for age without stenosis or irregularity of major arteries in the head and neck. Electronically Signed   By: Tiburcio Pea M.D.   On: 02/25/2023 04:30   MR BRAIN WO CONTRAST  Result Date: 02/25/2023 CLINICAL DATA:  Transient ischemic attack EXAM: MRI HEAD WITHOUT CONTRAST TECHNIQUE: Multiplanar, multiecho pulse sequences of the brain and surrounding  structures were obtained without intravenous contrast. COMPARISON:  08/09/2003 FINDINGS: Brain: Small focus of acute ischemia within the left basal ganglia. Chronic microhemorrhage in the left basal ganglia. There is multifocal hyperintense T2-weighted signal within the white matter. Parenchymal volume and CSF spaces are normal. The midline structures are normal. Vascular: Major flow voids are preserved. Skull and upper cervical spine: Normal calvarium and skull base. Visualized upper cervical spine and soft tissues are normal. Sinuses/Orbits:No paranasal sinus fluid levels or advanced mucosal thickening. No mastoid or middle ear effusion. Normal orbits. IMPRESSION: 1. Small focus of acute ischemia within the left basal ganglia. No hemorrhage or mass effect. 2. Findings of chronic small vessel ischemia. Electronically Signed   By: Deatra Robinson M.D.   On: 02/25/2023 01:32   CT HEAD WO CONTRAST  Result Date: 02/24/2023 CLINICAL DATA:  Neuro deficit, acute, stroke suspected. EXAM: CT HEAD WITHOUT CONTRAST TECHNIQUE: Contiguous axial images were obtained from the base of the skull through the vertex without intravenous contrast. RADIATION DOSE REDUCTION: This exam was performed according to  the departmental dose-optimization program which includes automated exposure control, adjustment of the mA and/or kV according to patient size and/or use of iterative reconstruction technique. COMPARISON:  MRI 08/15/2003 FINDINGS: Brain: No abnormality seen affecting the brainstem. Few old small vessel cerebellar infarctions. Cerebral hemispheres show mild chronic small-vessel ischemic change of the white matter. No cortical or large vessel territory infarction. No mass lesion, hemorrhage, hydrocephalus or extra-axial collection. Vascular: There is atherosclerotic calcification of the major vessels at the base of the brain. Skull: Negative Sinuses/Orbits: Clear/normal Other: None IMPRESSION: No acute CT finding. Mild chronic small-vessel ischemic changes of the cerebral hemispheric white matter. Few old small vessel cerebellar infarctions. Electronically Signed   By: Paulina Fusi M.D.   On: 02/24/2023 18:37    Microbiology: No results found for this or any previous visit.  Labs: CBC: Recent Labs  Lab 02/24/23 1719 02/25/23 0350  WBC 5.0 5.1  NEUTROABS 3.0  --   HGB 13.7 12.5  HCT 39.2 36.7  MCV 91.2 94.3  PLT 293 253   Basic Metabolic Panel: Recent Labs  Lab 02/24/23 1719 02/25/23 0508  NA 137 136  K 3.0* 3.4*  CL 100 103  CO2 26 24  GLUCOSE 107* 93  BUN 17 13  CREATININE 0.83 0.73  CALCIUM 9.2 8.4*  MG  --  1.7   Liver Function Tests: Recent Labs  Lab 02/24/23 1719 02/25/23 0508  AST 27 25  ALT 28 25  ALKPHOS 76 65  BILITOT 0.3 0.4  PROT 7.4 5.8*  ALBUMIN 4.1 3.2*   CBG: Recent Labs  Lab 02/24/23 1718  GLUCAP 105*    Discharge time spent: greater than 30 minutes.  Signed: Tyrone Nine, MD [facility] 02/25/2023

## 2023-02-25 NOTE — TOC CM/SW Note (Addendum)
Transition of Care [facility]) - Inpatient Brief Assessment   Patient Details  Name: CARSYN BOSTER MRN: [id number] Date of Birth: [birth year]  [facility] CM/SW Contact:    Gordy Clement, RN Phone Number: 02/25/2023, 3:33 PM    Clinical Narrative:  Patient from home alone although she has a lot of family support .  She does not show any insurance in [place]. She does have a PCP established  OP PT/OT and SLP have been recommended and referral is made     Transition of Care Asessment: Insurance and Status: Insurance coverage has been reviewed (UNINSURED) Patient has primary care physician: Yes (Lawerance Bach, Bobette Mo, MD) Home environment has been reviewed: FROM HOME ALONE- LOTS OF FAMILY SUPPORT THOUGH Prior level of function:: INDEPENDENT Prior/Current Home Services: No current home services Social Determinants of Health Reivew: SDOH reviewed no interventions necessary Readmission risk has been reviewed: Yes (n/a LISTED) Transition of care needs: no transition of care needs at this time

## 2023-02-25 NOTE — Consult Note (Signed)
Neurology Consultation Reason for Consult: Right facial droop and numbness Requesting Physician: Odie Sera   CC: Dysarthria, right facial droop and mild right sided weakness   History is obtained from: Patient and chart review   HPI: [name] is a 66 y.o. female with a past medical history significant for hypertension on verapamil, ADD on stimulants, hormone replacement therapy, irritable bowel syndrome, Raynaud's  She was in her usual state of health on waking today.  At around noon (initially reported as 10:30 AM), she began to have some slurred speech.  She notes she has had some mild feeling of tongue swelling lasting 1 or 2 days in the past few months and so she did not think too much of this initially.  However she subsequently developed a right facial droop as well as right facial numbness.  While awaiting further workup this did progress to some numbness/paresthesias in her right arm and right foot as well.  She did have COVID recently (tested + Saturday, July 6, and tested negative this Sunday).    She notes there was an issue with refilling her verapamil about 3 months ago.  She has not therefore not been taking her verapamil but has been tracking her blood pressures at home and reports they have been running typically in the 120s to 130s / 80s to 90s  She has taken estradiol for several years to control postmenopausal symptoms and does notice it when she misses a dose of this medication.  However her symptoms are not very severe and she is willing to discontinue this medication due to its association with increased risk of stroke  LKW: Noon on 7/16  Thrombolytic given?: No, out of the window IA performed?: No, exam not c/w LVO Premorbid modified rankin scale:      0 - No symptoms, works in [employer]   ROS: All other review of systems was negative except as noted in the HPI.   Past Medical History:  Diagnosis Date   ADD    ALLERGIC RHINITIS    GERD     HOMOCYSTINEMIA    HYPERTENSION    IBS    Incidental lung nodule 02/2011 ct   unchanged 07/16/11 CT, 6 mo f/u  planned   Past Surgical History:  Procedure Laterality Date   ABDOMINAL HYSTERECTOMY     Bladder tack  2001   Current Outpatient Medications  Medication Instructions   amphetamine-dextroamphetamine (ADDERALL) 15 MG tablet 1 tablet, Oral, 3 times daily   benzonatate (TESSALON) 100 mg, Oral, 2 times daily PRN   estradiol (ESTRACE) 1 MG tablet TAKE 1 TABLET BY MOUTH EVERY DAY   loratadine (CLARITIN) 10 mg, Daily PRN   omeprazole (PRILOSEC) 20 mg, Oral, Daily   polyethylene glycol powder (GLYCOLAX/MIRALAX) 17 g, Oral, 2 times daily PRN   predniSONE (STERAPRED UNI-PAK 21 TAB) 10 MG (21) TBPK tablet Use as directed   triamcinolone ointment (KENALOG) 0.5 % 1 Application, Topical, 2 times daily   triamterene-hydrochlorothiazide (MAXZIDE-25) 37.5-25 MG tablet 1 tablet, Oral, Daily   valACYclovir (VALTREX) 2,000 mg, Oral, 2 times daily PRN   verapamil (CALAN) 120 MG tablet TAKE 1 TABLET(120 MG) BY MOUTH THREE TIMES DAILY   Past Surgical History:  Procedure Laterality Date   ABDOMINAL HYSTERECTOMY     Bladder tack  2001     Family History  Problem Relation Age of Onset   Coronary artery disease Mother    Hypertension Mother    Heart failure Mother    Parkinson's disease  Mother    Coronary artery disease Father    Hypertension Father    Diabetes Father    Stroke Father    Coronary artery disease Maternal Aunt    Stroke Maternal Aunt    Parkinson's disease Maternal Aunt    Stroke Maternal Uncle    Stroke Paternal Uncle    Hypertension Maternal Grandmother    Hypertension Maternal Grandfather    Colon cancer Maternal Grandfather    Hypertension Paternal Grandmother    Hypertension Paternal Grandfather    Colon cancer Paternal Grandfather    Colon cancer Cousin     Social History:  reports that she has quit smoking. Her smoking use included cigarettes. She started  smoking about 46 years ago. She has never used smokeless tobacco. She reports current alcohol use. She reports that she does not use drugs.  Cigarette use was minimal during her teenage years  Exam: Current vital signs: BP (!) 168/109   Pulse 74   Temp 98.8 F (37.1 C) (Oral)   Resp 17   SpO2 100%  Vital signs in last 24 hours: Temp:  [97.8 F (36.6 C)-98.8 F (37.1 C)] 98.8 F (37.1 C) (07/16 2030) Pulse Rate:  [68-82] 74 (07/17 0030) Resp:  [13-21] 17 (07/17 0030) BP: (152-188)/(98-109) 168/109 (07/17 0030) SpO2:  [95 %-100 %] 100 % (07/17 0030)   Physical Exam  Constitutional: Appears well-developed and well-nourished.  Psych: Affect appropriate to situation, pleasant, cooperative Eyes: No scleral injection HENT: No oropharyngeal obstruction.  MSK: Some arthritic changes of her bilateral hands Cardiovascular: Normal rate and regular rhythm. Perfusing extremities well Respiratory: Effort normal, non-labored breathing GI: Soft.  No distension. There is no tenderness.  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 Patient is able to give a clear and coherent history. No signs of aphasia or neglect Cranial Nerves: II: Visual Fields are full. Pupils are equal, round, and reactive to light.   III,IV, VI: EOMI without ptosis or diploplia.  V: Facial sensation is symmetric reduced in V2 and V3 on the right VII: Facial movement is notable for prominent right facial droop VIII: hearing is intact to voice X: Uvula elevates symmetrically XI: Shoulder shrug is symmetric. XII: tongue is midline without atrophy or fasciculations.  Motor: Tone is normal. Bulk is normal. 5/5 strength was present in all four extremities, except for very mild right deltoid weakness 4++/5 and right knee flexion weakness 4++/5.  On  pronator drift testing with significant distraction minor drift of the right upper extremity (not scored on NIH) Sensory: Reduced sensation in the right hand and right foot Deep Tendon Reflexes: 2+ and symmetric in the brachioradialis and patellae.  Cerebellar: FNF and HKS are intact bilaterally Gait:  Deferred  NIHSS total 4 Score breakdown: 2 points for right facial droop, 1 point for sensory changes, 1 point for dysarthria    I have reviewed labs in epic and the results pertinent to this consultation are:  Basic Metabolic Panel: Recent Labs  Lab 02/24/23 1719  NA 137  K 3.0*  CL 100  CO2 26  GLUCOSE 107*  BUN 17  CREATININE 0.83  CALCIUM 9.2    CBC: Recent Labs  Lab 02/24/23 1719  WBC 5.0  NEUTROABS 3.0  HGB 13.7  HCT 39.2  MCV 91.2  PLT 293    Coagulation Studies: Recent Labs  02/24/23 1719  LABPROT 12.2  INR 0.9    Lab Results  Component Value Date   CHOL 188 03/07/2020   HDL 80 03/07/2020   LDLCALC 87 03/07/2020   LDLDIRECT 111.7 12/03/2007   TRIG 116 03/07/2020   CHOLHDL 2.4 03/07/2020   Lab Results  Component Value Date   HGBA1C 5.5 02/25/2023     I have reviewed the images obtained:   Head CT personally reviewed, agree with radiology no acute intracranial process  CTA personally reviewed and reviewed with patient at bedside, agree with radiology No emergent finding. Limited atheromatous change for age without stenosis or irregularity of major arteries in the head and neck.   MRI brain personally reviewed, agree with radiology and reviewed with patient at bedside:   1. Small focus of acute ischemia within the left basal ganglia. No hemorrhage or mass effect. 2. Findings of chronic small vessel ischemia.  Impression: Small vessel stroke, much less likely to be cardioembolic given location and size, but prudent to complete full stroke workup for medical optimization  Recommendations: # Left basal ganglia / internal capsule  stroke - Stroke labs HgbA1c, fasting lipid panel - CTA head/neck - Frequent neuro checks, q2 hours followed by q4 hours  - Echocardiogram - Prophylactic therapy-Antiplatelet med: Aspirin - dose 325mg  PO or 300mg  PR, followed by 81 mg daily - If no contraindications, please give Plavix 300 mg load followed by 75 mg daily for 21 day course - Risk factor modification - Telemetry monitoring;  - Blood pressure goal   - Permissive hypertension to 220/120 due to acute stroke for 24-48 hrs - PT consult, OT consult, Speech consult - Discontinue home estradiol  - Stroke team to follow  Brooke Dare MD-PhD [facility] [phone] Available 7 PM to 7 AM, outside of these hours please call Neurologist on call as listed on Amion.

## 2023-02-25 NOTE — ED Provider Notes (Addendum)
Patient transferred from outside facility for facial droop to get MRI.  I was notified that patient is complaining of some new right foot numbness and tingling.  I evaluated the patient and she states that she is also having some symptoms in her right arm.  I do note slight drift of the right arm, but she has good resisted strength testing.  She does have some subjective sensation changes.  Due to these new changes, I called and spoke with Dr. Iver Nestle, who recommends proceeding with MRI unless profoundly elevated blood pressure (more so than normal).  MRI results and shows acute left basal ganglia infarct.  I notified Dr. Iver Nestle, who recommends giving aspirin and Plavix.  She will consult on the patient.  Recommends medicine admission.  I consulted with Dr. Antionette Char, who is appreciated for admitting.   Roxy Horseman, PA-C 02/25/23 0228    Roxy Horseman, PA-C 02/25/23 0228    Gilda Crease, MD 02/25/23 [phone]

## 2023-02-26 ENCOUNTER — Telehealth: Payer: Self-pay

## 2023-02-26 NOTE — Transitions of Care (Post Inpatient/ED Visit) (Unsigned)
   02/26/2023  Name: [name] MRN: [id number] DOB: 03-30-1956  Today's TOC FU Call Status: Today's TOC FU Call Status:: Unsuccessul Call (1st Attempt) Unsuccessful Call (1st Attempt) Date: 02/26/23  Attempted to reach the patient regarding the most recent Inpatient/ED visit.  Follow Up Plan: Additional outreach attempts will be made to reach the patient to complete the Transitions of Care (Post Inpatient/ED visit) call.   Signature  Woodfin Ganja LPN [facility] Nurse Health Advisor Direct Dial [phone]

## 2023-03-02 ENCOUNTER — Other Ambulatory Visit: Payer: Self-pay | Admitting: Internal Medicine

## 2023-03-02 DIAGNOSIS — F988 Other specified behavioral and emotional disorders with onset usually occurring in childhood and adolescence: Secondary | ICD-10-CM

## 2023-03-02 MED ORDER — AMPHETAMINE-DEXTROAMPHETAMINE 15 MG PO TABS
15.0000 mg | ORAL_TABLET | Freq: Three times a day (TID) | ORAL | 0 refills | Status: DC
Start: 2023-03-02 — End: 2023-03-31

## 2023-03-02 NOTE — Transitions of Care (Post Inpatient/ED Visit) (Signed)
03/02/2023  Name: [name] MRN: [id number] DOB: 30-Oct-1955  Today's TOC FU Call Status: Today's TOC FU Call Status:: Successful TOC FU Call Competed Unsuccessful Call (1st Attempt) Date: 02/26/23 [facility] FU Call Complete Date: 03/02/23  Transition Care Management Follow-up Telephone Call Date of Discharge: 02/25/23 Discharge Facility: [facility]) Type of Discharge: Inpatient Admission Primary Inpatient Discharge Diagnosis:: Acute ischemic stroke How have you been since you were released from the hospital?: Better Any questions or concerns?: No  Items Reviewed: Did you receive and understand the discharge instructions provided?: Yes Medications obtained,verified, and reconciled?: Yes (Medications Reviewed) Any new allergies since your discharge?: No Dietary orders reviewed?: Yes Do you have support at home?: Yes  Medications Reviewed Today: Medications Reviewed Today     Reviewed by Merleen Nicely, LPN (Licensed Practical Nurse) on 03/02/23 at 1012  Med List Status: <None>   Medication Order Taking? Sig Documenting Provider Last Dose Status Informant  amphetamine-dextroamphetamine (ADDERALL) 15 MG tablet [id number] Yes Take 1 tablet by mouth 3 (three) times daily.  Patient taking differently: Take 15 mg by mouth 2 (two) times daily as needed (focus).   Pincus Sanes, MD Taking Active Self, Pharmacy Records  aspirin EC 81 MG tablet [id number] Yes Take 1 tablet (81 mg total) by mouth daily for 90 days then as directed by MD. Swallow whole. Tyrone Nine, MD Taking Active   clopidogrel (PLAVIX) 75 MG tablet [id number] Yes Take 1 tablet (75 mg total) by mouth daily. Tyrone Nine, MD Taking Active   lidocaine 4 % [id number] Yes Place 1 patch onto the skin as needed (pain). [provider] Taking Active Self  loratadine (CLARITIN) 10 MG tablet [id number] Yes Take 10 mg by mouth daily. [provider] Taking Active Self  pantoprazole (PROTONIX) 40 MG tablet  [id number] Yes Take 1 tablet (40 mg total) by mouth daily. Tyrone Nine, MD Taking Active   polyethylene glycol powder [facility]) 17 GM/SCOOP powder [id number] Yes Take 17 g by mouth 2 (two) times daily as needed.  Patient taking differently: Take 17 g by mouth as needed for mild constipation or moderate constipation.   Pincus Sanes, MD Taking Active Self  rosuvastatin (CRESTOR) 20 MG tablet [id number] Yes Take 1 tablet (20 mg total) by mouth daily. Tyrone Nine, MD Taking Active   Study - OCEANIC-STROKE - asundexian 50 mg or placebo tablet (PI-Sethi) [id number] Yes Take 1 tablet (50 mg total) by mouth daily. Tyrone Nine, MD Taking Active   triamterene-hydrochlorothiazide [facility]) 37.5-25 MG tablet [id number] Yes Take 1 tablet by mouth daily. Pincus Sanes, MD Taking Active Self           Med Note (CRUTHIS, CHLOE C   Wed Feb 25, 2023 10:54 AM) No fill hx found for this medication. Pt is adamant she has this medication at home and is taking it daily.   valACYclovir (VALTREX) 1000 MG tablet [id number] Yes Take 2 tablets (2,000 mg total) by mouth 2 (two) times daily as needed.  Patient taking differently: Take 1,000 mg by mouth 3 (three) times daily as needed (fever blisters).   Pincus Sanes, MD Taking Active Self            Home Care and Equipment/Supplies: Were Home Health Services Ordered?: No Any new equipment or medical supplies ordered?: No  Functional Questionnaire: Do you need assistance with bathing/showering or dressing?: No Do you need assistance with meal preparation?: No Do you need assistance with  eating?: No Do you have difficulty maintaining continence: No Do you need assistance with getting out of bed/getting out of a chair/moving?: No Do you have difficulty managing or taking your medications?: No  Follow up appointments reviewed: PCP Follow-up appointment confirmed?: (S) No (pt needs appt by 03-11-23 - no available appts- she has appt on 03-18-23 but that  is out of the 14 day discharge window- will ask front desk to call pt to schedule fu appt) MD Provider Line [phone] Given: Yes Follow-up Provider: [facility] Follow-up appointment confirmed?: Yes Date of Specialist follow-up appointment?: 03/13/23 Follow-Up Specialty Provider:: rehab- PT and speech and 03-16-23 OT Do you need transportation to your follow-up appointment?: No Do you understand care options if your condition(s) worsen?: Yes-patient verbalized understanding    SIGNATURE  Woodfin Ganja LPN [facility] Nurse Health Advisor Direct Dial [phone]

## 2023-03-05 NOTE — Telephone Encounter (Signed)
Noted.  Message under upcoming appointment for Dr. Lawerance Bach to review with patient.

## 2023-03-08 ENCOUNTER — Encounter: Payer: Self-pay | Admitting: Internal Medicine

## 2023-03-08 DIAGNOSIS — E785 Hyperlipidemia, unspecified: Secondary | ICD-10-CM | POA: Insufficient documentation

## 2023-03-08 DIAGNOSIS — E7800 Pure hypercholesterolemia, unspecified: Secondary | ICD-10-CM | POA: Insufficient documentation

## 2023-03-08 NOTE — Progress Notes (Signed)
Subjective:    Patient ID: [name], female    DOB: 04-27-1956, 67 y.o.   MRN: [id number]     HPI [name] is here for follow up from the hospital.   Admitted to observation 02/24/23 - presented with right facial droop, slurred speech and right facial numbness.  That morning sometime between 10:30 am-noon she noticed dysarthria, right facial droop, and right face numbness.   Then developed weakness and numbness in right hand and food.  No dysphagia, fever.  In ED K 3.0, EKG NSR, QTc 518.  MRI brain - small focus acute ischemia in left basal ganglia.  Neuro consulted.  Treated with ASA 324 mg, plavix 300 mg, potassium 30 mEq IV.  Discharge 02/25/2023 to home  Acute ischemic CVA: Caused right facial droop/numbness, dysarthria, right hand/foot weakness/numbness Neuro evaluated Pt CTA w/o LVO Echo w/o CES DAPT x 3 week then ASA alone Enrolled in study with neuro Started on crestor  Omeprazole changed to pantoprazole while on plavix Estradiol stopped Neuro follow up-does not have appointment scheduled Referral to neuro rehab outpatient-scheduled for 8/2   Still with residual weakness in right side of mouth and dysarthria.  It has improved.  No right hand or right foot symptoms.  Having muscle pain since starting Crestor.  Taking Tylenol and using heating pad which helps a little.  Blood pressure better compared to what it was in the hospital, but still elevated at times  Medications and allergies reviewed with patient and updated if appropriate.  Current Outpatient Medications on File Prior to Visit  Medication Sig Dispense Refill   amphetamine-dextroamphetamine (ADDERALL) 15 MG tablet Take 1 tablet by mouth 3 (three) times daily. 90 tablet 0   aspirin EC 81 MG tablet Take 1 tablet (81 mg total) by mouth daily for 90 days then as directed by MD. Swallow whole. 120 tablet 0   clopidogrel (PLAVIX) 75 MG tablet Take 1 tablet (75 mg total) by mouth daily. 21 tablet 0    lidocaine 4 % Place 1 patch onto the skin as needed (pain).     loratadine (CLARITIN) 10 MG tablet Take 10 mg by mouth daily.     pantoprazole (PROTONIX) 40 MG tablet Take 1 tablet (40 mg total) by mouth daily. 21 tablet 0   polyethylene glycol powder (GLYCOLAX/MIRALAX) 17 GM/SCOOP powder Take 17 g by mouth 2 (two) times daily as needed. (Patient taking differently: Take 17 g by mouth as needed for mild constipation or moderate constipation.) 3350 g 1   rosuvastatin (CRESTOR) 20 MG tablet Take 1 tablet (20 mg total) by mouth daily. 90 tablet 0   Study - OCEANIC-STROKE - asundexian 50 mg or placebo tablet (PI-Sethi) Take 1 tablet (50 mg total) by mouth daily.     triamterene-hydrochlorothiazide (MAXZIDE-25) 37.5-25 MG tablet Take 1 tablet by mouth daily. 90 tablet 3   valACYclovir (VALTREX) 1000 MG tablet Take 2 tablets (2,000 mg total) by mouth 2 (two) times daily as needed. (Patient taking differently: Take 1,000 mg by mouth 3 (three) times daily as needed (fever blisters).) 60 tablet 0   No current facility-administered medications on file prior to visit.     Review of Systems  Constitutional:  Negative for fever.  Respiratory:  Positive for shortness of breath (with bending over). Negative for cough and wheezing.   Cardiovascular:  Negative for chest pain, palpitations and leg swelling.  Neurological:  Negative for dizziness, light-headedness, numbness and headaches.  Objective:   Vitals:   03/09/23 1009  BP: 138/88  Pulse: 88  Temp: 98.3 F (36.8 C)  SpO2: 99%   BP Readings from Last 3 Encounters:  03/09/23 138/88  02/25/23 (!) 170/96  04/24/22 126/82   Wt Readings from Last 3 Encounters:  03/09/23 167 lb (75.8 kg)  02/25/23 160 lb (72.6 kg)  04/24/22 165 lb (74.8 kg)   Body mass index is 26.95 kg/m.    Physical Exam Constitutional:      General: She is not in acute distress.    Appearance: Normal appearance.  HENT:     Head: Normocephalic and atraumatic.   Eyes:     Conjunctiva/sclera: Conjunctivae normal.  Cardiovascular:     Rate and Rhythm: Normal rate and regular rhythm.     Heart sounds: Normal heart sounds. No murmur heard. Pulmonary:     Effort: Pulmonary effort is normal. No respiratory distress.     Breath sounds: Normal breath sounds. No wheezing.  Musculoskeletal:     Cervical back: Neck supple.     Right lower leg: No edema.     Left lower leg: No edema.  Lymphadenopathy:     Cervical: No cervical adenopathy.  Skin:    General: Skin is warm and dry.     Findings: No rash.  Neurological:     Mental Status: She is alert and oriented to person, place, and time.     Sensory: No sensory deficit.     Motor: Weakness (Right lower face) present.     Gait: Gait normal.     Comments: Mild dysarthria  Psychiatric:        Mood and Affect: Mood normal.        Behavior: Behavior normal.        Lab Results  Component Value Date   WBC 5.1 02/25/2023   HGB 12.5 02/25/2023   HCT 36.7 02/25/2023   PLT 253 02/25/2023   GLUCOSE 93 02/25/2023   CHOL 195 02/25/2023   TRIG 94 02/25/2023   HDL 66 02/25/2023   LDLDIRECT 111.7 12/03/2007   LDLCALC 110 (H) 02/25/2023   ALT 25 02/25/2023   AST 25 02/25/2023   NA 136 02/25/2023   K 3.4 (L) 02/25/2023   CL 103 02/25/2023   CREATININE 0.73 02/25/2023   BUN 13 02/25/2023   CO2 24 02/25/2023   TSH 1.56 12/14/2014   INR 0.9 02/24/2023   HGBA1C 5.5 02/25/2023   ECHOCARDIOGRAM COMPLETE    ECHOCARDIOGRAM REPORT       Patient Name:   [name] Date of Exam: 02/25/2023 Medical Rec #:  [id number]       Height:       66.0 in Accession #:    [id number]      Weight:       160.0 lb Date of Birth:  1955/08/28       BSA:          1.819 m Patient Age:    67 years        BP:           184/102 mmHg Patient Gender: F               HR:           78 bpm. Exam Location:  Inpatient  Procedure: 2D Echo, Color Doppler and Cardiac Doppler  Indications:    Stroke   History:        Patient  has no prior history  of Echocardiogram examinations.                 Risk Factors:Hypertension, Former Smoker and Family History of                 Coronary Artery Disease.   Sonographer:    Milda Smart Referring Phys: [id number] TIMOTHY S OPYD    Sonographer Comments: Image acquisition challenging due to respiratory motion. IMPRESSIONS   1. Left ventricular ejection fraction, by estimation, is 60 to 65%. The left ventricle has normal function. The left ventricle has no regional wall motion abnormalities. There is mild concentric left ventricular hypertrophy. Left ventricular diastolic  parameters were normal.  2. Right ventricular systolic function is normal. The right ventricular size is normal.  3. The mitral valve is normal in structure. No evidence of mitral valve regurgitation. No evidence of mitral stenosis.  4. The aortic valve is tricuspid. Aortic valve regurgitation is not visualized. No aortic stenosis is present.  5. Aortic dilatation noted. There is borderline dilatation of the ascending aorta, measuring 38 mm.  6. The inferior vena cava is normal in size with greater than 50% respiratory variability, suggesting right atrial pressure of 3 mmHg.  FINDINGS  Left Ventricle: Left ventricular ejection fraction, by estimation, is 60 to 65%. The left ventricle has normal function. The left ventricle has no regional wall motion abnormalities. The left ventricular internal cavity size was normal in size. There is  mild concentric left ventricular hypertrophy. Left ventricular diastolic parameters were normal.  Right Ventricle: The right ventricular size is normal. No increase in right ventricular wall thickness. Right ventricular systolic function is normal.  Left Atrium: Left atrial size was normal in size.  Right Atrium: Right atrial size was normal in size.  Pericardium: There is no evidence of pericardial effusion.  Mitral Valve: The mitral valve is normal in structure. No  evidence of mitral valve regurgitation. No evidence of mitral valve stenosis. MV peak gradient, 5.2 mmHg. The mean mitral valve gradient is 3.0 mmHg.  Tricuspid Valve: The tricuspid valve is normal in structure. Tricuspid valve regurgitation is not demonstrated. No evidence of tricuspid stenosis.  Aortic Valve: The aortic valve is tricuspid. Aortic valve regurgitation is not visualized. No aortic stenosis is present.  Pulmonic Valve: The pulmonic valve was normal in structure. Pulmonic valve regurgitation is not visualized. No evidence of pulmonic stenosis.  Aorta: Aortic dilatation noted and the aortic root is normal in size and structure. There is borderline dilatation of the ascending aorta, measuring 38 mm.  Venous: The inferior vena cava is normal in size with greater than 50% respiratory variability, suggesting right atrial pressure of 3 mmHg.  IAS/Shunts: No atrial level shunt detected by color flow Doppler.    LEFT VENTRICLE PLAX 2D LVIDd:         3.60 cm     Diastology LVIDs:         2.50 cm     LV e' medial:    7.07 cm/s LV PW:         1.20 cm     LV E/e' medial:  12.6 LV IVS:        1.20 cm     LV e' lateral:   9.14 cm/s LVOT diam:     2.10 cm     LV E/e' lateral: 9.8 LV SV:         88 LV SV Index:   49 LVOT Area:     3.46 cm  LV Volumes (MOD) LV vol d, MOD A2C: 45.7 ml LV vol d, MOD A4C: 91.0 ml LV vol s, MOD A2C: 20.5 ml LV vol s, MOD A4C: 30.3 ml LV SV MOD A2C:     25.2 ml LV SV MOD A4C:     91.0 ml LV SV MOD BP:      44.2 ml  RIGHT VENTRICLE             IVC RV Basal diam:  3.70 cm     IVC diam: 1.30 cm RV Mid diam:    2.00 cm RV S prime:     12.10 cm/s  LEFT ATRIUM             Index        RIGHT ATRIUM           Index LA diam:        3.70 cm 2.03 cm/m   RA Area:     14.50 cm LA Vol (A2C):   45.7 ml 25.13 ml/m  RA Volume:   37.70 ml  20.73 ml/m LA Vol (A4C):   37.2 ml 20.48 ml/m LA Biplane Vol: 47.7 ml 26.23 ml/m  AORTIC VALVE LVOT Vmax:   119.00  cm/s LVOT Vmean:  88.500 cm/s LVOT VTI:    0.255 m   AORTA Ao Root diam: 3.30 cm Ao Asc diam:  3.80 cm  MITRAL VALVE               TRICUSPID VALVE MV Area (PHT): 2.68 cm    TR Peak grad:   33.2 mmHg MV Area VTI:   2.97 cm    TR Mean grad:   22.0 mmHg MV Peak grad:  5.2 mmHg    TR Vmax:        288.00 cm/s MV Mean grad:  3.0 mmHg    TR Vmean:       219.0 cm/s MV Vmax:       1.14 m/s MV Vmean:      83.9 cm/s   SHUNTS MV Decel Time: 283 msec    Systemic VTI:  0.26 m MR Peak grad: 98.0 mmHg    Systemic Diam: 2.10 cm MR Vmax:      495.00 cm/s MV E velocity: 89.20 cm/s MV A velocity: 99.40 cm/s MV E/A ratio:  0.90  Arvilla Meres MD Electronically signed by Arvilla Meres MD Signature Date/Time: 02/25/2023/11:53:44 AM      Final   CT ANGIO HEAD NECK W WO CM CLINICAL DATA:  Stroke, determine embolic source. Right facial droop and slurred speech with right facial numbness.  EXAM: CT ANGIOGRAPHY HEAD AND NECK WITH AND WITHOUT CONTRAST  TECHNIQUE: Multidetector CT imaging of the head and neck was performed using the standard protocol during bolus administration of intravenous contrast. Multiplanar CT image reconstructions and MIPs were obtained to evaluate the vascular anatomy. Carotid stenosis measurements (when applicable) are obtained utilizing NASCET criteria, using the distal internal carotid diameter as the denominator.  RADIATION DOSE REDUCTION: This exam was performed according to the departmental dose-optimization program which includes automated exposure control, adjustment of the mA and/or kV according to patient size and/or use of iterative reconstruction technique.  CONTRAST:  75mL OMNIPAQUE IOHEXOL 350 MG/ML SOLN  COMPARISON:  Brain MRI from earlier the same day  FINDINGS: CT HEAD FINDINGS  Brain: The patient's acute lacunar infarct is currently occult by head CT. No hemorrhage, hydrocephalus, or masslike finding. Chronic small vessel ischemic gliosis  in the cerebral white matter.  Vascular: See below  Skull: Negative  Sinuses/Orbits: Negative  Review of the MIP images confirms the above findings  CTA NECK FINDINGS  Aortic arch: Negative  Right carotid system: Limited atheromatous plaque at the bifurcation. No stenosis, ulceration, or dissection.  Left carotid system: No stenosis, ulceration, or dissection.  Vertebral arteries: Notable tortuosity of the vertebral arteries. No subclavian or vertebral stenosis or ulceration.  Skeleton: Generalized cervical spine degeneration. Mild C4-5 anterolisthesis  Other neck: Negative  Upper chest: Clear apical lungs  Review of the MIP images confirms the above findings  CTA HEAD FINDINGS  Anterior circulation: No significant stenosis, proximal occlusion, aneurysm, or vascular malformation.  Posterior circulation: No significant stenosis, proximal occlusion, aneurysm, or vascular malformation.  Venous sinuses: As permitted by contrast timing, patent.  Review of the MIP images confirms the above findings  IMPRESSION: No emergent finding. Limited atheromatous change for age without stenosis or irregularity of major arteries in the head and neck.  Electronically Signed   By: Tiburcio Pea M.D.   On: 02/25/2023 04:30 MR BRAIN WO CONTRAST CLINICAL DATA:  Transient ischemic attack  EXAM: MRI HEAD WITHOUT CONTRAST  TECHNIQUE: Multiplanar, multiecho pulse sequences of the brain and surrounding structures were obtained without intravenous contrast.  COMPARISON:  08/09/2003  FINDINGS: Brain: Small focus of acute ischemia within the left basal ganglia. Chronic microhemorrhage in the left basal ganglia. There is multifocal hyperintense T2-weighted signal within the white matter. Parenchymal volume and CSF spaces are normal. The midline structures are normal.  Vascular: Major flow voids are preserved.  Skull and upper cervical spine: Normal calvarium and skull  base. Visualized upper cervical spine and soft tissues are normal.  Sinuses/Orbits:No paranasal sinus fluid levels or advanced mucosal thickening. No mastoid or middle ear effusion. Normal orbits.  IMPRESSION: 1. Small focus of acute ischemia within the left basal ganglia. No hemorrhage or mass effect. 2. Findings of chronic small vessel ischemia.  Electronically Signed   By: Deatra Robinson M.D.   On: 02/25/2023 01:32    Assessment & Plan:    See Problem List for Assessment and Plan of chronic medical problems.

## 2023-03-08 NOTE — Patient Instructions (Incomplete)
      Blood work was ordered.   The lab is on the first floor.    Medications changes include :       A referral was ordered and someone will call you to schedule an appointment.     No follow-ups on file.

## 2023-03-09 ENCOUNTER — Ambulatory Visit (INDEPENDENT_AMBULATORY_CARE_PROVIDER_SITE_OTHER): Payer: Self-pay | Admitting: Internal Medicine

## 2023-03-09 VITALS — BP 138/88 | HR 88 | Temp 98.3°F | Ht 66.0 in | Wt 167.0 lb

## 2023-03-09 DIAGNOSIS — I10 Essential (primary) hypertension: Secondary | ICD-10-CM

## 2023-03-09 DIAGNOSIS — F988 Other specified behavioral and emotional disorders with onset usually occurring in childhood and adolescence: Secondary | ICD-10-CM

## 2023-03-09 DIAGNOSIS — Z8673 Personal history of transient ischemic attack (TIA), and cerebral infarction without residual deficits: Secondary | ICD-10-CM

## 2023-03-09 DIAGNOSIS — E7849 Other hyperlipidemia: Secondary | ICD-10-CM

## 2023-03-09 MED ORDER — VALSARTAN 40 MG PO TABS: 40.0000 mg | ORAL_TABLET | Freq: Every day | ORAL | 5 refills | Status: DC

## 2023-03-09 MED ORDER — ROSUVASTATIN CALCIUM 20 MG PO TABS: 10.0000 mg | ORAL_TABLET | Freq: Every day | ORAL | Status: DC

## 2023-03-09 NOTE — Assessment & Plan Note (Signed)
Chronic Has been on Adderall 50 mg 3 times daily for years Does have some difficulty functioning without the medication Discussed risks/benefits of medication since she had a recent CVA She does feel it would be difficult for her to function without this and we will plan on talking to neurology

## 2023-03-09 NOTE — Assessment & Plan Note (Addendum)
Recent hospitalization for ischemic stroke seen on MRI brain-left basal ganglia Plan aspirin 81 mg daily, Plavix 75 mg daily x 3 weeks-then aspirin 81 mg daily alone Started on Crestor 20 mg daily-having muscle pains we will decrease this to 10 mg daily.  If pain does not resolve we will consider changing medication Estradiol stopped Discussed stopping Adderall given increased risk of strokes-this is something she feels she does need-has difficulty functioning without it.  At times she only takes half of a dose.  Will discuss with neurology To follow-up with neurology-she will call to make an appointment if she does not hear from them Appointment scheduled already with neuro rehab

## 2023-03-09 NOTE — Assessment & Plan Note (Addendum)
Chronic Recently started on Crestor 20 mg daily after acute ischemic CVA Not tolerating medication well-experiencing muscle pain so we will decrease dose to 10 mg daily and if that is not tolerated will consider changing to a different statin Check lipids, hepatic function in 6 weeks

## 2023-03-09 NOTE — Assessment & Plan Note (Addendum)
Chronic Blood pressure not ideally controlled Verapamil discontinued, but she admits she had not been taking it prior to the hospitalization Continue Maxide-25-1 tab daily Start valsartan 40 mg daily She will monitor BP at home Discussed goal BP less than 130/80

## 2023-03-12 NOTE — Therapy (Deleted)
OUTPATIENT SPEECH LANGUAGE PATHOLOGY EVALUATION   Patient Name: [name] MRN: [id number] DOB:04/18/1956, 67 y.o., female Today's Date: 03/12/2023  PCP: Pincus Sanes., MD REFERRING PROVIDER: Tyrone Nine, MD  END OF SESSION:   Past Medical History:  Diagnosis Date   ADD    ALLERGIC RHINITIS    GERD    HOMOCYSTINEMIA    HYPERTENSION    IBS    Incidental lung nodule 02/2011 ct   unchanged 07/16/11 CT, 6 mo f/u  planned   Past Surgical History:  Procedure Laterality Date   ABDOMINAL HYSTERECTOMY     Bladder tack  2001   Patient Active Problem List   Diagnosis Date Noted   Hyperlipidemia 03/08/2023   History of ischemic stroke 02/25/2023   Hypokalemia 02/25/2023   Prolonged Q-T interval on ECG 02/25/2023   Bilateral upper abdominal discomfort 03/13/2021   Hormone replacement therapy (HRT) 02/09/2018   Incidental lung nodule RUL stable 2012-2016 - no f/u needed 01/08/2012   Eczema 01/08/2012   Attention deficit disorder 04/14/2007   Essential hypertension 12/09/2006   ALLERGIC RHINITIS 12/09/2006   IBS 12/09/2006    ONSET DATE: 02/25/2023   REFERRING DIAG: I63.9  THERAPY DIAG:  No diagnosis found.  Rationale for Evaluation and Treatment: {HABREHAB:27488}  SUBJECTIVE:   SUBJECTIVE STATEMENT: *** Pt accompanied by: {accompnied:27141}  PERTINENT HISTORY: 02/25/2023 presented to ED with R facial droop, slurred speech, R facial numbness. MRI Brain revealed small focus of acute ischemia within L basal ganglia as well as findings of chronic small vessel ischemia. PMH includes HTN, IBS, ADD, recent COVID infection   PAIN:  Are you having pain? {OPRCPAIN:27236}  FALLS: Has patient fallen in last 6 months?  See PT evaluation for details  LIVING ENVIRONMENT: Lives with: {OPRC lives with:25569::"lives with their family"} Lives in: {Lives in:25570}  PLOF:  Level of assistance: {YTKZSWF:09323} Employment: {SLPemployment:25674}  PATIENT GOALS:  ***  OBJECTIVE:   DIAGNOSTIC FINDINGS: Per IP ST eval, "Pt reports living alone and handling all finances and medications independently. 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 She presents with a R facial droop which likley contributes to mild dysarthria, although intelligibility is only slightly reduced at the conversation level. Pt reports no acute difficulties with memory, only exhaustion from being awake 24+ hours. She scored a 23/30 on the SLUMS characterized by difficulty with sustained attention, recall, and problem solving. Both pt and visitor subjectively report that she is at her baseline. SLP provided education regarding increased assitance after discharge with more complex cognitive tasks and option for OP ST f/u if pt notes any acute difficulties. Recommend OP SLP f/u for dysarthria and cognition PRN. Will s/o acutely. "  COGNITION: Overall cognitive status: {cognition:24006} Areas of impairment:  {cognitiveimpairmentslp:27409} Functional deficits: ***  COGNITIVE COMMUNICATION: Following directions: {commands:24018}  Auditory comprehension: {WFL-Impaired:25365} Verbal expression: {WFL-Impaired:25365} Functional communication: {WFL-Impaired:25365}  ORAL MOTOR EXAMINATION: Overall status: {OMESLP2:27645} Comments: ***  MOTOR SPEECH: assessed across variety of speech tasks: reading, word repetition, generative discourse sample Overall motor speech: {slpimpaired:27210} Level of impairment: {SLP level of impairment:25441} Rate of Speech: {slprateofspeech:28668} Dysfluencies: {SLP dysfluncies:28669} Phonation: {SLP phonation:25439} Oral reading loudness average: *** dB Conversational loudness average: *** dB Voice Quality: {VQL:27192} Respiration: {respbreathing:27195} Word and Phrasal Stress: {SLP stress:28670} Resonance: {SLP resonance:25440} Articulation: {SLParticulation:27218} Diadochokinetic Rate (DDK): {SLP FTD:32202} Intelligibility: {SLP  Intelligible:25442} Motor planning: {slpmotorspeecherrors:27220} Interfering components: {SLP Interfering components (MS):25444} Effective technique: {SLP effective technique (MS):25445}   STANDARDIZED ASSESSMENTS: {SLPstandardizedassessment:27092}  PATIENT REPORTED OUTCOME MEASURES (PROM): {SLPPROM:27095}   TODAY'S  TREATMENT:                                                                                                                                         03/13/23: ***   PATIENT EDUCATION: Education details: *** Person educated: {Person educated:25204} Education method: {Education Method:25205} Education comprehension: {Education Comprehension:25206}   GOALS: Goals reviewed with patient? Yes  SHORT TERM GOALS: Target date: ***  *** Baseline: Goal status: INITIAL  2.  *** Baseline:  Goal status: INITIAL  3.  *** Baseline:  Goal status: INITIAL  4.  *** Baseline:  Goal status: INITIAL  5.  *** Baseline:  Goal status: INITIAL  6.  *** Baseline:  Goal status: INITIAL  LONG TERM GOALS: Target date: ***  *** Baseline:  Goal status: INITIAL  2.  *** Baseline:  Goal status: INITIAL  3.  *** Baseline:  Goal status: INITIAL  4.  *** Baseline:  Goal status: INITIAL  5.  *** Baseline:  Goal status: INITIAL  6.  *** Baseline:  Goal status: INITIAL  ASSESSMENT:  CLINICAL IMPRESSION: Patient is a *** y.o. *** who was seen today for ***.   OBJECTIVE IMPAIRMENTS: include {SLPOBJIMP:27107}. These impairments are limiting patient from {SLPLIMIT:27108}. Factors affecting potential to achieve goals and functional outcome are {SLP factors:25450}.. Patient will benefit from skilled SLP services to address above impairments and improve overall function.  REHAB POTENTIAL: {rehabpotential:25112}  PLAN:  SLP FREQUENCY: {rehab frequency:25116}  SLP DURATION: {rehab duration:25117}  PLANNED INTERVENTIONS: {SLP  treatment/interventions:25449}    Maia Breslow, CCC-SLP 03/12/2023, 1:24 PM

## 2023-03-13 ENCOUNTER — Ambulatory Visit: Payer: Self-pay | Admitting: Speech Pathology

## 2023-03-13 ENCOUNTER — Ambulatory Visit: Payer: Self-pay | Admitting: Physical Therapy

## 2023-03-16 ENCOUNTER — Ambulatory Visit: Payer: Self-pay | Admitting: Occupational Therapy

## 2023-03-18 ENCOUNTER — Ambulatory Visit: Payer: Self-pay | Admitting: Internal Medicine

## 2023-03-25 ENCOUNTER — Encounter: Payer: Self-pay | Admitting: Family Medicine

## 2023-03-25 ENCOUNTER — Telehealth: Payer: Self-pay | Admitting: Internal Medicine

## 2023-03-25 ENCOUNTER — Ambulatory Visit (INDEPENDENT_AMBULATORY_CARE_PROVIDER_SITE_OTHER): Payer: Self-pay | Admitting: Family Medicine

## 2023-03-25 VITALS — BP 132/88 | HR 98 | Temp 98.2°F | Wt 172.8 lb

## 2023-03-25 DIAGNOSIS — M25472 Effusion, left ankle: Secondary | ICD-10-CM

## 2023-03-25 DIAGNOSIS — M25471 Effusion, right ankle: Secondary | ICD-10-CM

## 2023-03-25 DIAGNOSIS — I10 Essential (primary) hypertension: Secondary | ICD-10-CM

## 2023-03-25 DIAGNOSIS — Z8673 Personal history of transient ischemic attack (TIA), and cerebral infarction without residual deficits: Secondary | ICD-10-CM

## 2023-03-25 MED ORDER — TRIAMTERENE-HCTZ 37.5-25 MG PO TABS: 1.0000 | ORAL_TABLET | Freq: Every day | ORAL | 0 refills | Status: DC

## 2023-03-25 NOTE — Telephone Encounter (Signed)
Patient seeing Dr. Clent Ridges today.

## 2023-03-25 NOTE — Telephone Encounter (Signed)
Pt called about ankle swelling. She needed to set an appt but what's available does not work for her. I mentioned she could see someone else regarding her concerns today due to [facility] schedule. She did not want anyone other than who knows her which is [name].  She's okay with speaking with her nurse since [name] next appt will not be until OCT.  She also mentioned fluid came out of no where and she's not sure if its coming from the meds that she's on or not. Please contact her @ [phone] at your earliest convenience. Thank you.

## 2023-03-25 NOTE — Progress Notes (Signed)
   Subjective:    Patient ID: [name], female    DOB: 10-Apr-1956, 67 y.o.   MRN: [id number]  HPI Here for a transitional care visit to follow up on a hospital stay from 02-24-23 to 02-25-23 for a stroke. She presented with right facial droop, slurred speech, and weakness in the right hand and right leg. No headache. A brain MRI revealed a small ischemic stroke in the left basal ganglia. A CTA of the head and neck showed the carotid and vertebral arteries to be clear. An ECHO was unremarkable with an EF of 60-65% and no diastolic dysfunction. Her labs were all normal, including a creatinine of 0.73. after going home she has felt better in general, though she still has slight deficits in the face and the right hand. Her BP has been normal. She took Plavix and ASA for 3 weeks, and then went to ASA alone. Her main concern today is swelling in the ankles, the left being a little worse than the right. There is no pain. No SOB. Dhe has never had this before. The swelling goes down when she elevates her feet. She is taking Valsartan as usual, but she notes that her supply of Triamterene-hydrochlorothiazide ran out about a week ago.    Review of Systems  Constitutional: Negative.   Respiratory: Negative.    Cardiovascular:  Positive for leg swelling. Negative for chest pain and palpitations.  Neurological:  Positive for facial asymmetry, speech difficulty and weakness. Negative for dizziness, tremors, seizures, syncope, light-headedness, numbness and headaches.       Objective:   Physical Exam Constitutional:      Appearance: Normal appearance. She is not ill-appearing.  Cardiovascular:     Rate and Rhythm: Normal rate and regular rhythm.     Pulses: Normal pulses.     Heart sounds: Normal heart sounds.  Pulmonary:     Effort: Pulmonary effort is normal.     Breath sounds: Normal breath sounds.  Musculoskeletal:     Comments: The right ankle has 1+ edema and the left ankle has 2+ edema. No  tenderness  Neurological:     Mental Status: She is alert and oriented to person, place, and time.     Coordination: Coordination normal.     Gait: Gait normal.     Comments: There is a subtle droop on the right side of her mouth            Assessment & Plan:  She is recovering from a stoke, and she will soon start PT, OT, and ST. She is also enrolled in a clinical study called [employer], which is headed up by Dr. Pearlean Brownie. She is experiencing some slight ankle edema, and this is likely related to her running out of Triamterene-hydrochlorothiazide. We will refill this, and she can follow up with her PCP, Dr.Burns, as needed.  [name], MD

## 2023-03-26 ENCOUNTER — Ambulatory Visit: Payer: Self-pay | Admitting: Speech Pathology

## 2023-03-26 ENCOUNTER — Ambulatory Visit: Payer: Self-pay | Admitting: Physical Therapy

## 2023-03-31 ENCOUNTER — Ambulatory Visit: Payer: Self-pay | Attending: Family Medicine | Admitting: Physical Therapy

## 2023-03-31 ENCOUNTER — Encounter: Payer: Self-pay | Admitting: Speech Pathology

## 2023-03-31 ENCOUNTER — Encounter: Payer: Self-pay | Admitting: Internal Medicine

## 2023-03-31 ENCOUNTER — Ambulatory Visit: Payer: Self-pay | Admitting: Speech Pathology

## 2023-03-31 VITALS — BP 152/96 | HR 77

## 2023-03-31 DIAGNOSIS — R471 Dysarthria and anarthria: Secondary | ICD-10-CM | POA: Insufficient documentation

## 2023-03-31 DIAGNOSIS — R2689 Other abnormalities of gait and mobility: Secondary | ICD-10-CM | POA: Insufficient documentation

## 2023-03-31 DIAGNOSIS — F988 Other specified behavioral and emotional disorders with onset usually occurring in childhood and adolescence: Secondary | ICD-10-CM

## 2023-03-31 DIAGNOSIS — R278 Other lack of coordination: Secondary | ICD-10-CM | POA: Insufficient documentation

## 2023-03-31 MED ORDER — AMPHETAMINE-DEXTROAMPHETAMINE 15 MG PO TABS
15.0000 mg | ORAL_TABLET | Freq: Three times a day (TID) | ORAL | 0 refills | Status: DC
Start: 2023-03-31 — End: 2023-04-30

## 2023-03-31 NOTE — Therapy (Signed)
OUTPATIENT SPEECH LANGUAGE PATHOLOGY EVALUATION   Patient Name: [name] MRN: [id number] DOB:1956-02-03, 67 y.o., female Today's Date: 03/31/2023  PCP: Pincus Sanes., MD REFERRING PROVIDER: Tyrone Nine, MD  END OF SESSION:  End of Session - 03/31/23 1010     Visit Number 1    Number of Visits 1    SLP Start Time [phone]   pt arrived late   SLP Stop Time  0845    SLP Time Calculation (min) 33 min    Activity Tolerance Patient tolerated treatment well             Past Medical History:  Diagnosis Date   ADD    ALLERGIC RHINITIS    GERD    HOMOCYSTINEMIA    HYPERTENSION    IBS    Incidental lung nodule 02/2011 ct   unchanged 07/16/11 CT, 6 mo f/u  planned   Past Surgical History:  Procedure Laterality Date   ABDOMINAL HYSTERECTOMY     Bladder tack  2001   Patient Active Problem List   Diagnosis Date Noted   Hyperlipidemia 03/08/2023   History of ischemic stroke 02/25/2023   Hypokalemia 02/25/2023   Prolonged Q-T interval on ECG 02/25/2023   Bilateral upper abdominal discomfort 03/13/2021   Hormone replacement therapy (HRT) 02/09/2018   Incidental lung nodule RUL stable 2012-2016 - no f/u needed 01/08/2012   Eczema 01/08/2012   Attention deficit disorder 04/14/2007   Essential hypertension 12/09/2006   ALLERGIC RHINITIS 12/09/2006   IBS 12/09/2006    ONSET DATE: 02/25/2023   REFERRING DIAG: I63.9  THERAPY DIAG:  No diagnosis found.  Rationale for Evaluation and Treatment: Rehabilitation  SUBJECTIVE:   SUBJECTIVE STATEMENT: "My speech has cleared up a lot"  Pt accompanied by: self  PERTINENT HISTORY: 02/25/2023 presented to ED with R facial droop, slurred speech, R facial numbness. MRI Brain revealed small focus of acute ischemia within L basal ganglia as well as findings of chronic small vessel ischemia. PMH includes HTN, IBS, ADD, recent COVID infection   PAIN:  Are you having pain? No  FALLS: Has patient fallen in last 6 months?  See PT  evaluation for details  LIVING ENVIRONMENT: Lives with: lives alone Lives in: House/apartment  PLOF:  Level of assistance: Independent  Employment: Pensions consultant)   PATIENT GOALS: none stated  OBJECTIVE:   DIAGNOSTIC FINDINGS: Per IP ST eval, "Pt reports living alone and handling all finances and medications independently. 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 She presents with a R facial droop which likley contributes to mild dysarthria, although intelligibility is only slightly reduced at the conversation level. Pt reports no acute difficulties with memory, only exhaustion from being awake 24+ hours. She scored a 23/30 on the SLUMS characterized by difficulty with sustained attention, recall, and problem solving. Both pt and visitor subjectively report that she is at her baseline. SLP provided education regarding increased assitance after discharge with more complex cognitive tasks and option for OP ST f/u if pt notes any acute difficulties. Recommend OP SLP f/u for dysarthria and cognition PRN. Will s/o acutely. "  COGNITION: Overall cognitive status: Within functional limits for tasks assessed Areas of impairment:  None identified   COGNITIVE COMMUNICATION: Overall status: within functional limits for tasks assessed   ORAL MOTOR EXAMINATION: Overall status: WFL  MOTOR SPEECH: assessed across variety of speech tasks: reading, word repetition, generative discourse sample Overall motor speech: impaired  Level of impairment: Conversation Rate of Speech: Variable Dysfluencies: none  evidenced Phonation: normal Conversational loudness average: 73 dB Voice Quality: normal Respiration: thoracic breathing Word and Phrasal Stress: WFL Resonance: WFL Articulation: Impaired: occasional imprecision  Diadochokinetic Rate (DDK): unremarkable Intelligibility: Intelligible Motor planning: Appears intact Effective technique: slow rate  STANDARDIZED  ASSESSMENTS: deferred  PATIENT REPORTED OUTCOME MEASURES (PROM): HDQLIFE Speech Difficulties:  37 All items marked never or rarely with exception of "I slur my words"   TODAY'S TREATMENT:                                                                                                                                         03/31/23: Trained pt on dysarthria strategies and compensations to aid in ongoing recovery related to motor speech.     PATIENT EDUCATION: Education details: dysarthria strategies  Person educated: Patient Education method: Explanation, Demonstration, and Handouts Education comprehension: verbalized understanding and returned demonstration   ASSESSMENT:  CLINICAL IMPRESSION: Patient is a 67 y.o. F who was seen today for motor speech evaluation s/p stroke. Evaluation reveals mild dysarthria. Pt's speech is c/b occasional impercise articulation which did not impact intelligibility. Pt endorses increased "slurring" when stressed, fatigued, or angry but continues to meet communication needs. Pt reports return to work with high communication demands. Pt denies change in cognitive function, denies challenges swallowing. No concerns noted at this time requiring SLP interventions.   OBJECTIVE IMPAIRMENTS: include mild dysarthria. These impairments do not appear to be limiting patient from effectively communicating at home and in community. Skilled ST is not indicated at this time.   PLAN:  SLP FREQUENCY: one time visit  SLP DURATION: other: eval and discharge  Maia Breslow, CCC-SLP 03/31/2023, 10:10 AM

## 2023-03-31 NOTE — Therapy (Signed)
OUTPATIENT PHYSICAL THERAPY NEURO EVALUATION   Patient Name: [name] MRN: [id number] DOB:[birth year], 67 y.o., female Today's Date: 03/31/2023   PCP: Pincus Sanes, MD REFERRING PROVIDER: Cathie Beams, MD  END OF SESSION:  PT End of Session - 03/31/23 0846     Visit Number 1    Number of Visits 1    Date for PT Re-Evaluation 03/31/23    Authorization Type Self Pay    PT Start Time 0845    PT Stop Time 0907   eval   PT Time Calculation (min) 22 min    Activity Tolerance Patient tolerated treatment well    Behavior During Therapy [facility] for tasks assessed/performed             Past Medical History:  Diagnosis Date   ADD    ALLERGIC RHINITIS    GERD    HOMOCYSTINEMIA    HYPERTENSION    IBS    Incidental lung nodule 02/2011 ct   unchanged 07/16/11 CT, 6 mo f/u  planned   Past Surgical History:  Procedure Laterality Date   ABDOMINAL HYSTERECTOMY     Bladder tack  2001   Patient Active Problem List   Diagnosis Date Noted   Hyperlipidemia 03/08/2023   History of ischemic stroke 02/25/2023   Hypokalemia 02/25/2023   Prolonged Q-T interval on ECG 02/25/2023   Bilateral upper abdominal discomfort 03/13/2021   Hormone replacement therapy (HRT) 02/09/2018   Incidental lung nodule RUL stable 2012-2016 - no f/u needed 01/08/2012   Eczema 01/08/2012   Attention deficit disorder 04/14/2007   Essential hypertension 12/09/2006   ALLERGIC RHINITIS 12/09/2006   IBS 12/09/2006    ONSET DATE: 02/25/2023 (referral date)  REFERRING DIAG: I63.9 (ICD-10-CM) - Acute ischemic stroke (HCC)  THERAPY DIAG:  Other abnormalities of gait and mobility  Rationale for Evaluation and Treatment: Rehabilitation  SUBJECTIVE:                                                                                                                                                                                             SUBJECTIVE STATEMENT: Pt reports she bruises very easily, dropped  cardboard on her R foot and is bruised. Pt doesn't feel like she has any stability issues, does have muscle and joint aches but might be side effect of her cholesterol medicine. Pt has already been back to work, moving slower than she is used to.  Pt has noticed some weakness in R hand, has her OT eval scheduled later this week.  Pt accompanied by: self  PERTINENT HISTORY:  PMH significant for hypertension on verapamil, ADD on  stimulants, hormone replacement therapy, irritable bowel syndrome, Raynaud's  PAIN:  Are you having pain? No  Just reports general aches and pains.  PRECAUTIONS: None  RED FLAGS: None   WEIGHT BEARING RESTRICTIONS: No  FALLS: Has patient fallen in last 6 months? No  LIVING ENVIRONMENT: Lives with: lives with their family  PLOF: Independent with gait and Independent with transfers  PATIENT GOALS: N/A  OBJECTIVE:   VITALS Vitals:   03/31/23 0855  BP: (!) 152/96  Pulse: 77     DIAGNOSTIC FINDINGS:  MRI 02/25/23 IMPRESSION: 1. Small focus of acute ischemia within the left basal ganglia. No hemorrhage or mass effect. 2. Findings of chronic small vessel ischemia.  COGNITION: Overall cognitive status: Within functional limits for tasks assessed   SENSATION: WFL   POSTURE: No Significant postural limitations   LOWER EXTREMITY MMT:    MMT Right Eval Left Eval  Hip flexion 5 5  Hip extension    Hip abduction    Hip adduction    Hip internal rotation    Hip external rotation    Knee flexion 5 5  Knee extension 5 5  Ankle dorsiflexion 5 5  Ankle plantarflexion    Ankle inversion    Ankle eversion    (Blank rows = not tested)  BED MOBILITY:  Independent per pt report  TRANSFERS: Assistive device utilized: None  Sit to stand: Complete Independence Stand to sit: Complete Independence Chair to chair: Complete Independence Floor:  not assessed at eval   STAIRS: Level of Assistance: Complete Independence Stair Negotiation  Technique: Alternating Pattern  with No Rails Number of Stairs: 4  Height of Stairs: 6  Comments: WFL  GAIT: Gait pattern: WFL Distance walked: various clinic distances Assistive device utilized: None Level of assistance: Complete Independence Comments: WFL  FUNCTIONAL TESTS:    [facility] PT Assessment - 03/31/23 0856       Ambulation/Gait   Gait velocity 32.8 ft over 7.75 sec = 4.23 ft/sec   no AD, mod I     Standardized Balance Assessment   Standardized Balance Assessment Timed Up and Go Test;Five Times Sit to Stand    Five times sit to stand comments  8.75 sec   hands on thighs     Timed Up and Go Test   TUG Normal TUG    Normal TUG (seconds) 6   no AD     Functional Gait  Assessment   Gait assessed  Yes    Gait Level Surface Walks 20 ft in less than 5.5 sec, no assistive devices, good speed, no evidence for imbalance, normal gait pattern, deviates no more than 6 in outside of the 12 in walkway width.    Change in Gait Speed Able to smoothly change walking speed without loss of balance or gait deviation. Deviate no more than 6 in outside of the 12 in walkway width.    Gait with Horizontal Head Turns Performs head turns smoothly with no change in gait. Deviates no more than 6 in outside 12 in walkway width    Gait with Vertical Head Turns Performs head turns with no change in gait. Deviates no more than 6 in outside 12 in walkway width.    Gait and Pivot Turn Pivot turns safely within 3 sec and stops quickly with no loss of balance.    Step Over Obstacle Is able to step over 2 stacked shoe boxes taped together (9 in total height) without changing gait speed. No evidence of imbalance.  Gait with Narrow Base of Support Is able to ambulate for 10 steps heel to toe with no staggering.    Gait with Eyes Closed Walks 20 ft, no assistive devices, good speed, no evidence of imbalance, normal gait pattern, deviates no more than 6 in outside 12 in walkway width. Ambulates 20 ft in less than  7 sec.    Ambulating Backwards Walks 20 ft, no assistive devices, good speed, no evidence for imbalance, normal gait    Steps Alternating feet, no rail.    Total Score 30    FGA comment: 30/30              TODAY'S TREATMENT:                                                                                                                               PT Evaluation    PATIENT EDUCATION: Education details: Eval findings Person educated: Patient Education method: Explanation Education comprehension: verbalized understanding  HOME EXERCISE PROGRAM: N/A   ASSESSMENT:  CLINICAL IMPRESSION: Patient is a 67 year old female referred to Neuro OPPT for mild L CVA.   Pt's PMH is significant for: hypertension on verapamil, ADD on stimulants, hormone replacement therapy, irritable bowel syndrome, Raynaud's. The following deficits were present during the exam: N/A. Pt scores WFL and within age-related normals on all strength and balance assessments this date, does not currently require skilled PT services. Pt educated that she could benefit from dietician services and that with diet and exercise can work on improved control of her BP and cholesterol to prevent a future stroke.   CLINICAL DECISION MAKING: Stable/uncomplicated  EVALUATION COMPLEXITY: Low  PLAN:  PT FREQUENCY: one time visit     Peter Congo, PT, DPT, CSRS  03/31/2023, 9:11 AM

## 2023-03-31 NOTE — Therapy (Signed)
OUTPATIENT OCCUPATIONAL THERAPY NEURO EVALUATION  Patient Name: [name] MRN: [id number] DOB:[birth year], 67 y.o., female Today's Date: 04/02/2023  PCP: Pincus Sanes, MD REFERRING PROVIDER: Pincus Sanes  END OF SESSION:  OT End of Session - 04/02/23 1042     Visit Number 1    Number of Visits 1    Authorization Type self pay    OT Start Time 0810   pt arrived late   OT Stop Time 0848    OT Time Calculation (min) 38 min    Activity Tolerance Patient tolerated treatment well             Past Medical History:  Diagnosis Date   ADD    ALLERGIC RHINITIS    GERD    HOMOCYSTINEMIA    HYPERTENSION    IBS    Incidental lung nodule 02/2011 ct   unchanged 07/16/11 CT, 6 mo f/u  planned   Past Surgical History:  Procedure Laterality Date   ABDOMINAL HYSTERECTOMY     Bladder tack  2001   Patient Active Problem List   Diagnosis Date Noted   Hyperlipidemia 03/08/2023   History of ischemic stroke 02/25/2023   Hypokalemia 02/25/2023   Prolonged Q-T interval on ECG 02/25/2023   Bilateral upper abdominal discomfort 03/13/2021   Hormone replacement therapy (HRT) 02/09/2018   Incidental lung nodule RUL stable 2012-2016 - no f/u needed 01/08/2012   Eczema 01/08/2012   Attention deficit disorder 04/14/2007   Essential hypertension 12/09/2006   ALLERGIC RHINITIS 12/09/2006   IBS 12/09/2006    ONSET DATE: 02/26/2023 (Referral date)   REFERRING DIAG:  Diagnosis  I63.9 (ICD-10-CM) - Cerebral infarction, unspecified  (LT basal ganglia ischemic CVA)   THERAPY DIAG:  Other lack of coordination  Rationale for Evaluation and Treatment: Rehabilitation  SUBJECTIVE:   SUBJECTIVE STATEMENT: 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 female presenting on 02/24/23 to ED with Rt facial droop and dysarthria. MRI revealed small focus of acute ischemia within the left basal ganglia;  no hemorrhage or mass effect. PMH significant for hypertension on verapamil, ADD on stimulants, hormone replacement therapy, irritable bowel syndrome, Raynaud's.   PRECAUTIONS: None  WEIGHT BEARING RESTRICTIONS: No  PAIN:  Are you having pain?  Generalized from statin meds  FALLS: Has patient fallen in last 6 months? Yes. 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, maintenance at apartments, rental homes) and billing  PATIENT GOALS: get speech better  OBJECTIVE:   HAND DOMINANCE: Right  ADLs: Overall ADLs: independent Transfers/ambulation related to ADLs: independent Eating: independent Grooming: independent UB Dressing: independent LB Dressing: independent Toileting: independent Bathing: independent Tub Shower transfers: independent Equipment: none  IADLs: Shopping: online and delivery for groceries Light housekeeping: independent Meal Prep: independent Community mobility: independent Medication management: independent Landscape architect: independent Handwriting: 100% legible  MOBILITY STATUS: Independent   UPPER EXTREMITY ROM:  BUE AROM WNL's   UPPER EXTREMITY MMT:   BUE MMT 5/5  HAND FUNCTION: Grip strength: Right: 75.1 lbs; Left: 78.7 lbs  COORDINATION: 9 Hole Peg test: Right: 21.65 sec; Left: 20.44 sec  SENSATION: WFL  EDEMA: none, but does report stiffness Rt hand which is getting better  COGNITION: Overall cognitive status: Within functional limits for tasks assessed  VISION: Subjective report: more blurriness but no diplopia - pt will get eyes checked 3 months out from stroke Baseline vision: Wears glasses all the time Visual history:  none  VISION ASSESSMENT: WFL   PERCEPTION: Not  tested  PRAXIS: Not tested  OBSERVATIONS: pt very verbose, pleasant. Noted multiple calluses and cuts on both hands - pt reports this is from the job, not d/t decreased sensation from stroke   TODAY'S TREATMENT:                                                                                                                               N/A   PATIENT EDUCATION: Education details: OT findings and explanation re: no f/u recommended at this time. Pt also instructed to pick up small things in Rt hand for practice (coins, flipping cards) Person educated: Patient Education method: Explanation Education comprehension: verbalized understanding  HOME EXERCISE PROGRAM: N/A   GOALS: N/A  ASSESSMENT:  CLINICAL IMPRESSION: Patient is a 67 y.o. female who was seen today for occupational therapy evaluation s/p Lt small basal ganglia stroke. Pt presents today with no functional deficits and has returned to PLOF including:  all ADLs, driving, and work. Pt reports only remaining deficits are mild Rt hand stiffness, slight blurriness in vision, and slurring of words when rushed or stressed. No f/u O.T. needed at this time. Pt to have eyes checked 3 months out from stroke.   CO-MORBIDITIES: has no other co-morbidities that affects occupational performance. Patient will benefit from skilled OT to address above impairments and improve overall function.  MODIFICATION OR ASSISTANCE TO COMPLETE EVALUATION: No modification of tasks or assist necessary to complete an evaluation.  OT OCCUPATIONAL PROFILE AND HISTORY: Problem focused assessment: Including review of records relating to presenting problem.  CLINICAL DECISION MAKING: LOW - limited treatment options, no task modification necessary  EVALUATION COMPLEXITY: Low    PLAN:  OT FREQUENCY: one time visit  RECOMMENDED OTHER SERVICES: None at this time (pt has already seen P.T. and speech)  CONSULTED AND AGREED WITH PLAN OF CARE: Patient  PLAN  FOR NEXT SESSION: N/A, Evaluation only   Sheran Lawless, OT 04/02/2023, 10:43 AM

## 2023-04-02 ENCOUNTER — Encounter: Payer: Self-pay | Admitting: Occupational Therapy

## 2023-04-02 ENCOUNTER — Ambulatory Visit: Payer: Self-pay | Admitting: Occupational Therapy

## 2023-04-02 DIAGNOSIS — R278 Other lack of coordination: Secondary | ICD-10-CM

## 2023-04-30 ENCOUNTER — Other Ambulatory Visit: Payer: Self-pay | Admitting: Internal Medicine

## 2023-04-30 DIAGNOSIS — F988 Other specified behavioral and emotional disorders with onset usually occurring in childhood and adolescence: Secondary | ICD-10-CM

## 2023-04-30 MED ORDER — AMPHETAMINE-DEXTROAMPHETAMINE 15 MG PO TABS
15.0000 mg | ORAL_TABLET | Freq: Three times a day (TID) | ORAL | 0 refills | Status: DC
Start: 2023-04-30 — End: 2023-05-28

## 2023-05-01 ENCOUNTER — Other Ambulatory Visit: Payer: Self-pay | Admitting: Internal Medicine

## 2023-05-04 ENCOUNTER — Telehealth: Payer: Self-pay | Admitting: Internal Medicine

## 2023-05-04 ENCOUNTER — Other Ambulatory Visit: Payer: Self-pay

## 2023-05-04 MED ORDER — VALACYCLOVIR HCL 1 G PO TABS: 2000.0000 mg | ORAL_TABLET | Freq: Two times a day (BID) | ORAL | 0 refills | Status: AC | PRN

## 2023-05-04 MED ORDER — VALACYCLOVIR HCL 1 G PO TABS: 2000.0000 mg | ORAL_TABLET | Freq: Two times a day (BID) | ORAL | 0 refills | Status: DC | PRN

## 2023-05-04 NOTE — Telephone Encounter (Signed)
Patient called and said her pharmacy does not have valACYclovir (VALTREX) 1000 MG tablet. She would like to know if it can be sent to [facility] on [address] in [place]. Best callback is [phone].

## 2023-05-04 NOTE — Telephone Encounter (Signed)
Refill sent to requested pharmacy.

## 2023-05-28 ENCOUNTER — Other Ambulatory Visit (HOSPITAL_COMMUNITY): Payer: Self-pay | Admitting: Neurology

## 2023-05-28 ENCOUNTER — Other Ambulatory Visit: Payer: Self-pay | Admitting: Internal Medicine

## 2023-05-28 DIAGNOSIS — F988 Other specified behavioral and emotional disorders with onset usually occurring in childhood and adolescence: Secondary | ICD-10-CM

## 2023-05-28 MED ORDER — AMPHETAMINE-DEXTROAMPHETAMINE 15 MG PO TABS
15.0000 mg | ORAL_TABLET | Freq: Three times a day (TID) | ORAL | 0 refills | Status: DC
Start: 2023-05-28 — End: 2023-06-25

## 2023-05-28 MED ORDER — STUDY - OCEANIC-STROKE - ASUNDEXIAN 50 MG OR PLACEBO TABLET (PI-SETHI): 1.0000 | ORAL_TABLET | Freq: Every day | ORAL | 0 refills | Status: DC

## 2023-06-18 ENCOUNTER — Other Ambulatory Visit: Payer: Self-pay | Admitting: Family Medicine

## 2023-06-25 ENCOUNTER — Other Ambulatory Visit: Payer: Self-pay | Admitting: Internal Medicine

## 2023-06-25 DIAGNOSIS — F988 Other specified behavioral and emotional disorders with onset usually occurring in childhood and adolescence: Secondary | ICD-10-CM

## 2023-06-25 MED ORDER — AMPHETAMINE-DEXTROAMPHETAMINE 15 MG PO TABS
15.0000 mg | ORAL_TABLET | Freq: Three times a day (TID) | ORAL | 0 refills | Status: DC
Start: 2023-06-25 — End: 2023-07-23

## 2023-07-23 ENCOUNTER — Other Ambulatory Visit: Payer: Self-pay | Admitting: Internal Medicine

## 2023-07-23 DIAGNOSIS — F988 Other specified behavioral and emotional disorders with onset usually occurring in childhood and adolescence: Secondary | ICD-10-CM

## 2023-07-24 MED ORDER — AMPHETAMINE-DEXTROAMPHETAMINE 15 MG PO TABS: 15.0000 mg | ORAL_TABLET | Freq: Three times a day (TID) | ORAL | 0 refills | Status: DC

## 2023-08-18 ENCOUNTER — Other Ambulatory Visit: Payer: Self-pay | Admitting: Internal Medicine

## 2023-08-18 DIAGNOSIS — F988 Other specified behavioral and emotional disorders with onset usually occurring in childhood and adolescence: Secondary | ICD-10-CM

## 2023-08-19 MED ORDER — ROSUVASTATIN CALCIUM 20 MG PO TABS: 10.0000 mg | ORAL_TABLET | Freq: Every day | ORAL | Status: DC

## 2023-08-20 MED ORDER — AMPHETAMINE-DEXTROAMPHETAMINE 15 MG PO TABS: 15.0000 mg | ORAL_TABLET | Freq: Three times a day (TID) | ORAL | 0 refills | Status: DC

## 2023-08-23 ENCOUNTER — Other Ambulatory Visit: Payer: Self-pay | Admitting: Internal Medicine

## 2023-09-08 ENCOUNTER — Encounter: Payer: Self-pay | Admitting: Internal Medicine

## 2023-09-08 NOTE — Patient Instructions (Addendum)
      Blood work was ordered.       Medications changes include :   None    A referral was ordered and someone will call you to schedule an appointment.     Return in about 6 months (around 03/08/2024) for follow up.

## 2023-09-08 NOTE — Progress Notes (Signed)
      Subjective:    Patient ID: Franceen Inches, female    DOB: September 21, 1955, 68 y.o.   MRN: [id number]     HPI Angalee is here for follow up of her chronic medical problems.    Medications and allergies reviewed with patient and updated if appropriate.  Current Outpatient Medications on File Prior to Visit  Medication Sig Dispense Refill  . aspirin  EC 81 MG tablet Take 1 tablet (81 mg total) by mouth daily for 90 days then as directed by MD. Swallow whole. 120 tablet 0  . lidocaine 4 % Place 1 patch onto the skin as needed (pain).    Aaron Aas loratadine (CLARITIN) 10 MG tablet Take 10 mg by mouth daily.    . polyethylene glycol powder (GLYCOLAX /MIRALAX ) 17 GM/SCOOP powder Take 17 g by mouth 2 (two) times daily as needed. (Patient taking differently: Take 17 g by mouth as needed for mild constipation or moderate constipation.) 3350 g 1  . valACYclovir  (VALTREX ) 1000 MG tablet Take 2 tablets (2,000 mg total) by mouth 2 (two) times daily as needed. 60 tablet 0   No current facility-administered medications on file prior to visit.     Review of Systems     Objective:  There were no vitals filed for this visit. BP Readings from Last 3 Encounters:  10/02/23 (!) 140/80  03/31/23 (!) 152/96  03/25/23 132/88   Wt Readings from Last 3 Encounters:  10/02/23 172 lb (78 kg)  03/25/23 172 lb 12.8 oz (78.4 kg)  03/09/23 167 lb (75.8 kg)   There is no height or weight on file to calculate BMI.    Physical Exam     Lab Results  Component Value Date   WBC 4.9 10/02/2023   HGB 13.8 10/02/2023   HCT 41.5 10/02/2023   PLT 297.0 10/02/2023   GLUCOSE 105 (H) 10/02/2023   CHOL 184 10/02/2023   TRIG 193.0 (H) 10/02/2023   HDL 56.20 10/02/2023   LDLDIRECT 111.7 12/03/2007   LDLCALC 89 10/02/2023   ALT 27 10/02/2023   AST 25 10/02/2023   NA 141 10/02/2023   K 3.3 (L) 10/02/2023   CL 102 10/02/2023   CREATININE 0.69 10/02/2023   BUN 14 10/02/2023   CO2 33 (H) 10/02/2023   TSH 1.56  12/14/2014   INR 0.9 02/24/2023   HGBA1C 5.5 02/25/2023     Assessment & Plan:    See Problem List for Assessment and Plan of chronic medical problems.    This encounter was created in error - please disregard.

## 2023-09-09 ENCOUNTER — Encounter: Payer: Self-pay | Admitting: Internal Medicine

## 2023-09-09 DIAGNOSIS — R41840 Attention and concentration deficit: Secondary | ICD-10-CM

## 2023-09-09 DIAGNOSIS — I10 Essential (primary) hypertension: Secondary | ICD-10-CM

## 2023-09-09 DIAGNOSIS — Z8673 Personal history of transient ischemic attack (TIA), and cerebral infarction without residual deficits: Secondary | ICD-10-CM

## 2023-09-09 DIAGNOSIS — E7849 Other hyperlipidemia: Secondary | ICD-10-CM

## 2023-09-09 NOTE — Assessment & Plan Note (Signed)
Chronic Blood pressure controlled Continue Maxide-25-1 tab daily, valsartan 40 mg daily She will monitor BP at home Discussed goal BP less than 130/80 CBC, CMP

## 2023-09-09 NOTE — Assessment & Plan Note (Addendum)
Chronic CVA 02/2023 Continue aspirin 81 mg daily Continue Crestor 10 mg daily-did not tolerate 20 mg dose In [place] stroke study Check lipids today to make sure LDL is at goal Healthy lifestyle discussed

## 2023-09-09 NOTE — Assessment & Plan Note (Signed)
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

## 2023-09-09 NOTE — Assessment & Plan Note (Signed)
Chronic Controlled Continue Adderall 15 mg 3 times daily

## 2023-09-13 ENCOUNTER — Other Ambulatory Visit: Payer: Self-pay | Admitting: Internal Medicine

## 2023-09-17 ENCOUNTER — Other Ambulatory Visit: Payer: Self-pay | Admitting: Internal Medicine

## 2023-09-17 DIAGNOSIS — F988 Other specified behavioral and emotional disorders with onset usually occurring in childhood and adolescence: Secondary | ICD-10-CM

## 2023-09-17 MED ORDER — AMPHETAMINE-DEXTROAMPHETAMINE 15 MG PO TABS: 15.0000 mg | ORAL_TABLET | Freq: Three times a day (TID) | ORAL | 0 refills | Status: DC

## 2023-09-29 ENCOUNTER — Telehealth: Payer: Self-pay | Admitting: Physician Assistant

## 2023-09-29 DIAGNOSIS — R3989 Other symptoms and signs involving the genitourinary system: Secondary | ICD-10-CM

## 2023-09-29 MED ORDER — CEPHALEXIN 500 MG PO CAPS: 500.0000 mg | ORAL_CAPSULE | Freq: Two times a day (BID) | ORAL | 0 refills | Status: DC

## 2023-09-29 NOTE — Progress Notes (Signed)

## 2023-09-30 ENCOUNTER — Encounter: Payer: Self-pay | Admitting: Internal Medicine

## 2023-09-30 ENCOUNTER — Ambulatory Visit: Payer: Self-pay | Admitting: Internal Medicine

## 2023-09-30 NOTE — Patient Instructions (Addendum)
      Blood work was ordered.       Medications changes include :   stop keflex.  Start nitrofurantoin for UTI.    Start atorvastatin 10 mg daily.  Increase valsartan to 80 mg daily     Return in about 6 months (around 03/31/2024) for follow up.

## 2023-09-30 NOTE — Progress Notes (Unsigned)
Subjective:    Patient ID: [name], female    DOB: 1955/10/02, 68 y.o.   MRN: [id number]     HPI [name] is here for follow up of her chronic medical problems.  2/18 - e-visit for UTI - On keflex - today is day 3 of antibiotics.  Still having symptoms.  Symptoms a little better.    She is off of the study medication for stroke because she was not able to get to the office to get the medication.  She also ran out of Crestor sometime in the last 2-3 weeks.  The body pain that she was experiencing did resolve once she stopped the Crestor-wondered if there was a different option.  Medications and allergies reviewed with patient and updated if appropriate.  Current Outpatient Medications on File Prior to Visit  Medication Sig Dispense Refill   amphetamine-dextroamphetamine (ADDERALL) 15 MG tablet Take 1 tablet by mouth 3 (three) times daily. 90 tablet 0   aspirin EC 81 MG tablet Take 1 tablet (81 mg total) by mouth daily for 90 days then as directed by MD. Swallow whole. 120 tablet 0   lidocaine 4 % Place 1 patch onto the skin as needed (pain).     loratadine (CLARITIN) 10 MG tablet Take 10 mg by mouth daily.     polyethylene glycol powder (GLYCOLAX/MIRALAX) 17 GM/SCOOP powder Take 17 g by mouth 2 (two) times daily as needed. (Patient taking differently: Take 17 g by mouth as needed for mild constipation or moderate constipation.) 3350 g 1   triamterene-hydrochlorothiazide (MAXZIDE-25) 37.5-25 MG tablet TAKE ONE TABLET BY MOUTH ONCE A DAY 90 tablet 0   valACYclovir (VALTREX) 1000 MG tablet Take 2 tablets (2,000 mg total) by mouth 2 (two) times daily as needed. 60 tablet 0   valsartan (DIOVAN) 40 MG tablet TAKE 1 TABLET BY MOUTH EVERY DAY 90 tablet 1   rosuvastatin (CRESTOR) 20 MG tablet Take 0.5 tablets (10 mg total) by mouth daily. (Patient not taking: Reported on 10/02/2023)     No current facility-administered medications on file prior to visit.     Review of Systems   Constitutional:  Negative for fever.  Respiratory:  Negative for cough, shortness of breath and wheezing.   Cardiovascular:  Negative for chest pain, palpitations and leg swelling.  Gastrointestinal:  Positive for abdominal distention and constipation.  Neurological:  Negative for light-headedness and headaches.       Objective:   Vitals:   10/02/23 1452  BP: (!) 140/80  Pulse: 85  Temp: 98.4 F (36.9 C)  SpO2: 99%   BP Readings from Last 3 Encounters:  10/02/23 (!) 140/80  03/31/23 (!) 152/96  03/25/23 132/88   Wt Readings from Last 3 Encounters:  10/02/23 172 lb (78 kg)  03/25/23 172 lb 12.8 oz (78.4 kg)  03/09/23 167 lb (75.8 kg)   Body mass index is 27.76 kg/m.    Physical Exam Constitutional:      General: She is not in acute distress.    Appearance: Normal appearance.  HENT:     Head: Normocephalic and atraumatic.  Eyes:     Conjunctiva/sclera: Conjunctivae normal.  Cardiovascular:     Rate and Rhythm: Normal rate and regular rhythm.     Heart sounds: Normal heart sounds.  Pulmonary:     Effort: Pulmonary effort is normal. No respiratory distress.     Breath sounds: Normal breath sounds. No wheezing.  Musculoskeletal:     Cervical  back: Neck supple.     Right lower leg: No edema.     Left lower leg: No edema.  Lymphadenopathy:     Cervical: No cervical adenopathy.  Skin:    General: Skin is warm and dry.     Findings: No rash.  Neurological:     Mental Status: She is alert. Mental status is at baseline.  Psychiatric:        Mood and Affect: Mood normal.        Behavior: Behavior normal.        Lab Results  Component Value Date   WBC 5.1 02/25/2023   HGB 12.5 02/25/2023   HCT 36.7 02/25/2023   PLT 253 02/25/2023   GLUCOSE 93 02/25/2023   CHOL 195 02/25/2023   TRIG 94 02/25/2023   HDL 66 02/25/2023   LDLDIRECT 111.7 12/03/2007   LDLCALC 110 (H) 02/25/2023   ALT 25 02/25/2023   AST 25 02/25/2023   NA 136 02/25/2023   K 3.4 (L)  02/25/2023   CL 103 02/25/2023   CREATININE 0.73 02/25/2023   BUN 13 02/25/2023   CO2 24 02/25/2023   TSH 1.56 12/14/2014   INR 0.9 02/24/2023   HGBA1C 5.5 02/25/2023     Assessment & Plan:    See Problem List for Assessment and Plan of chronic medical problems.

## 2023-10-02 ENCOUNTER — Ambulatory Visit (INDEPENDENT_AMBULATORY_CARE_PROVIDER_SITE_OTHER): Payer: Self-pay | Admitting: Internal Medicine

## 2023-10-02 VITALS — BP 140/80 | HR 85 | Temp 98.4°F | Ht 66.0 in | Wt 172.0 lb

## 2023-10-02 DIAGNOSIS — I10 Essential (primary) hypertension: Secondary | ICD-10-CM

## 2023-10-02 DIAGNOSIS — N3000 Acute cystitis without hematuria: Secondary | ICD-10-CM

## 2023-10-02 DIAGNOSIS — Z8673 Personal history of transient ischemic attack (TIA), and cerebral infarction without residual deficits: Secondary | ICD-10-CM

## 2023-10-02 DIAGNOSIS — E7849 Other hyperlipidemia: Secondary | ICD-10-CM

## 2023-10-02 DIAGNOSIS — R41840 Attention and concentration deficit: Secondary | ICD-10-CM

## 2023-10-02 LAB — COMPREHENSIVE METABOLIC PANEL
ALT: 27 U/L (ref 0–35)
AST: 25 U/L (ref 0–37)
Albumin: 4.1 g/dL (ref 3.5–5.2)
Alkaline Phosphatase: 77 U/L (ref 39–117)
BUN: 14 mg/dL (ref 6–23)
CO2: 33 meq/L — ABNORMAL HIGH (ref 19–32)
Calcium: 9.3 mg/dL (ref 8.4–10.5)
Chloride: 102 meq/L (ref 96–112)
Creatinine, Ser: 0.69 mg/dL (ref 0.40–1.20)
GFR: 90.87 mL/min (ref >60.00)
Glucose, Bld: 105 mg/dL — ABNORMAL HIGH (ref 70–99)
Potassium: 3.3 meq/L — ABNORMAL LOW (ref 3.5–5.1)
Sodium: 141 meq/L (ref 135–145)
Total Bilirubin: 0.3 mg/dL (ref 0.2–1.2)
Total Protein: 6.8 g/dL (ref 6.0–8.3)

## 2023-10-02 LAB — CBC WITH DIFFERENTIAL/PLATELET
Basophils Absolute: 0 10*3/uL (ref 0.0–0.1)
Basophils Relative: 0.4 % (ref 0.0–3.0)
Eosinophils Absolute: 0.1 10*3/uL (ref 0.0–0.7)
Eosinophils Relative: 1.9 % (ref 0.0–5.0)
HCT: 41.5 % (ref 36.0–46.0)
Hemoglobin: 13.8 g/dL (ref 12.0–15.0)
Lymphocytes Relative: 27.3 % (ref 12.0–46.0)
Lymphs Abs: 1.3 10*3/uL (ref 0.7–4.0)
MCHC: 33.3 g/dL (ref 30.0–36.0)
MCV: 96.2 fL (ref 78.0–100.0)
Monocytes Absolute: 0.4 10*3/uL (ref 0.1–1.0)
Monocytes Relative: 8.6 % (ref 3.0–12.0)
Neutro Abs: 3 10*3/uL (ref 1.4–7.7)
Neutrophils Relative %: 61.8 % (ref 43.0–77.0)
Platelets: 297 10*3/uL (ref 150.0–400.0)
RBC: 4.31 Mil/uL (ref 3.87–5.11)
RDW: 14 % (ref 11.5–15.5)
WBC: 4.9 10*3/uL (ref 4.0–10.5)

## 2023-10-02 LAB — POC URINALSYSI DIPSTICK (AUTOMATED)
Bilirubin, UA: NEGATIVE
Glucose, UA: NEGATIVE
Ketones, UA: NEGATIVE
Nitrite, UA: NEGATIVE
Protein, UA: POSITIVE — AB
Spec Grav, UA: 1.015 (ref 1.010–1.025)
Urobilinogen, UA: 0.2 U/dL
pH, UA: 6 (ref 5.0–8.0)

## 2023-10-02 LAB — LIPID PANEL
Cholesterol: 184 mg/dL (ref 0–200)
HDL: 56.2 mg/dL (ref >39.00)
LDL Cholesterol: 89 mg/dL (ref 0–99)
NonHDL: 128.08
Total CHOL/HDL Ratio: 3
Triglycerides: 193 mg/dL — ABNORMAL HIGH (ref 0.0–149.0)
VLDL: 38.6 mg/dL (ref 0.0–40.0)

## 2023-10-02 MED ORDER — VALSARTAN 80 MG PO TABS: 80.0000 mg | ORAL_TABLET | Freq: Every day | ORAL | 3 refills | Status: AC

## 2023-10-02 MED ORDER — NITROFURANTOIN MONOHYD MACRO 100 MG PO CAPS: 100.0000 mg | ORAL_CAPSULE | Freq: Two times a day (BID) | ORAL | 0 refills | Status: DC

## 2023-10-02 MED ORDER — ATORVASTATIN CALCIUM 10 MG PO TABS: 10.0000 mg | ORAL_TABLET | Freq: Every day | ORAL | 3 refills | Status: DC

## 2023-10-02 NOTE — Assessment & Plan Note (Addendum)
Chronic Blood pressure not ideally controlled Continue Maxide-25-1 tab daily, valsartan 80 mg daily She will monitor BP at home Discussed goal BP less than 130/80 CBC, CMP

## 2023-10-02 NOTE — Assessment & Plan Note (Signed)
Acute Had a virtual visit 2/18 and was started on Keflex-today is day 3 of antibiotics and she still having symptoms UA here is positive for infection-will send for culture Given minimal improvement with Keflex will stop it and switch to nitrofurantoin Discussed that when she has insurance we could consider starting vaginal estrogen which will help prevent further UTIs

## 2023-10-02 NOTE — Assessment & Plan Note (Addendum)
Chronic CVA 02/2023 Continue aspirin 81 mg daily Did not tolerate Crestor 10 mg daily secondary to body aches Will try atorvastatin 10 mg daily-if she does not tolerate that we will try different option No longer in [place] stroke study Check lipids today to make sure LDL is at goal Healthy lifestyle discussed

## 2023-10-02 NOTE — Assessment & Plan Note (Addendum)
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

## 2023-10-02 NOTE — Assessment & Plan Note (Signed)
 Chronic Controlled Continue Adderall 15 mg 3 times daily

## 2023-10-04 ENCOUNTER — Encounter: Payer: Self-pay | Admitting: Internal Medicine

## 2023-10-04 LAB — CULTURE, URINE COMPREHENSIVE: RESULT:: NO GROWTH

## 2023-10-04 MED ORDER — POTASSIUM CHLORIDE ER 10 MEQ PO TBCR: 10.0000 meq | EXTENDED_RELEASE_TABLET | Freq: Every day | ORAL | 3 refills | Status: AC

## 2023-10-04 NOTE — Addendum Note (Signed)
 Addended by: Pincus Sanes on: 10/04/2023 09:47 AM   Modules accepted: Orders

## 2023-10-16 ENCOUNTER — Other Ambulatory Visit: Payer: Self-pay | Admitting: Internal Medicine

## 2023-10-16 DIAGNOSIS — F988 Other specified behavioral and emotional disorders with onset usually occurring in childhood and adolescence: Secondary | ICD-10-CM

## 2023-10-17 MED ORDER — AMPHETAMINE-DEXTROAMPHETAMINE 15 MG PO TABS: 15.0000 mg | ORAL_TABLET | Freq: Three times a day (TID) | ORAL | 0 refills | Status: DC

## 2023-11-10 ENCOUNTER — Other Ambulatory Visit: Payer: Self-pay | Admitting: Internal Medicine

## 2023-11-10 DIAGNOSIS — F988 Other specified behavioral and emotional disorders with onset usually occurring in childhood and adolescence: Secondary | ICD-10-CM

## 2023-11-10 MED ORDER — AMPHETAMINE-DEXTROAMPHETAMINE 15 MG PO TABS: 15.0000 mg | ORAL_TABLET | Freq: Three times a day (TID) | ORAL | 0 refills | Status: DC

## 2023-12-11 ENCOUNTER — Other Ambulatory Visit: Payer: Self-pay | Admitting: Internal Medicine

## 2023-12-11 DIAGNOSIS — F988 Other specified behavioral and emotional disorders with onset usually occurring in childhood and adolescence: Secondary | ICD-10-CM

## 2023-12-11 MED ORDER — AMPHETAMINE-DEXTROAMPHETAMINE 15 MG PO TABS: 15.0000 mg | ORAL_TABLET | Freq: Three times a day (TID) | ORAL | 0 refills | Status: DC

## 2023-12-12 ENCOUNTER — Other Ambulatory Visit: Payer: Self-pay | Admitting: Internal Medicine

## 2024-01-12 ENCOUNTER — Other Ambulatory Visit: Payer: Self-pay | Admitting: Internal Medicine

## 2024-01-12 DIAGNOSIS — F988 Other specified behavioral and emotional disorders with onset usually occurring in childhood and adolescence: Secondary | ICD-10-CM

## 2024-01-12 MED ORDER — AMPHETAMINE-DEXTROAMPHETAMINE 15 MG PO TABS: 15.0000 mg | ORAL_TABLET | Freq: Three times a day (TID) | ORAL | 0 refills | Status: DC

## 2024-01-12 NOTE — Telephone Encounter (Unsigned)
 Copied from CRM [id number]. Topic: Clinical - Medication Refill >> Jan 12, 2024 10:54 AM Luane Rumps D wrote: Medication: amphetamine -dextroamphetamine  (ADDERALL) 15 MG tablet  Has the patient contacted their pharmacy? No (Agent: If no, request that the patient contact the pharmacy for the refill. If patient does not wish to contact the pharmacy document the reason why and proceed with request.) (Agent: If yes, when and what did the pharmacy advise?)  This is the patient's preferred pharmacy:   [id number] [place], [place] - [address] [place] [address] Phone: [phone] Fax: [phone]  Is this the correct pharmacy for this prescription? Yes If no, delete pharmacy and type the correct one.   Has the prescription been filled recently? Yes  Is the patient out of the medication? Yes  Has the patient been seen for an appointment in the last year OR does the patient have an upcoming appointment? Yes  Can we respond through MyChart? Yes  Agent: Please be advised that Rx refills may take up to 3 business days. We ask that you follow-up with your pharmacy.

## 2024-02-09 ENCOUNTER — Other Ambulatory Visit: Payer: Self-pay | Admitting: Internal Medicine

## 2024-02-09 DIAGNOSIS — F988 Other specified behavioral and emotional disorders with onset usually occurring in childhood and adolescence: Secondary | ICD-10-CM

## 2024-02-09 MED ORDER — AMPHETAMINE-DEXTROAMPHETAMINE 15 MG PO TABS: 15.0000 mg | ORAL_TABLET | Freq: Three times a day (TID) | ORAL | 0 refills | Status: DC

## 2024-02-09 NOTE — Telephone Encounter (Unsigned)
 Copied from CRM [id number]. Topic: Clinical - Medication Refill >> Feb 09, 2024  2:58 PM Berneda F wrote: Medication: amphetamine -dextroamphetamine  (ADDERALL) 15 MG tablet  Has the patient contacted their pharmacy? Yes (Agent: If no, request that the patient contact the pharmacy for the refill. If patient does not wish to contact the pharmacy document the reason why and proceed with request.) (Agent: If yes, when and what did the pharmacy advise?)  This is the patient's preferred pharmacy:  [facility] [address], [place] - [address] [place] [address] Phone: [phone] Fax: [phone]  Is this the correct pharmacy for this prescription? Yes If no, delete pharmacy and type the correct one.   Has the prescription been filled recently? No  Is the patient out of the medication? Yes  Has the patient been seen for an appointment in the last year OR does the patient have an upcoming appointment? Yes  Can we respond through MyChart? Yes  Agent: Please be advised that Rx refills may take up to 3 business days. We ask that you follow-up with your pharmacy.

## 2024-02-17 ENCOUNTER — Telehealth: Payer: Self-pay | Admitting: Family

## 2024-02-17 DIAGNOSIS — L255 Unspecified contact dermatitis due to plants, except food: Secondary | ICD-10-CM

## 2024-02-17 MED ORDER — TRIAMCINOLONE ACETONIDE 0.1 % EX CREA: 1.0000 | TOPICAL_CREAM | Freq: Two times a day (BID) | CUTANEOUS | 0 refills | Status: AC

## 2024-02-17 MED ORDER — PREDNISONE 10 MG (21) PO TBPK: ORAL_TABLET | ORAL | 0 refills | Status: DC

## 2024-02-17 NOTE — Progress Notes (Signed)
 E-Visit for [employer]  We are sorry that you are not feeing well.  Here is how we plan to help!  Based on what you have shared with me it looks like you have had an allergic reaction to the oily resin from a group of plants.  This resin is very sticky, so it easily attaches to your skin, clothing, tools equipment, and pet's fur.    This blistering rash is often called poison ivy rash although it can come from contact with the leaves, stems and roots of poison ivy, poison oak and poison sumac.  The oily resin contains urushiol (u-ROO-she-ol) that produces a skin rash on exposed skin.  The severity of the rash depends on the amount of urushiol that gets on your skin.  A section of skin with more urushiol on it may develop a rash sooner.  The rash usually develops 12-48 hours after exposure and can last two to three weeks.  Your skin must come in direct contact with the plant's oil to be affected.  Blister fluid doesn't spread the rash.  However, if you come into contact with a piece of clothing or pet fur that has urushiol on it, the rash may spread out.  You can also transfer the oil to other parts of your body with your fingers.  Often the rash looks like a straight line because of the way the plant brushes against your skin.  Since your rash is widespread or has resulted in a large number of blisters, I have prescribed an oral corticosteroid.  Please follow these recommendations:  I have sent a prednisone  dose pack to your chosen pharmacy. Be sure to follow the instructions carefully and complete the entire prescription. You may use Benadryl or Caladryl topical lotions to sooth the itch and remember cool, not hot, showers and baths can help relieve the itching!  Place cool, wet compresses on the affected area for 15-30 minutes several times a day.  You may also take oral antihistamines, such as diphenhydramine (Benadryl, others), which may also help you sleep better.  Watch your skin for any purulent  (pus) drainage or red streaking from the site.  If this occurs, contact your provider.  You may require an antibiotic for a skin infection.  Make sure that the clothes you were wearing as well as any towels or sheets that may have come in contact with the oil (urushiol) are washed in detergent and hot water.       I have developed the following plan to treat your condition I am prescribing steroids.  and I am prescribing triamcinolone  0.1 % cream -- apply to the affected area(s) in a thin layer, twice daily for up to 14 days. Do not apply to face, privates or armpit regions.    What can you do to prevent this rash?  Avoid the plants.  Learn how to identify poison ivy, poison oak and poison sumac in all seasons.  When hiking or engaging in other activities that might expose you to these plants, try to stay on cleared pathways.  If camping, make sure you pitch your tent in an area free of these plants.  Keep pets from running through wooded areas so that urushiol doesn't accidentally stick to their fur, which you may touch.  Remove or kill the plants.  In your yard, you can get rid of poison ivy by applying an herbicide or pulling it out of the ground, including the roots, while wearing heavy gloves.  Afterward remove the gloves and thoroughly wash them and your hands.  Don't burn poison ivy or related plants because the urushiol can be carried by smoke.  Wear protective clothing.  If needed, protect your skin by wearing socks, boots, pants, long sleeves and vinyl gloves.  Wash your skin right away.  Washing off the oil with soap and water within 30 minutes of exposure may reduce your chances of getting a poison ivy rash.  Even washing after an hour or so can help reduce the severity of the rash.  If you walk through some poison ivy and then later touch your shoes, you may get some urushiol on your hands, which may then transfer to your face or body by touching or rubbing.  If the contaminated object  isn't cleaned, the urushiol on it can still cause a skin reaction years later.    Be careful not to reuse towels after you have washed your skin.  Also carefully wash clothing in detergent and hot water to remove all traces of the oil.  Handle contaminated clothing carefully so you don't transfer the urushiol to yourself, furniture, rugs or appliances.  Remember that pets can carry the oil on their fur and paws.  If you think your pet may be contaminated with urushiol, put on some long rubber gloves and give your pet a bath.  Finally, be careful not to burn these plants as the smoke can contain traces of the oil.  Inhaling the smoke may result in difficulty breathing. If that occurred you should see a physician as soon as possible.  See your doctor right away if:  The reaction is severe or widespread You inhaled the smoke from burning poison ivy and are having difficulty breathing Your skin continues to swell The rash affects your eyes, mouth or genitals Blisters are oozing pus You develop a fever greater than 100 F (37.8 C) The rash doesn't get better within a few weeks.  If you scratch the poison ivy rash, bacteria under your fingernails may cause the skin to become infected.  See your doctor if pus starts oozing from the blisters.  Treatment generally includes antibiotics.  Poison ivy treatments are usually limited to self-care methods.  And the rash typically goes away on its own in two to three weeks.     If the rash is widespread or results in a large number of blisters, your doctor may prescribe an oral corticosteroid, such as prednisone .  If a bacterial infection has developed at the rash site, your doctor may give you a prescription for an oral antibiotic.  MAKE SURE YOU  Understand these instructions. Will watch your condition. Will get help right away if you are not doing well or get worse.   Thank you for choosing an e-visit.  Your e-visit answers were reviewed by a  board certified advanced clinical practitioner to complete your personal care plan. Depending upon the condition, your plan could have included both over the counter or prescription medications.  Please review your pharmacy choice. Make sure the pharmacy is open so you can pick up prescription now. If there is a problem, you may contact your provider through [employer] and have the prescription routed to another pharmacy.  Your safety is important to us . If you have drug allergies check your prescription carefully.   For the next 24 hours you can use MyChart to ask questions about today's visit, request a non-urgent call back, or ask for a work or school excuse.  You will get an email in the next two days asking about your experience. I hope that your e-visit has been valuable and will speed your recovery.   Approximately 5 minutes was spent documenting and reviewing patient's chart.

## 2024-03-09 ENCOUNTER — Other Ambulatory Visit: Payer: Self-pay | Admitting: Internal Medicine

## 2024-03-09 DIAGNOSIS — F988 Other specified behavioral and emotional disorders with onset usually occurring in childhood and adolescence: Secondary | ICD-10-CM

## 2024-03-09 MED ORDER — AMPHETAMINE-DEXTROAMPHETAMINE 15 MG PO TABS
15.0000 mg | ORAL_TABLET | Freq: Three times a day (TID) | ORAL | 0 refills | Status: DC
Start: 2024-03-09 — End: 2024-04-07

## 2024-03-09 NOTE — Telephone Encounter (Unsigned)
 Copied from CRM [id number]. Topic: Clinical - Medication Refill >> Mar 09, 2024  8:19 AM Turkey A wrote: Medication: amphetamine -dextroamphetamine  (ADDERALL) 15 MG tablet  Has the patient contacted their pharmacy? No (Agent: If no, request that the patient contact the pharmacy for the refill. If patient does not wish to contact the pharmacy document the reason why and proceed with request.) (Agent: If yes, when and what did the pharmacy advise?)  This is the patient's preferred pharmacy:   [id number] [place], [place] - [address] [place] [address] Phone: [phone] Fax: [phone]   Is this the correct pharmacy for this prescription? Yes If no, delete pharmacy and type the correct one.   Has the prescription been filled recently? No  Is the patient out of the medication? Yes  Has the patient been seen for an appointment in the last year OR does the patient have an upcoming appointment? Yes  Can we respond through MyChart? Yes  Agent: Please be advised that Rx refills may take up to 3 business days. We ask that you follow-up with your pharmacy.

## 2024-03-09 NOTE — Telephone Encounter (Signed)
 02/09/24 90 tabs/0 RF (30 day)

## 2024-03-13 ENCOUNTER — Other Ambulatory Visit: Payer: Self-pay | Admitting: Internal Medicine

## 2024-04-07 ENCOUNTER — Other Ambulatory Visit: Payer: Self-pay | Admitting: Internal Medicine

## 2024-04-07 DIAGNOSIS — F988 Other specified behavioral and emotional disorders with onset usually occurring in childhood and adolescence: Secondary | ICD-10-CM

## 2024-04-07 MED ORDER — AMPHETAMINE-DEXTROAMPHETAMINE 15 MG PO TABS: 15.0000 mg | ORAL_TABLET | Freq: Three times a day (TID) | ORAL | 0 refills | Status: DC

## 2024-04-07 NOTE — Telephone Encounter (Signed)
 Copied from CRM [id number]. Topic: Clinical - Medication Refill >> Apr 07, 2024 10:39 AM Pinkey ORN wrote: Medication: amphetamine -dextroamphetamine  (ADDERALL) 15 MG tablet  Has the patient contacted their pharmacy? Yes (Agent: If no, request that the patient contact the pharmacy for the refill. If patient does not wish to contact the pharmacy document the reason why and proceed with request.) (Agent: If yes, when and what did the pharmacy advise?)  This is the patient's preferred pharmacy:  [facility] [address] [place] - [address] [place] [address] Phone: [phone] Fax: [phone]   Is this the correct pharmacy for this prescription? Yes If no, delete pharmacy and type the correct one.   Has the prescription been filled recently? No  Is the patient out of the medication? Yes  Has the patient been seen for an appointment in the last year OR does the patient have an upcoming appointment? Yes  Can we respond through MyChart? Yes  Agent: Please be advised that Rx refills may take up to 3 business days. We ask that you follow-up with your pharmacy.

## 2024-05-05 ENCOUNTER — Other Ambulatory Visit: Payer: Self-pay | Admitting: Internal Medicine

## 2024-05-05 DIAGNOSIS — F988 Other specified behavioral and emotional disorders with onset usually occurring in childhood and adolescence: Secondary | ICD-10-CM

## 2024-05-05 NOTE — Telephone Encounter (Unsigned)
 Copied from CRM [id number]. Topic: Clinical - Medication Refill >> May 05, 2024  2:50 PM Thersia C wrote: Medication:amphetamine -dextroamphetamine  (ADDERALL) 15 MG tablet   Has the patient contacted their pharmacy? Yes (Agent: If no, request that the patient contact the pharmacy for the refill. If patient does not wish to contact the pharmacy document the reason why and proceed with request.) (Agent: If yes, when and what did the pharmacy advise?)  This is the patient's preferred pharmacy:  [id number] - [place], [place] - [address] [facility] [place] [address] Phone: [phone] Fax: [phone]  Is this the correct pharmacy for this prescription? Yes If no, delete pharmacy and type the correct one.   Has the prescription been filled recently? No  Is the patient out of the medication? Yes  Has the patient been seen for an appointment in the last year OR does the patient have an upcoming appointment? Yes  Can we respond through MyChart? Yes  Agent: Please be advised that Rx refills may take up to 3 business days. We ask that you follow-up with your pharmacy.

## 2024-05-06 MED ORDER — AMPHETAMINE-DEXTROAMPHETAMINE 15 MG PO TABS: 15.0000 mg | ORAL_TABLET | Freq: Three times a day (TID) | ORAL | 0 refills | Status: DC

## 2024-06-01 ENCOUNTER — Other Ambulatory Visit: Payer: Self-pay | Admitting: Internal Medicine

## 2024-06-01 DIAGNOSIS — F988 Other specified behavioral and emotional disorders with onset usually occurring in childhood and adolescence: Secondary | ICD-10-CM

## 2024-06-01 NOTE — Telephone Encounter (Unsigned)
 Copied from CRM [id number]. Topic: Clinical - Medication Refill >> Jun 01, 2024  8:27 AM Montie POUR wrote: Medication:   amphetamine -dextroamphetamine  (ADDERALL) 15 MG tablet  Has the patient contacted their pharmacy? Yes (Agent: If no, request that the patient contact the pharmacy for the refill. If patient does not wish to contact the pharmacy document the reason why and proceed with request.) (Agent: If yes, when and what did the pharmacy advise?) Pharmacy needs order to refill  This is the patient's preferred pharmacy:  [facility] # [address] [place] - [address] [place] [address] Phone: [phone] Fax: [phone]  Is this the correct pharmacy for this prescription? Yes If no, delete pharmacy and type the correct one.   Has the prescription been filled recently? No  Is the patient out of the medication? No  Has the patient been seen for an appointment in the last year OR does the patient have an upcoming appointment? Yes  Can we respond through MyChart? Yes  Agent: Please be advised that Rx refills may take up to 3 business days. We ask that you follow-up with your pharmacy.

## 2024-06-02 MED ORDER — AMPHETAMINE-DEXTROAMPHETAMINE 15 MG PO TABS: 15.0000 mg | ORAL_TABLET | Freq: Three times a day (TID) | ORAL | 0 refills | Status: DC

## 2024-06-26 ENCOUNTER — Other Ambulatory Visit: Payer: Self-pay | Admitting: Internal Medicine

## 2024-06-30 ENCOUNTER — Telehealth: Payer: Self-pay | Admitting: Internal Medicine

## 2024-06-30 DIAGNOSIS — F988 Other specified behavioral and emotional disorders with onset usually occurring in childhood and adolescence: Secondary | ICD-10-CM

## 2024-06-30 MED ORDER — AMPHETAMINE-DEXTROAMPHETAMINE 15 MG PO TABS: 15.0000 mg | ORAL_TABLET | Freq: Three times a day (TID) | ORAL | 0 refills | Status: DC

## 2024-06-30 NOTE — Telephone Encounter (Signed)
 Copied from CRM [id number]. Topic: Clinical - Medication Refill >> Jun 30, 2024  4:21 PM Alfonso HERO wrote: Medication: amphetamine -dextroamphetamine  (ADDERALL) 15 MG tablet  Has the patient contacted their pharmacy? Yes (Agent: If no, request that the patient contact the pharmacy for the refill. If patient does not wish to contact the pharmacy document the reason why and proceed with request.) (Agent: If yes, when and what did the pharmacy advise?)  This is the patient's preferred pharmacy:  [facility] # [address], [place] - [address] [place] [address] Phone: [phone] Fax: [phone]   Is this the correct pharmacy for this prescription? Yes If no, delete pharmacy and type the correct one.   Has the prescription been filled recently? Yes  Is the patient out of the medication? Yes  Has the patient been seen for an appointment in the last year OR does the patient have an upcoming appointment? Yes  Can we respond through MyChart? Yes  Agent: Please be advised that Rx refills may take up to 3 business days. We ask that you follow-up with your pharmacy.

## 2024-07-14 ENCOUNTER — Emergency Department (HOSPITAL_COMMUNITY)
Admission: EM | Admit: 2024-07-14 | Discharge: 2024-07-14 | Disposition: A | Discharge status: Home / Self Care | Attending: Emergency Medicine | Admitting: Emergency Medicine

## 2024-07-14 ENCOUNTER — Other Ambulatory Visit: Payer: Self-pay

## 2024-07-14 ENCOUNTER — Emergency Department (HOSPITAL_COMMUNITY)

## 2024-07-14 ENCOUNTER — Ambulatory Visit: Payer: Self-pay | Admitting: Internal Medicine

## 2024-07-14 ENCOUNTER — Encounter (HOSPITAL_COMMUNITY): Payer: Self-pay

## 2024-07-14 DIAGNOSIS — W312XXA Contact with powered woodworking and forming machines, initial encounter: Secondary | ICD-10-CM | POA: Insufficient documentation

## 2024-07-14 DIAGNOSIS — Z79899 Other long term (current) drug therapy: Secondary | ICD-10-CM | POA: Insufficient documentation

## 2024-07-14 DIAGNOSIS — Z8673 Personal history of transient ischemic attack (TIA), and cerebral infarction without residual deficits: Secondary | ICD-10-CM | POA: Insufficient documentation

## 2024-07-14 DIAGNOSIS — I10 Essential (primary) hypertension: Secondary | ICD-10-CM | POA: Diagnosis not present

## 2024-07-14 DIAGNOSIS — Z23 Encounter for immunization: Secondary | ICD-10-CM | POA: Diagnosis not present

## 2024-07-14 DIAGNOSIS — Z7982 Long term (current) use of aspirin: Secondary | ICD-10-CM | POA: Insufficient documentation

## 2024-07-14 DIAGNOSIS — S6992XA Unspecified injury of left wrist, hand and finger(s), initial encounter: Secondary | ICD-10-CM | POA: Diagnosis present

## 2024-07-14 DIAGNOSIS — S62631B Displaced fracture of distal phalanx of left index finger, initial encounter for open fracture: Secondary | ICD-10-CM | POA: Diagnosis not present

## 2024-07-14 LAB — CBC WITH DIFFERENTIAL/PLATELET
Abs Immature Granulocytes: 0.03 K/uL (ref 0.00–0.07)
Basophils Absolute: 0 K/uL (ref 0.0–0.1)
Basophils Relative: 0 %
Eosinophils Absolute: 0.2 K/uL (ref 0.0–0.5)
Eosinophils Relative: 4 %
HCT: 36.8 % (ref 36.0–46.0)
Hemoglobin: 12.6 g/dL (ref 12.0–15.0)
Immature Granulocytes: 1 %
Lymphocytes Relative: 20 %
Lymphs Abs: 1.1 K/uL (ref 0.7–4.0)
MCH: 31.9 pg (ref 26.0–34.0)
MCHC: 34.2 g/dL (ref 30.0–36.0)
MCV: 93.2 fL (ref 80.0–100.0)
Monocytes Absolute: 0.6 K/uL (ref 0.1–1.0)
Monocytes Relative: 10 %
Neutro Abs: 3.6 K/uL (ref 1.7–7.7)
Neutrophils Relative %: 65 %
Platelets: 260 K/uL (ref 150–400)
RBC: 3.95 MIL/uL (ref 3.87–5.11)
RDW: 13.2 % (ref 11.5–15.5)
WBC: 5.5 K/uL (ref 4.0–10.5)
nRBC: 0 % (ref 0.0–0.2)

## 2024-07-14 LAB — BASIC METABOLIC PANEL WITH GFR
Anion gap: 8 (ref 5–15)
BUN: 18 mg/dL (ref 8–23)
CO2: 25 mmol/L (ref 22–32)
Calcium: 9.1 mg/dL (ref 8.9–10.3)
Chloride: 106 mmol/L (ref 98–111)
Creatinine, Ser: 0.78 mg/dL (ref 0.44–1.00)
GFR, Estimated: >60 mL/min (ref >60)
Glucose, Bld: 113 mg/dL — ABNORMAL HIGH (ref 70–99)
Potassium: 3.5 mmol/L (ref 3.5–5.1)
Sodium: 139 mmol/L (ref 135–145)

## 2024-07-14 LAB — CBG MONITORING, ED: Glucose-Capillary: 112 mg/dL — ABNORMAL HIGH (ref 70–99)

## 2024-07-14 MED ORDER — MORPHINE SULFATE 15 MG PO TABS: 7.5000 mg | ORAL_TABLET | ORAL | 0 refills | Status: AC | PRN

## 2024-07-14 MED ORDER — CEPHALEXIN 500 MG PO CAPS: 500.0000 mg | ORAL_CAPSULE | Freq: Four times a day (QID) | ORAL | 0 refills | Status: DC

## 2024-07-14 MED ORDER — MORPHINE SULFATE 15 MG PO TABS: 7.5000 mg | ORAL_TABLET | ORAL | 0 refills | Status: DC | PRN

## 2024-07-14 MED ORDER — CEFAZOLIN SODIUM-DEXTROSE 2-4 GM/100ML-% IV SOLN
2.0000 g | Freq: Once | INTRAVENOUS | Status: AC
  Administered 2024-07-14: 2 g via INTRAVENOUS

## 2024-07-14 MED ORDER — MORPHINE SULFATE (PF) 4 MG/ML IV SOLN
4.0000 mg | Freq: Once | INTRAVENOUS | Status: AC
  Administered 2024-07-14: 4 mg via INTRAVENOUS
  Filled 2024-07-14: qty 1

## 2024-07-14 MED ORDER — LIDOCAINE HCL (PF) 1 % IJ SOLN
5.0000 mL | Freq: Once | INTRAMUSCULAR | Status: AC
  Administered 2024-07-14: 5 mL
  Filled 2024-07-14: qty 5

## 2024-07-14 MED ORDER — LACTATED RINGERS IV BOLUS
1000.0000 mL | Freq: Once | INTRAVENOUS | Status: AC
  Administered 2024-07-14: 1000 mL via INTRAVENOUS

## 2024-07-14 MED ORDER — TETANUS-DIPHTH-ACELL PERTUSSIS 5-2-15.5 LF-MCG/0.5 IM SUSP
0.5000 mL | Freq: Once | INTRAMUSCULAR | Status: AC
  Administered 2024-07-14: 0.5 mL via INTRAMUSCULAR
  Filled 2024-07-14: qty 0.5

## 2024-07-14 MED ORDER — CEPHALEXIN 500 MG PO CAPS: 500.0000 mg | ORAL_CAPSULE | Freq: Four times a day (QID) | ORAL | 0 refills | Status: AC

## 2024-07-14 MED ORDER — FENTANYL CITRATE (PF) 50 MCG/ML IJ SOSY
50.0000 ug | PREFILLED_SYRINGE | Freq: Once | INTRAMUSCULAR | Status: AC
  Administered 2024-07-14: 50 ug via INTRAVENOUS
  Filled 2024-07-14: qty 1

## 2024-07-14 NOTE — Discharge Instructions (Addendum)
 They were letting us  evaluate you today.  We have given you IV pain medicine, fluids, updated your tetanus, IV antibiotics to prevent infection.  I have sent a prescription for antibiotic to ensure you remain infection free.  I also sent narcotic pain medicine for pain. You may take tylenol , ibuprofen on top of this for pain as needed  Return to ED for evaluation if it gets very swollen, purulent pus like drainage, fever wo other cause, worsening symptoms

## 2024-07-14 NOTE — ED Provider Triage Note (Signed)
 Emergency Medicine Provider Triage Evaluation Note  [name] , a 69 y.o. female  was evaluated in triage.  Pt complains of finer laceration using saw.  Tip of finger is cool and dusky.  No sensation.  Hemostatic right now.  Review of Systems  Positive:  Negative:   Physical Exam  Ht 5' 6 (1.676 m)   Wt 74.8 kg   BMI 26.63 kg/m  Gen:   Awake, no distress   Resp:  Normal effort  MSK:   Moves extremities without difficulty  Other:  Tip of finger is cool and dusky.  No sensation.  Hemostatic.  Compartments are soft.  Have a radial pulse.  Concern for open fracture  Medical Decision Making  Medically screening exam initiated at 4:42 PM.  Appropriate orders placed.  RAYCHELL HOLCOMB was informed that the remainder of the evaluation will be completed by another provider, this initial triage assessment does not replace that evaluation, and the importance of remaining in the ED until their evaluation is complete.  Concern for open fracture.  Patient was started on Ancef.  She is being room to 33 currently.   Bernis Ernst, [place] 07/14/24 1644

## 2024-07-14 NOTE — ED Provider Notes (Signed)
 [place] EMERGENCY DEPARTMENT AT [facility] Provider Note   CSN: [id number] Arrival date & time: 07/14/24  1534     Patient presents with: Finger Injury   [name] is a 68 y.o. female with a past medical history of ADHD, HTN, CVA, prolonged QT, HLD presents Emergency Department for evaluation of left finger injury.  Reports that she was cutting with a skill saw when her hand slipped and finger was cut.  Last tetanus 10 years ago.  Denies any additional complaints, complaints prior to injury.   HPI     Prior to Admission medications   Medication Sig Start Date End Date Taking? Authorizing Provider  amphetamine -dextroamphetamine  (ADDERALL) 15 MG tablet Take 1 tablet by mouth 3 (three) times daily. 06/30/24   Geofm Glade PARAS, MD  aspirin  EC 81 MG tablet Take 1 tablet (81 mg total) by mouth daily for 90 days then as directed by MD. Swallow whole. 02/26/23   Bryn Bernardino NOVAK, MD  atorvastatin  (LIPITOR) 10 MG tablet Take 1 tablet (10 mg total) by mouth daily. 10/02/23   Geofm Glade PARAS, MD  cephALEXin  (KEFLEX ) 500 MG capsule Take 1 capsule (500 mg total) by mouth 4 (four) times daily for 7 days. 07/14/24 07/21/24  Emil Share, DO  lidocaine  4 % Place 1 patch onto the skin as needed (pain).    [provider]  loratadine (CLARITIN) 10 MG tablet Take 10 mg by mouth daily.    [provider]  morphine  (MSIR) 15 MG tablet Take 0.5 tablets (7.5 mg total) by mouth every 4 (four) hours as needed. 07/14/24   Emil Share, DO  nitrofurantoin , macrocrystal-monohydrate, (MACROBID ) 100 MG capsule Take 1 capsule (100 mg total) by mouth 2 (two) times daily. 10/02/23   Geofm Glade PARAS, MD  polyethylene glycol powder (GLYCOLAX /MIRALAX ) 17 GM/SCOOP powder Take 17 g by mouth 2 (two) times daily as needed. Patient taking differently: Take 17 g by mouth as needed for mild constipation or moderate constipation. 03/13/21   Geofm Glade PARAS, MD  potassium chloride  (KLOR-CON  10) 10 MEQ tablet  Take 1 tablet (10 mEq total) by mouth daily. 10/04/23   Geofm Glade PARAS, MD  predniSONE  (STERAPRED UNI-PAK 21 TAB) 10 MG (21) TBPK tablet Use as directed 02/17/24   Lavell Lye A, FNP  triamcinolone  cream (KENALOG ) 0.1 % Apply 1 Application topically 2 (two) times daily. 02/17/24   Lavell Lye LABOR, FNP  triamterene -hydrochlorothiazide (MAXZIDE-25) 37.5-25 MG tablet TAKE ONE TABLET BY MOUTH ONCE A DAY 06/27/24   Burns, Glade PARAS, MD  valACYclovir  (VALTREX ) 1000 MG tablet Take 2 tablets (2,000 mg total) by mouth 2 (two) times daily as needed. 05/04/23   Geofm Glade PARAS, MD  valsartan  (DIOVAN ) 80 MG tablet Take 1 tablet (80 mg total) by mouth daily. 10/02/23   Geofm Glade PARAS, MD    Allergies: Nifedipine and Shellfish allergy    Review of Systems  Skin:  Positive for color change and wound.    Updated Vital Signs BP (!) 141/83   Pulse 80   Temp 98.1 F (36.7 C) (Oral)   Resp 17   Ht 5' 6 (1.676 m)   Wt 74.8 kg   SpO2 94%   BMI 26.63 kg/m   Physical Exam Vitals and nursing note reviewed.  Constitutional:      General: She is not in acute distress.    Appearance: Normal appearance.  HENT:     Head: Normocephalic and atraumatic.  Eyes:  Conjunctiva/sclera: Conjunctivae normal.  Cardiovascular:     Rate and Rhythm: Normal rate.     Pulses:          Radial pulses are 2+ on the right side and 2+ on the left side.  Pulmonary:     Effort: Pulmonary effort is normal. No respiratory distress.     Breath sounds: Normal breath sounds.  Musculoskeletal:     Left hand: Laceration present.     Comments: Sensation 0/2 of skin distal to laceration. Jagged laceration to palmar aspect of left index finger. Discoloration of distal tip of left index finger. Flexion of DIP intact. No damage to nail bed.  Skin:    Coloration: Skin is not jaundiced or pale.  Neurological:     Mental Status: She is alert and oriented to person, place, and time. Mental status is at baseline.            (all  labs ordered are listed, but only abnormal results are displayed) Labs Reviewed  BASIC METABOLIC PANEL WITH GFR - Abnormal; Notable for the following components:      Result Value   Glucose, Bld 113 (*)    All other components within normal limits  CBG MONITORING, ED - Abnormal; Notable for the following components:   Glucose-Capillary 112 (*)    All other components within normal limits  CBC WITH DIFFERENTIAL/PLATELET    EKG: None  Radiology: DG Finger Index Left Result Date: 07/14/2024 CLINICAL DATA:  Provided history: cut with skills saw EXAM: LEFT INDEX FINGER 2+V COMPARISON:  None Available. FINDINGS: Comminuted fracture of the distal phalanx, mildly displaced and angulated. Fracture approaches the articular surface at the radial aspect. Proximal digit is intact. Soft tissue edema and skin irregularity adjacent to the fracture site. No radiopaque foreign body. IMPRESSION: Comminuted fracture of the distal phalanx, mildly displaced and angulated. Fracture approaches the articular surface at the radial aspect. Electronically Signed   By: Andrea Gasman M.D.   On: 07/14/2024 16:57     .Laceration Repair  Date/Time: 07/14/2024 6:45 PM  Performed by: Minnie Tinnie BRAVO, PA Authorized by: Minnie Tinnie BRAVO, PA   Consent:    Consent obtained:  Verbal   Consent given by:  Patient   Risks, benefits, and alternatives were discussed: yes     Risks discussed:  Infection, pain, poor cosmetic result, poor wound healing, need for additional repair and nerve damage Universal protocol:    Procedure explained and questions answered to patient or proxy's satisfaction: yes     Relevant documents present and verified: yes     Imaging studies available: yes     Patient identity confirmed:  Verbally with patient and arm band Anesthesia:    Anesthesia method:  Nerve block   Block location:  MCP   Block needle gauge:  27 G   Block anesthetic:  Lidocaine  1% w/o epi   Block technique:  MCP   Block  injection procedure:  Anatomic landmarks identified, anatomic landmarks palpated, introduced needle, negative aspiration for blood and incremental injection   Block outcome:  Anesthesia achieved Laceration details:    Location:  Finger   Finger location:  L index finger   Length (cm):  2 Exploration:    Contaminated: no   Treatment:    Area cleansed with:  Povidone-iodine, saline and Shur-Clens   Amount of cleaning:  Standard   Irrigation solution:  Sterile saline   Irrigation volume:  1000 Skin repair:    Repair method:  Sutures  Suture size:  5-0   Suture material:  Plain gut   Suture technique:  Simple interrupted   Number of sutures:  3 Approximation:    Approximation:  Loose Repair type:    Repair type:  Simple Post-procedure details:    Dressing:  Antibiotic ointment and non-adherent dressing   Procedure completion:  Tolerated well, no immediate complications    Medications Ordered in the ED  ceFAZolin  (ANCEF ) IVPB 2g/100 mL premix (0 g Intravenous Stopped 07/14/24 1936)  lactated ringers  bolus 1,000 mL (0 mLs Intravenous Stopped 07/14/24 1936)  morphine  (PF) 4 MG/ML injection 4 mg (4 mg Intravenous Given 07/14/24 1700)  Tdap (ADACEL ) injection 0.5 mL (0.5 mLs Intramuscular Given 07/14/24 1702)  lidocaine  (PF) (XYLOCAINE ) 1 % injection 5 mL (5 mLs Other Given 07/14/24 1810)  fentaNYL  (SUBLIMAZE ) injection 50 mcg (50 mcg Intravenous Given 07/14/24 1810)                                    Medical Decision Making Amount and/or Complexity of Data Reviewed Radiology: ordered.  Risk Prescription drug management.   Patient presents to the ED for concern of finger injury, this involves an extensive number of treatment options, and is a complaint that carries with it a high risk of complications and morbidity.  The differential diagnosis includes open fx, dislocation, soft tissue injury, inx   Co morbidities that complicate the patient evaluation  none   Additional  history obtained:  Additional history obtained from Nursing   External records from outside source obtained and reviewed including triage RN note   Lab Tests:  I Ordered, and personally interpreted labs.  The pertinent results include:   Cbg 112   Imaging Studies ordered:  I ordered imaging studies including left index finger XR  I independently visualized and interpreted imaging which showed  Comminuted fracture of the distal phalanx, mildly displaced and angulated. Fracture approaches the articular surface at the radial aspect I agree with the radiologist interpretation     Medicines ordered and prescription drug management:  I ordered medication including morphine , tdap, ancef   for open fx, tetanus update, pain  Reevaluation of the patient after these medicines showed that the patient improved I have reviewed the patients home medicines and have made adjustments as needed    Consultations Obtained:  I requested consultation with hand surgery PA Emil,  and discussed lab and imaging findings as well as pertinent plan. They reviewed images in chart, XR images - they recommend:  Outpatient f/u Keflex  for infection prophylaxis Loose approximation with sutures   Problem List / ED Course:  Displaced fracture of distal phalanx of left index finger, initial encounter for open fracture Provided morphine , fentanyl  for pain Ancef  for open fracture Updated tetanus as patient's last Tdap was 10 years ago X-ray notable for open displaced fracture. Cleaned wound with saline, Clurs sens, iodine Loosely approximated laceration Phalanx distal to laceration is cool to touch and has no sensation when assessed. Radial pulse 2+. Moving all other digits wo difficulty. Brisk cap refill to all other digits.  No other injuries found on PE nor reported by pt Wrapped laceration with bacitracin, nonadhesive dressing, kling. Discussed importance of continuously checking sensation of proximal  aspect of phalanx to ensure no decreased perfusion Provided analgesia and keflex  prescription Provided hand surgery f/u for pt to call and make appointment Strict return precautions to include sx of compartment syndrome, infection  provided to patient and on DC paperwork   Reevaluation:  After the interventions noted above, I reevaluated the patient and found that they have :improved     Dispostion:  After consideration of the diagnostic results and the patients response to treatment, I feel that the patent would benefit from outpatient management with hand surgery follow-up.   Discussed ED workup, disposition, return to ED precautions with patient who expresses understanding agrees with plan.  All questions answered to their satisfaction.  They are agreeable to plan.  Discharge instructions provided on paperwork  Final diagnoses:  Displaced fracture of distal phalanx of left index finger, initial encounter for open fracture    ED Discharge Orders          Ordered    cephALEXin  (KEFLEX ) 500 MG capsule  4 times daily,   Status:  Discontinued        07/14/24 1854    morphine  (MSIR) 15 MG tablet  Every 4 hours PRN,   Status:  Discontinued        07/14/24 1856    cephALEXin  (KEFLEX ) 500 MG capsule  4 times daily        07/14/24 1934    morphine  (MSIR) 15 MG tablet  Every 4 hours PRN        07/14/24 1934             Minnie Tinnie BRAVO, PA 07/15/24 0017    Emil Share, DO 07/15/24 1924

## 2024-07-14 NOTE — ED Triage Notes (Signed)
 Pt arrives POV due to cutting finger with skill saw.  Pt thinks it might be amputated and states that it is just hanging on.  Pt is holding pressure to cloth wrapped around finger.  Bleeding controlled at this time. Last tetanus shot unknown

## 2024-07-17 ENCOUNTER — Encounter: Payer: Self-pay | Admitting: Internal Medicine

## 2024-07-17 NOTE — Patient Instructions (Addendum)
      Blood work was ordered.   Have this done in about 2 months - just go to the lab downstairs.      Medications changes include :   zetia  10 mg daily for your cholesterol      Return in about 6 months (around 01/16/2025) for follow up.

## 2024-07-17 NOTE — Progress Notes (Unsigned)
 Subjective:    Patient ID: [name], female    DOB: 1955/08/18, 68 y.o.   MRN: [id number]     HPI [name] is here for follow up of her chronic medical problems.  Lacerated left index finger - almost cut it off - for surgery tomorrow to see if he can save the end of her finger.      Did not tolerate atorvastatin  and stopped it - muscle aches.   Needs to lose weight - she is very active.  She has been eating more fast food over the past few years.   Medications and allergies reviewed with patient and updated if appropriate.  Current Outpatient Medications on File Prior to Visit  Medication Sig Dispense Refill   amphetamine -dextroamphetamine  (ADDERALL) 15 MG tablet Take 1 tablet by mouth 3 (three) times daily. 90 tablet 0   aspirin  EC 81 MG tablet Take 1 tablet (81 mg total) by mouth daily for 90 days then as directed by MD. Swallow whole. 120 tablet 0   cephALEXin  (KEFLEX ) 500 MG capsule Take 1 capsule (500 mg total) by mouth 4 (four) times daily for 7 days. 28 capsule 0   lidocaine  4 % Place 1 patch onto the skin as needed (pain).     loratadine (CLARITIN) 10 MG tablet Take 10 mg by mouth daily.     morphine  (MSIR) 15 MG tablet Take 0.5 tablets (7.5 mg total) by mouth every 4 (four) hours as needed. 5 tablet 0   polyethylene glycol powder (GLYCOLAX /MIRALAX ) 17 GM/SCOOP powder Take 17 g by mouth 2 (two) times daily as needed. (Patient taking differently: Take 17 g by mouth as needed for mild constipation or moderate constipation.) 3350 g 1   potassium chloride  (KLOR-CON  10) 10 MEQ tablet Take 1 tablet (10 mEq total) by mouth daily. 90 tablet 3   triamcinolone  cream (KENALOG ) 0.1 % Apply 1 Application topically 2 (two) times daily. 30 g 0   triamterene -hydrochlorothiazide (MAXZIDE-25) 37.5-25 MG tablet TAKE ONE TABLET BY MOUTH ONCE A DAY 90 tablet 0   valACYclovir  (VALTREX ) 1000 MG tablet Take 2 tablets (2,000 mg total) by mouth 2 (two) times daily as needed. 60 tablet 0    valsartan  (DIOVAN ) 80 MG tablet Take 1 tablet (80 mg total) by mouth daily. 90 tablet 3   No current facility-administered medications on file prior to visit.     Review of Systems  Constitutional:  Negative for fever.  Respiratory:  Negative for cough, shortness of breath and wheezing.   Cardiovascular:  Negative for chest pain, palpitations and leg swelling.  Neurological:  Negative for light-headedness and headaches.       Objective:   Vitals:   07/18/24 0859  BP: 138/80  Pulse: 94  Temp: 98.1 F (36.7 C)  SpO2: 98%   BP Readings from Last 3 Encounters:  07/18/24 138/80  07/14/24 (!) 141/83  10/02/23 (!) 140/80   Wt Readings from Last 3 Encounters:  07/18/24 175 lb (79.4 kg)  07/14/24 165 lb (74.8 kg)  10/02/23 172 lb (78 kg)   Body mass index is 28.25 kg/m.    Physical Exam Constitutional:      General: She is not in acute distress.    Appearance: Normal appearance.  HENT:     Head: Normocephalic and atraumatic.  Eyes:     Conjunctiva/sclera: Conjunctivae normal.  Cardiovascular:     Rate and Rhythm: Normal rate and regular rhythm.     Heart sounds: Normal  heart sounds.  Pulmonary:     Effort: Pulmonary effort is normal. No respiratory distress.     Breath sounds: Normal breath sounds. No wheezing.  Musculoskeletal:     Cervical back: Neck supple.     Right lower leg: No edema.     Left lower leg: No edema.  Lymphadenopathy:     Cervical: No cervical adenopathy.  Skin:    General: Skin is warm and dry.     Findings: No rash.  Neurological:     Mental Status: She is alert. Mental status is at baseline.  Psychiatric:        Mood and Affect: Mood normal.        Behavior: Behavior normal.        Lab Results  Component Value Date   WBC 5.5 07/14/2024   HGB 12.6 07/14/2024   HCT 36.8 07/14/2024   PLT 260 07/14/2024   GLUCOSE 113 (H) 07/14/2024   CHOL 184 10/02/2023   TRIG 193.0 (H) 10/02/2023   HDL 56.20 10/02/2023   LDLDIRECT 111.7  12/03/2007   LDLCALC 89 10/02/2023   ALT 27 10/02/2023   AST 25 10/02/2023   NA 139 07/14/2024   K 3.5 07/14/2024   CL 106 07/14/2024   CREATININE 0.78 07/14/2024   BUN 18 07/14/2024   CO2 25 07/14/2024   TSH 1.56 12/14/2014   INR 0.9 02/24/2023   HGBA1C 5.5 02/25/2023     Assessment & Plan:    See Problem List for Assessment and Plan of chronic medical problems.

## 2024-07-18 ENCOUNTER — Ambulatory Visit: Payer: Self-pay | Admitting: Internal Medicine

## 2024-07-18 VITALS — BP 138/80 | HR 94 | Temp 98.1°F | Ht 66.0 in | Wt 175.0 lb

## 2024-07-18 DIAGNOSIS — Z8673 Personal history of transient ischemic attack (TIA), and cerebral infarction without residual deficits: Secondary | ICD-10-CM

## 2024-07-18 DIAGNOSIS — I10 Essential (primary) hypertension: Secondary | ICD-10-CM

## 2024-07-18 DIAGNOSIS — E7800 Pure hypercholesterolemia, unspecified: Secondary | ICD-10-CM

## 2024-07-18 DIAGNOSIS — G720 Drug-induced myopathy: Secondary | ICD-10-CM | POA: Insufficient documentation

## 2024-07-18 DIAGNOSIS — R41840 Attention and concentration deficit: Secondary | ICD-10-CM

## 2024-07-18 MED ORDER — EZETIMIBE 10 MG PO TABS: 10.0000 mg | ORAL_TABLET | Freq: Every day | ORAL | 3 refills | Status: AC

## 2024-07-18 NOTE — Assessment & Plan Note (Signed)
 Chronic Controlled Continue Adderall 15 mg 3 times daily

## 2024-07-18 NOTE — Assessment & Plan Note (Signed)
 Chronic Has not tolerated crestor , lipitor  Will try zetia 

## 2024-07-18 NOTE — Assessment & Plan Note (Addendum)
 Chronic CVA 02/2023 Continue aspirin  81 mg daily Did not tolerate Crestor  and atorvastatin  secondary to body aches Start zetia  10 mg daily Lipids, cmp in 2 months Healthy lifestyle discussed

## 2024-07-18 NOTE — Assessment & Plan Note (Addendum)
 Chronic History of ischemic CVA Did not tolerate crestor  or lipitor  Start zetia  10 mg daily Check CMP, lipid panel in 2 months She continues to be very active Healthy diet stressed

## 2024-07-18 NOTE — Assessment & Plan Note (Addendum)
 Chronic Blood pressure borderline high, bu ok Continue Maxide-25-1 tab daily, valsartan  80 mg daily Monitor BP at home Discussed goal BP less than 130/80

## 2024-07-27 ENCOUNTER — Other Ambulatory Visit: Payer: Self-pay | Admitting: Internal Medicine

## 2024-07-27 DIAGNOSIS — F988 Other specified behavioral and emotional disorders with onset usually occurring in childhood and adolescence: Secondary | ICD-10-CM

## 2024-07-27 NOTE — Telephone Encounter (Unsigned)
 Copied from CRM #[id number]. Topic: Clinical - Medication Refill >> Jul 27, 2024  9:07 AM Carlyon D wrote: Medication:   amphetamine -dextroamphetamine  (ADDERALL) 15 MG tablet  Has the patient contacted their pharmacy? No (Agent: If no, request that the patient contact the pharmacy for the refill. If patient does not wish to contact the pharmacy document the reason why and proceed with request.) (Agent: If yes, when and what did the pharmacy advise?)  This is the patient's preferred pharmacy:  [facility] # [address], [place] - [address] [place] [address] Phone: [phone] Fax: [phone]   Is this the correct pharmacy for this prescription? Yes If no, delete pharmacy and type the correct one.   Has the prescription been filled recently? No  Is the patient out of the medication? Yes  Has the patient been seen for an appointment in the last year OR does the patient have an upcoming appointment? Yes  Can we respond through MyChart? Yes  Agent: Please be advised that Rx refills may take up to 3 business days. We ask that you follow-up with your pharmacy.

## 2024-07-29 MED ORDER — AMPHETAMINE-DEXTROAMPHETAMINE 15 MG PO TABS: 15.0000 mg | ORAL_TABLET | Freq: Three times a day (TID) | ORAL | 0 refills | Status: DC

## 2024-08-19 ENCOUNTER — Ambulatory Visit: Payer: Self-pay

## 2024-08-19 NOTE — Telephone Encounter (Signed)
 FYI Only or Action Required?: Action required by provider: clinical question for provider and update on patient condition.  Patient was last seen in primary care on 07/18/2024 by Geofm Glade PARAS, MD.  Called Nurse Triage reporting Cough, Wheezing, Sinusitis, and Shortness of Breath.  Symptoms began 3 weeks ago with sinus/cold like symptoms; cough and SOB x 1 week on and off.  Interventions attempted: Prescription medications: She states she was on an antibiotic for a few weeks due to finger surgery.  Symptoms are: gradually worsening.  Triage Disposition: See HCP Within 4 Hours (Or PCP Triage)  Patient/caregiver understands and will follow disposition?: No, wishes to speak with PCP              Copied from CRM #[id number]. Topic: Clinical - Red Word Triage >> Aug 19, 2024 10:08 AM Deleta RAMAN wrote: Red Word that prompted transfer to Nurse Triage: pt calling due to possible pneumonia requesting chest xray but does not want to come in office. Trouble breathing when coughing. no fever. tight chest also Reason for Disposition  [1] MILD difficulty breathing (e.g., minimal/no SOB at rest, SOB with walking, pulse < 100) AND [2] NEW-onset or WORSE than normal  Answer Assessment - Initial Assessment Questions Patient states symptoms started about 3 weeks ago: Cold and sinus symptoms. Sinus drainage, cough. She states no  COVID or flu like symptoms. Patient declining office visit or going to urgent care due to not wanting to expose herself to any other illnesses. She states Dr Geofm knows her well and will be able to review the symptoms and determine if she needs an xray.    1. RESPIRATORY STATUS: Describe your breathing? (e.g., wheezing, shortness of breath, unable to speak, severe coughing)      SOB, chest tightness but describes that is feels like wheezing, dry cough.  2. ONSET: When did this breathing problem begin?    X 1 week.   3. PATTERN Does the difficult breathing come  and go, or has it been constant since it started?      Comes and goes.  4. SEVERITY: How bad is your breathing? (e.g., mild, moderate, severe)      Patient reports she thinks moderate due to SOB at rest if coughing. More mild per symptoms reported.  5. RECURRENT SYMPTOM: Have you had difficulty breathing before? If Yes, ask: When was the last time? and What happened that time?      She states most likely but can't remember.  6. CARDIAC HISTORY: Do you have any history of heart disease? (e.g., heart attack, angina, bypass surgery, angioplasty)      No.  7. LUNG HISTORY: Do you have any history of lung disease?  (e.g., pulmonary embolus, asthma, emphysema)     No.  8. CAUSE: What do you think is causing the breathing problem?      Unsure, requesting chest x ray.  9. OTHER SYMPTOMS: Do you have any other symptoms? (e.g., chest pain, cough, dizziness, fever, runny nose)     No fever, sore throat  10. O2 SATURATION MONITOR:  Do you use an oxygen saturation monitor (pulse oximeter) at home? If Yes, ask: What is your reading (oxygen level) today? What is your usual oxygen saturation reading? (e.g., 95%)       Lowest was 92%; 97-98% this morning.  11. PREGNANCY: Is there any chance you are pregnant? When was your last menstrual period?       N/A.  12. TRAVEL: Have you traveled out  of the country in the last month? (e.g., travel history, exposures)       No.  Protocols used: Breathing Difficulty-A-AH

## 2024-08-22 NOTE — Telephone Encounter (Signed)
 Message left for patient that she needs appointment to be seen based on symptoms as well as chest xray being ordered.  If she calls back please schedule appointment.

## 2024-08-26 ENCOUNTER — Telehealth: Payer: Self-pay

## 2024-08-26 DIAGNOSIS — F988 Other specified behavioral and emotional disorders with onset usually occurring in childhood and adolescence: Secondary | ICD-10-CM

## 2024-08-26 MED ORDER — AMPHETAMINE-DEXTROAMPHETAMINE 15 MG PO TABS: 15.0000 mg | ORAL_TABLET | Freq: Three times a day (TID) | ORAL | 0 refills | Status: AC

## 2024-08-26 NOTE — Telephone Encounter (Signed)
 Copied from CRM [id number]. Topic: Clinical - Medication Refill >> Aug 26, 2024  9:09 AM Deleta RAMAN wrote: Medication: amphetamine -dextroamphetamine  (ADDERALL) 15 MG tablet   Has the patient contacted their pharmacy? No (Agent: If no, request that the patient contact the pharmacy for the refill. If patient does not wish to contact the pharmacy document the reason why and proceed with request.) (Agent: If yes, when and what did the pharmacy advise?)  This is the patient's preferred pharmacy:  [facility] # [address] [place] - [address] [place] [address] Phone: [phone] Fax: [phone]   Is this the correct pharmacy for this prescription? Yes If no, delete pharmacy and type the correct one.   Has the prescription been filled recently? Yes  Is the patient out of the medication? Yes  Has the patient been seen for an appointment in the last year OR does the patient have an upcoming appointment? Yes  Can we respond through MyChart? Yes  Agent: Please be advised that Rx refills may take up to 3 business days. We ask that you follow-up with your pharmacy.

## 2024-08-26 NOTE — Addendum Note (Signed)
 Addended by: GEOFM GLADE PARAS on: 08/26/2024 04:36 PM   Modules accepted: Orders

## 2024-11-25 ENCOUNTER — Ambulatory Visit: Admitting: Rheumatology

## 2025-01-12 ENCOUNTER — Ambulatory Visit: Admitting: Rheumatology
